# Patient Record
Sex: Male | Born: 1985 | Race: Black or African American | Hispanic: No | Marital: Married | Smoking: Never smoker
Health system: Southern US, Community
[De-identification: ages and names within clinical notes are randomized; demographics above are authoritative.]

## PROBLEM LIST (undated history)

## (undated) DIAGNOSIS — E7800 Pure hypercholesterolemia, unspecified: Secondary | ICD-10-CM

## (undated) DIAGNOSIS — E119 Type 2 diabetes mellitus without complications: Secondary | ICD-10-CM

## (undated) DIAGNOSIS — I2510 Atherosclerotic heart disease of native coronary artery without angina pectoris: Secondary | ICD-10-CM

## (undated) DIAGNOSIS — I10 Essential (primary) hypertension: Secondary | ICD-10-CM

---
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
## 2010-06-19 ENCOUNTER — Inpatient Hospital Stay (HOSPITAL_COMMUNITY)
Admission: EM | Admit: 2010-06-19 | Discharge: 2010-06-21 | Payer: Self-pay | Source: Home / Self Care | Attending: Internal Medicine | Admitting: Internal Medicine

## 2010-06-20 ENCOUNTER — Encounter (INDEPENDENT_AMBULATORY_CARE_PROVIDER_SITE_OTHER): Payer: Self-pay | Admitting: Internal Medicine

## 2010-06-29 LAB — HEPATIC FUNCTION PANEL
ALT: 28 U/L (ref 0–53)
AST: 36 U/L (ref 0–37)
Albumin: 3.9 g/dL (ref 3.5–5.2)
Alkaline Phosphatase: 77 U/L (ref 39–117)
Bilirubin, Direct: 0.3 mg/dL (ref 0.0–0.3)
Indirect Bilirubin: 0.8 mg/dL (ref 0.3–0.9)
Total Bilirubin: 1.1 mg/dL (ref 0.3–1.2)
Total Protein: 6.7 g/dL (ref 6.0–8.3)

## 2010-06-29 LAB — POCT I-STAT, CHEM 8
BUN: 22 mg/dL (ref 6–23)
Calcium, Ion: 1.22 mmol/L (ref 1.12–1.32)
Chloride: 109 mEq/L (ref 96–112)
Creatinine, Ser: 0.8 mg/dL (ref 0.4–1.5)
Glucose, Bld: 119 mg/dL — ABNORMAL HIGH (ref 70–99)
HCT: 41 % (ref 39.0–52.0)
Hemoglobin: 13.9 g/dL (ref 13.0–17.0)
Potassium: 3.9 mEq/L (ref 3.5–5.1)
Sodium: 141 mEq/L (ref 135–145)
TCO2: 29 mmol/L (ref 0–100)

## 2010-06-29 LAB — CBC
HCT: 39 % (ref 39.0–52.0)
HCT: 41.2 % (ref 39.0–52.0)
Hemoglobin: 13.3 g/dL (ref 13.0–17.0)
Hemoglobin: 13.6 g/dL (ref 13.0–17.0)
MCH: 27.8 pg (ref 26.0–34.0)
MCH: 28.7 pg (ref 26.0–34.0)
MCHC: 33 g/dL (ref 30.0–36.0)
MCHC: 34.1 g/dL (ref 30.0–36.0)
MCV: 84.2 fL (ref 78.0–100.0)
MCV: 84.3 fL (ref 78.0–100.0)
Platelets: 177 10*3/uL (ref 150–400)
Platelets: 194 10*3/uL (ref 150–400)
RBC: 4.63 MIL/uL (ref 4.22–5.81)
RBC: 4.89 MIL/uL (ref 4.22–5.81)
RDW: 13 % (ref 11.5–15.5)
RDW: 13 % (ref 11.5–15.5)
WBC: 7.8 10*3/uL (ref 4.0–10.5)
WBC: 8.2 10*3/uL (ref 4.0–10.5)

## 2010-06-29 LAB — COMPREHENSIVE METABOLIC PANEL
ALT: 25 U/L (ref 0–53)
ALT: 24 U/L (ref 0–53)
AST: 24 U/L (ref 0–37)
AST: 22 U/L (ref 0–37)
Albumin: 3.9 g/dL (ref 3.5–5.2)
Albumin: 3.3 g/dL — ABNORMAL LOW (ref 3.5–5.2)
Alkaline Phosphatase: 66 U/L (ref 39–117)
Alkaline Phosphatase: 66 U/L (ref 39–117)
BUN: 20 mg/dL (ref 6–23)
BUN: 15 mg/dL (ref 6–23)
CO2: 25 mEq/L (ref 19–32)
CO2: 26 mEq/L (ref 19–32)
Calcium: 9.1 mg/dL (ref 8.4–10.5)
Calcium: 9.3 mg/dL (ref 8.4–10.5)
Chloride: 104 mEq/L (ref 96–112)
Chloride: 106 mEq/L (ref 96–112)
Creatinine, Ser: 0.8 mg/dL (ref 0.4–1.5)
Creatinine, Ser: 0.8 mg/dL (ref 0.4–1.5)
GFR calc Af Amer: >60 mL/min (ref >60)
GFR calc Af Amer: >60 mL/min (ref >60)
GFR calc non Af Amer: >60 mL/min (ref >60)
GFR calc non Af Amer: >60 mL/min (ref >60)
Glucose, Bld: 103 mg/dL — ABNORMAL HIGH (ref 70–99)
Glucose, Bld: 140 mg/dL — ABNORMAL HIGH (ref 70–99)
Potassium: 4.1 mEq/L (ref 3.5–5.1)
Potassium: 4 mEq/L (ref 3.5–5.1)
Sodium: 138 mEq/L (ref 135–145)
Sodium: 140 mEq/L (ref 135–145)
Total Bilirubin: 0.5 mg/dL (ref 0.3–1.2)
Total Bilirubin: 1.1 mg/dL (ref 0.3–1.2)
Total Protein: 6.9 g/dL (ref 6.0–8.3)
Total Protein: 6.2 g/dL (ref 6.0–8.3)

## 2010-06-29 LAB — DIFFERENTIAL
Basophils Absolute: 0 10*3/uL (ref 0.0–0.1)
Basophils Relative: 1 % (ref 0–1)
Eosinophils Absolute: 0.1 10*3/uL (ref 0.0–0.7)
Eosinophils Relative: 1 % (ref 0–5)
Lymphocytes Relative: 38 % (ref 12–46)
Lymphs Abs: 3.1 10*3/uL (ref 0.7–4.0)
Monocytes Absolute: 0.7 10*3/uL (ref 0.1–1.0)
Monocytes Relative: 9 % (ref 3–12)
Neutro Abs: 4.2 10*3/uL (ref 1.7–7.7)
Neutrophils Relative %: 52 % (ref 43–77)

## 2010-06-29 LAB — LIPID PANEL
Cholesterol: 213 mg/dL — ABNORMAL HIGH (ref 0–200)
HDL: 30 mg/dL — ABNORMAL LOW (ref >39)
LDL Cholesterol: UNDETERMINED mg/dL (ref 0–99)
Total CHOL/HDL Ratio: 7.1 RATIO
Triglycerides: 535 mg/dL — ABNORMAL HIGH (ref <150)
VLDL: UNDETERMINED mg/dL (ref 0–40)

## 2010-06-29 LAB — POCT CARDIAC MARKERS
CKMB, poc: 3.6 ng/mL (ref 1.0–8.0)
Myoglobin, poc: 38.1 ng/mL (ref 12–200)
Troponin i, poc: <0.05 ng/mL (ref 0.00–0.09)

## 2010-06-29 LAB — CK TOTAL AND CKMB (NOT AT ARMC)
CK, MB: 5.5 ng/mL — ABNORMAL HIGH (ref 0.3–4.0)
CK, MB: 4.5 ng/mL — ABNORMAL HIGH (ref 0.3–4.0)
CK, MB: 4 ng/mL (ref 0.3–4.0)
Relative Index: 3.2 — ABNORMAL HIGH (ref 0.0–2.5)
Relative Index: 3.3 — ABNORMAL HIGH (ref 0.0–2.5)
Relative Index: 3.3 — ABNORMAL HIGH (ref 0.0–2.5)
Total CK: 173 U/L (ref 7–232)
Total CK: 138 U/L (ref 7–232)
Total CK: 120 U/L (ref 7–232)

## 2010-06-29 LAB — GLUCOSE, CAPILLARY
Glucose-Capillary: 123 mg/dL — ABNORMAL HIGH (ref 70–99)
Glucose-Capillary: 138 mg/dL — ABNORMAL HIGH (ref 70–99)
Glucose-Capillary: 151 mg/dL — ABNORMAL HIGH (ref 70–99)
Glucose-Capillary: 163 mg/dL — ABNORMAL HIGH (ref 70–99)
Glucose-Capillary: 199 mg/dL — ABNORMAL HIGH (ref 70–99)
Glucose-Capillary: 135 mg/dL — ABNORMAL HIGH (ref 70–99)

## 2010-06-29 LAB — TROPONIN I
Troponin I: 0.02 ng/mL (ref 0.00–0.06)
Troponin I: 0.02 ng/mL (ref 0.00–0.06)
Troponin I: 0.03 ng/mL (ref 0.00–0.06)

## 2010-06-29 LAB — RAPID URINE DRUG SCREEN, HOSP PERFORMED
Amphetamines: NOT DETECTED
Barbiturates: NOT DETECTED
Benzodiazepines: NOT DETECTED
Cocaine: NOT DETECTED
Opiates: NOT DETECTED
Tetrahydrocannabinol: NOT DETECTED

## 2010-06-29 LAB — ETHANOL: Alcohol, Ethyl (B): <5 mg/dL (ref 0–10)

## 2010-06-29 LAB — PROTIME-INR
INR: 0.99 (ref 0.00–1.49)
Prothrombin Time: 13.3 seconds (ref 11.6–15.2)

## 2010-06-29 LAB — D-DIMER, QUANTITATIVE: D-Dimer, Quant: <0.22 ug/mL-FEU (ref 0.00–0.48)

## 2010-06-29 LAB — APTT: aPTT: 28 seconds (ref 24–37)

## 2010-07-03 NOTE — Discharge Summary (Signed)
  [name], KLINGERMAN NO.:  [id number]  MEDICAL RECORD NO.:  [id number]          PATIENT TYPE:  INP  LOCATION:  2011                         FACILITY:  [facility]  PHYSICIAN:  Sarajane Jews, MD     DATE OF BIRTH:  12/13/1985  DATE OF ADMISSION:  06/19/2010 DATE OF DISCHARGE:  06/21/2010                              DISCHARGE SUMMARY   PRIMARY CARE PHYSICIAN:  Unassigned  REASON FOR ADMISSION:  Chest pain.  DISCHARGE DIAGNOSES: 1. Chest pain, myocardial infarction was ruled out. 2. Obesity. 3. Hypercholesterolemia. 4. Hypertriglyceridemia. 5. Diabetes type 2.  DISCHARGE MEDICATION: 1. Aspirin 81 mg daily. 2. TriCor 145 mg daily. 3. Simvastatin 20 daily. 4. Metformin 500 p.o. b.i.d. 5. Glipizide 10 mg daily.  RADIOLOGY:  Chest x-ray, no active lung disease.  Two-D echo shows left ventricular cavity size was normal.  There was mild concentric hypertrophy.  Systolic function was normal, 50-65%.  Aortic valve is a normal valve.  Aorta was normal.  CONSULTS:  Cardiology consult was done with Dr. Jana Half. DeGent.  HOSPITAL COURSE:  This is a 25 year old male with history of diabetes type 2 who came with left-sided chest pain that started around 7-7:30 on the day of admission, lasted for 4 hours.  The patient was given nitro. EKG was normal sinus rhythm.  Cardiac enzymes x3 were negative.  Drug screen was negative.  The patient does have a family history that his father had coronary artery bypass at 67.  The patient was admitted to rule out.  He was seen by Cardiology.  He refused to stay one more day for inpatient stress test.  The patient was discharged home with outpatient stress test.  PERTINENT LABORATORY DATA:  His sodium was 140, potassium was 4, chloride 106, bicarb 76, and glucose 140.  Cholesterol is 213, triglycerides 535.  HDL was 30.  Urine screen was negative.  Alcohol was less than 5.  DIET:  Two-gram sodium.  DISPOSITION:   Home.  INSTRUCTIONS:  Followup stress test as an outpatient.          ______________________________ Sarajane Jews, MD     SA/MEDQ  D:  06/21/2010  T:  06/22/2010  Job:  [id number]  Electronically Signed by Sarajane Jews MD on 07/03/2010 11:49:44 AM

## 2010-07-10 NOTE — H&P (Signed)
[name], SHILL NO.:  [id number]  MEDICAL RECORD NO.:  [id number]          PATIENT TYPE:  INP  LOCATION:  [place]                         FACILITY:  [facility]  PHYSICIAN:  Arlyss Repress, MD        DATE OF BIRTH:  1986/02/11  DATE OF ADMISSION:  06/19/2010 DATE OF DISCHARGE:                             HISTORY & PHYSICAL   CHIEF COMPLAINT:  Chest pain.  HISTORY OF PRESENT ILLNESS:  A 25 year old male with a history of diabetes type 1, apparently complains of chest pain that started about 7 p.m.  He described the pain as tightness with radiation to the left arm associated with palpitations and shortness of breath.  The patient denies any fever, chills, cough, nausea, vomiting, heartburn, diarrhea. The patient has no prior history of chest pain.  Pain self went away after several hours.  We were asked by the ED to evaluate the patient for his chest pain.  In light of the fact that he does have a family history of heart disease in his father who had bypass surgery at age 35, and was a nonsmoker.  The patient, however, is not certain that he is willing to stay.  EKG showed normal sinus rhythm at 98, normal axis, poor R-wave progression, nonspecific T-wave changes.  There was no prior EKG for comparison.  Admission orders returned in; however, I am not sure if the patient is going to stay as stated above.  PAST MEDICAL HISTORY:  Type 1 diabetes.  PAST SURGICAL HISTORY:  None.  SOCIAL HISTORY:  The patient is married and has children.  He does not smoke or drink.  He does not do drugs.  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  FAMILY HISTORY:  Mother is alive at age 27 and healthy.  Father is alive at age 67 and has a history of bypass surgery at age 47 and is a nonsmoker.  ALLERGIES:  No known drug allergies.  MEDICATIONS:  Glipizide and metformin.  REVIEW OF SYSTEMS:  Negative for all 10-organ systems except for pertinent positives as stated  above.  PHYSICAL EXAMINATION:  VITAL SIGNS:  Temperature 98.1, pulse 101, blood pressure is 140/92, pulse ox is 100% on room air. HEENT:  Anicteric, EOMI, no nystagmus, pupils 1.5 mm, symmetric, direct consensual, near reflex intact.  Mucous membranes moist. NECK:  No JVD.  No bruit. HEART:  Regular rate and rhythm.  S1-S2.  No murmurs, gallops or rubs. LUNGS:  Clear to auscultation bilaterally. ABDOMEN:  Soft, nontender, nondistended.  Positive bowel sounds. EXTREMITIES:  No cyanosis, clubbing or edema. SKIN:  No rashes. LYMPH NODES:  No adenopathy. NEUROLOGIC:  Nonfocal, cranial nerves II through XII intact.  Reflexes 2+, symmetric, diffuse with downgoing toes bilaterally, motor strength 5/5 in all four extremities, pinprick intact.  LABORATORY DATA:  BUN 22, creatinine 0.8.  WBC 8.2, hemoglobin 13.6, platelet count 194.  Troponin-I less than 0.05.  Urine drug screen negative.  Alcohol level less than 5.  AST 36, ALT 28.  INR 0.99.  PTT 28.  Chest x-ray negative for any acute process.  ASSESSMENT AND PLAN: 1. Chest pain:  We will check a D-dimer since he was initially     somewhat tachycardic and if positive, then obtain a CTA of chest.     Otherwise, we will cycle cardiac markers q.6 h. x3 sets and     consider stress testing in a.m. either as an inpatient or as an     outpatient. 2. Type 1 diabetes:  Check fingerstick blood sugars a.c. and at     bedtime, cover with NovoLog sensitive sliding scale. 3. Deep venous thrombosis prophylaxis.  Lovenox.     Arlyss Repress, MD     JYK/MEDQ  D:  06/20/2010  T:  06/20/2010  Job:  [id number]  Electronically Signed by Jani Gravel MD on 07/10/2010 11:53:36 PM

## 2010-08-13 NOTE — Consult Note (Signed)
[name], [name] NO.:  [id number]  MEDICAL RECORD NO.:  [id number]          PATIENT TYPE:  INP  LOCATION:  2011                         FACILITY:  [facility]  PHYSICIAN:  Ernestine Mcmurray, MD,FACC DATE OF BIRTH:  Nov 14, 1985  DATE OF CONSULTATION:  06/20/2010 DATE OF DISCHARGE:                                CONSULTATION   REASON FOR CONSULTATION:  Evaluation of chest pain in a 25 year old male with diabetes mellitus type 2.  HISTORY OF PRESENT ILLNESS:  The patient is a 25 year old male with history of diabetes type 2 on glipizide and metformin.  The patient has a strong family history of coronary artery disease.  The patient yesterday reported an episode while driving of severe substernal chest pain.  He states the pain started around 7 or 7:30 and lasted for 4 hours until he came to the emergency room.  He was not given nitroglycerin in the emergency room, although his electrocardiogram was within normal limits. He also stated that the pain also somewhat radiating into the left arm. Again, the pain resolved spontaneously after 4 hours and there were no associated EKG changes per report of the H and P on this patient, the EKG is not available in the chart.  The patient states that several days ago he played basketball with his friends and had no exertional chest pain.  Today, he has been essentially pain free, although there have been a couple of episodes where he reports a 1/10 chest pressure, although not on exertion.  He has been able to walk in the halls without any difficulties.  The patient's cardiac enzymes have been within normal limits, although the third set is still pending.  The relative index was slightly elevated.  Troponins were negative x2 at 0.02.  The patient's drug screen was also negative.  The patient as outlined above does have a strong family history of coronary artery disease, his father had bypass at age 14.  PAST MEDICAL HISTORY:   Type 2 diabetes mellitus.  PAST SURGICAL HISTORY:  None.  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  Does not smoke or drink.  He does not use any drugs.  He is studying free biology.  FAMILY HISTORY:  Mother is alive at age 70 and healthy.  Father is alive at age 88 and has a history of bypass surgery at age 100, and is a nonsmoker.  ALLERGIES:  No known drug allergies.  MEDICATIONS:  Glipizide and metformin.  REVIEW OF SYSTEMS:  Pertinent positives as outlined above.  The remainder of the 18-point review of systems was otherwise negative.  PHYSICAL EXAMINATION:  VITAL SIGNS:  Blood pressure is 133/80 with a heart rate of 82 beats per minute, temperature is 97.5, saturation 98% on room air. GENERAL:  Well-nourished African American male in no distress. HEENT:  PERRLA.  EOMI.  Oropharynx is clear. NECK:  Supple.  Normal carotid upstroke bilaterally.  No carotid bruits or thyromegaly.  No nodular thyroid. LUNGS:  Clear breath sounds bilaterally. HEART:  Regular rate and rhythm, normal S1 and S2.  No  murmur, rubs, or gallops. ABDOMEN:  Soft, nontender.  No rebound or guarding.  Good bowel sounds. EXTREMITIES:  No cyanosis, clubbing, or edema. NEURO:  The patient is alert and oriented.  Grossly nonfocal. SKIN:  Warm and dry. VASCULAR:  2+ peripheral pulses.  Femoral pulses, dorsalis pedis, and posterior tibial pulses. LYMPHATICS:  Within normal limits. PSYCHIATRIC:  Normal affect.  LABORATORY WORK:  White count 7.8, hemoglobin 13.3, hematocrit 39, platelet count is 177.  Coagulation panel is within normal limits.  Sodium is 138, potassium is 4.1, chloride is 104, CO2 is 25, glucose is 103, BUN is 20, creatinine is 0.8.  Liver function tests are within normal limits.  Troponins as outlined above.  12-lead echocardiogram not in chart, but confirming no acute ischemic changes.  PROBLEMS: 1. Substernal chest pain with typical and atypical features. 2.  Multiple cardiac risk factors including,     a.     Strong family history of coronary artery disease.     b.     Type 2 diabetes mellitus.     c.     Negative cardiac enzyme markers and negative EKG.     d.     Unknown lipid status.  PLAN: 1. The patient's chest pain is somewhat concerning because there are     some typical features including the heavy substernal chest pain,     although it lasted for 4 hours and there were no troponin     elevation.  There was some radiation to the left arm.  The     patient's pretest probability is increased, but he is still a     relatively young at age 35. 71. At this point, I do think that the patient likelihood of     significant coronary artery disease is intermediate.  He would     benefit from stress testing and I have ordered a stress Myoview     study for tomorrow.  Apparently the Hospitalist Service has already     ordered an echocardiogram and we will review this later. 3. The patient needs aggressive risk factor modification and if that     his lipid status determined and could benefit     from statin known drug therapy pending the results. 4. The patient is not willing to stay, but I told him that it would be     important that we get the stress test done as an inpatient rather     than as an outpatient.     Ernestine Mcmurray, MD,FACC     GED/MEDQ  D:  06/20/2010  T:  06/21/2010  Job:  [id number]  cc:   Arlyss Repress, MD  Electronically Signed by Terald Sleeper MDFACC on 08/13/2010 10:25:45 AM

## 2010-10-30 NOTE — Discharge Summary (Signed)
[place]. [facility]  Patient:    [name], [name]                         MRN: [id number] Adm. Date:  [id number] Disc. Date: [id number] Attending:  Zigmund Gottron DictatorBenjamine Mola CC:         Dr. at [facility] along with H. pylori test fax # [phone]  Earnstine Regal, M.D.   Discharge Summary  DATE OF BIRTH:  February 07, 1961  PRIMARY DIAGNOSIS:  Chronic pancreatitis.  SECONDARY DIAGNOSES: 1. Pancreatic pseudocyst. 2. Hematemesis. 3. Tobacco abuse. 4. History of peptic ulcer disease. 5. Dehydration. 6. Malnutrition.  HISTORY OF PRESENT ILLNESS:  This 25 year old African-American male with a history of chronic pancreatitis and pancreatic pseudocyst presents complaining of severe abdominal pain on the evening of admission.  The patient was using Percocet 2.5/325 dose at home, and taking two pills every six to eight hours which was not relieving the pain.  The patient states that he has appointment with Dr. Harlow Asa and is to be considering surgery to be scheduled at Dr. Tera Helper recommendation dependent on a CT scan which was scheduled for March 22, 2000.  HOSPITAL COURSE:  The patient was admitted with presumptive acute pancreatitis, although amylase and lipase were in the normal range.  The patient had pain control with morphine PCA to which he responded well.  The patient did have some itching with the morphine, however, Benadryl relieved this itching.  Also on admission the patient was found to be dehydrated.  He was given IV fluids to improve this status, and started a clear liquid diet on day 3 of hospitalization, and progressed to taking p.o. well on day of discharge.  The patient had also presented with a history of peptic ulcer disease, and complained of hematemesis x 1 prior to his presentation to the ER.  The patient was treated with Phenergan and kept n.p.o., and had no further vomiting or hematemesis during hospitalization.   The patient was also given Protonix at 40 mg IV b.i.d.  The patient was found to have chronic malnutrition with a prealbumin of 10.4, albumin of 2.9.  Ensure supplements were suggested and given b.i.d. while he was in the hospital.  CONSULTATIONS:  General surgery, one of the partners of Dr. Harlow Asa consulted during this hospitalization as Dr. Harlow Asa (his primary surgeon) was on vacation.  He repeated the CT scan to evaluate pancreatic pseudocyst.  SIGNIFICANT LABORATORY DATA:  Decreased albumin and prealbumin as mentioned above.  PROCEDURES:  CT scan with a pancreatic pseudocyst size 8 x 13.7 cm, which has reportedly increased in size compared to the previous CT scan in the middle of August.  CONDITION ON DISCHARGE:  The patient was discharged home in a stable condition.  DISCHARGE INSTRUCTIONS: 1. The patient is instructed to follow up with primary doctor, Dr. ______ at    [facility] on Thursday, March 17, 2000, at 2 p.m. 2. Follow up with Dr. Harlow Asa on March 22, 2000, as scheduled.  DISCHARGE MEDICATIONS: 1. Percocet 7.5/500 one or two pills q.6-8h. p.r.n. pain. 2. Prilosec as used at home 20 mg p.o. q.d. 3. Phenergan 12.5 mg p.o. q.4-6h. p.r.n. nausea.  DIET:  Instructions to supplement with Ensure t.i.d.  LABORATORIES PENDING AT THIS TIME:  Serum H. pylori screen. DD:  03/16/00 TD:  03/17/00 Job: [id number]

## 2010-10-30 NOTE — Discharge Summary (Signed)
[place]. [facility]  Patient:    [name], [name]                         MRN: [id number] Adm. Date:  [id number] Disc. Date: [id number] Attending:  Zigmund Gottron Dictator:   Oleh Genin, D.O.                           Discharge Summary  CHIEF COMPLAINT:  Sent for high blood sugars.  REFERRING PHYSICIAN:  Unassigned.  The patient came from [facility].  HISTORY OF PRESENT ILLNESS:  The patient is a 25 year old black male sent at the request of Dr. Rosana Hoes of [facility] after discovering that the patients blood sugars were extremely high.  The patient had presented to [facility] on April 11, 2000, with a chief complaint of polydipsia, polyuria, and fatigue for two to three weeks.  The patient had previously been health without any medical problems.  The patient denies any weight loss or weight gain recently.  The patient denies night sweats and fevers.  Immunizations are up to date.  PAST MEDICAL HISTORY:  None.  PAST SURGICAL HISTORY:  None.  ALLERGIES:  No known drug allergies.  MEDICATIONS:  None.  ______  No over-the-counter medications.  FAMILY HISTORY:  Paternal grandfather died at 60 with diabetes.  Paternal grandmother with depression.  Maternal grandfather with high cholesterol. Maternal grandmother with no problems.  SOCIAL HISTORY:  Eleventh grade.  Lives with father and uncle.  Taking AP courses.  B average.  No smoking.  No drinking.  No recent unprotected sex.  REVIEW OF SYSTEMS:  Positive fevers.  Unsure about chills.  No night sweats. Positive headaches, bilateral frontal every day.  The patient also has visual problems x 6 months.  Problems at drivers license exam.  Patient with current cold symptoms.  The patient also has complaint of build up in the back of throat and a burning sensation in the chest.  No shortness of breath.  No nausea or vomiting.  Positive diarrhea yesterday on  two occasions.  No joint pain.  Positive generalized weakness.  PHYSICAL EXAMINATION:  Temperature 99.6 degrees, heart rate 101, blood pressure 152/88, respirations 24, oxygen saturation 99%.  GENERAL APPEARANCE:  A well-nourished, well-developed, 25 year old, obese, black male, alert and oriented, and in no acute distress.  HEENT:  Normocephalic and atraumatic.  PERRLA.  EOMI.  The funduscopic exam revealed no palpable edema.  Tympanic membranes gray and shiny bilaterally. Erythematous mucosa and greenish discharge.  OP benign.  NECK:  No lymphadenopathy.  No thyromegaly.  Hyperpigmented patch at the base of the neck spanning the posterior aspect.  Velvety in appearance.  CARDIOVASCULAR:  Regular rate and rhythm.  A 1/6 systolic ejection murmur.  No rubs or gallops.  RESPIRATIONS:  Clear to auscultation bilaterally.  No egophony.  ABDOMEN:  Soft, nontender, and nondistended.  Normoactive bowel sounds.  No hepatosplenomegaly.  EXTREMITIES:  No clubbing, cyanosis, or edema.  NEUROLOGIC:  Cranial nerves II-XII grossly intact.  No sensory or motor deficit noted.  Motor 5/5.  DTRs 2/2 and symmetric.  GENITOURINARY:  Tanners stage IV-V, otherwise normal uncircumcised male.  RECTAL:  Not performed.  LABORATORIES:  Hemoglobin A1C 11.6, done at [facility]. Sodium 134, potassium 3.7, chloride 99. CO2 22, BUN 14, creatinine 1.0, glucose 404.  WBC 12.6, hemoglobin 13.9, hematocrit 39.1, platelets 303, MCV 81, MCHC  35.5, RDW 12.2.  UA:  The SG is greater 1.040, glucose greater than 1000, ketones greater than 80, leukocyte esterase negative.  Total protein 7.7, albumin 4.5, AST 63, ALT 104.  ABGs 7.391/38.145/23.0, which was probably a venous stick.  Serum osmolarity 306.  HOSPITAL COURSE: #1 - HYPERGLYCEMIA:  The patient is likely with diabetes type 2 MODY given nonketotic hyperosmolar presentation.  The patient was admitted to initially bring down glucose levels  attempted with hydration alone.  It was felt that the patient was very likely a candidate for control with oral antiglycemics. The patient was begun on sliding scale insulin.  A great deal of diabetic education was given.  It was felt that the ideal oral agent for this obese patient would be Glucophage.  The patient was begun on Glucophage and tolerated the above well.  Blood sugars continued to run in the 200s-270s.  On hospital day #3, the patient was strongly felt to be type 2 diabetes of early onset.  He tolerated two days of Glucophage in house.  Taught how to self-administer insulin.  In discussion with the M.D.s, it was felt that the patient was a good candidate for Lantus.  He will be sent home with Lantus Insulin 30 units q.h.s. and Glucophage 500 mg twice a day.  To help with this medicine regimen is weight loss and a diabetic diet to be able to eventually be off of the insulin and be on a higher dose of Glucophage, eventually being able to taper down on the Glucophage as the patient loses weight and becomes less insulin resistant.  #2 - DEHYDRATION:  The patient resolved well with IV fluids.  #3 - OBESITY:  The patient was strongly advised on how and the reasons why to lose weight.  This will be closely followed up in the outpatient setting.  DISCHARGE MEDICATIONS:  Home medications include Lantus Insulin 30 units q.h.s. and Glucophage 500 mg b.i.d.  ACTIVITY:  The patient is encouraged to exercise.  DIET:  Diabetic diet.  WOUND CARE:  The patient is instructed to keep clean, dry, loose gauze on the leg sore and watch for redness and swelling.  FOLLOW-UP:  The patient is to follow up with Oleh Genin, D.O., at the family practice center.  Given the telephone number of the office and appointment with the diabetes management center on March 23, 2000, at 2 p.m. He is to bring his blood sugar log. DD:  05/20/00 TD:  05/20/00 Job: [id number]

## 2014-08-10 ENCOUNTER — Emergency Department (HOSPITAL_COMMUNITY)
Admission: EM | Admit: 2014-08-10 | Discharge: 2014-08-10 | Disposition: A | Discharge status: Home / Self Care | Payer: 59 | Attending: Emergency Medicine | Admitting: Emergency Medicine

## 2014-08-10 ENCOUNTER — Encounter (HOSPITAL_COMMUNITY): Payer: Self-pay | Admitting: Emergency Medicine

## 2014-08-10 ENCOUNTER — Emergency Department (HOSPITAL_COMMUNITY): Payer: 59

## 2014-08-10 DIAGNOSIS — R42 Dizziness and giddiness: Secondary | ICD-10-CM | POA: Diagnosis not present

## 2014-08-10 DIAGNOSIS — E669 Obesity, unspecified: Secondary | ICD-10-CM | POA: Insufficient documentation

## 2014-08-10 DIAGNOSIS — R55 Syncope and collapse: Secondary | ICD-10-CM | POA: Insufficient documentation

## 2014-08-10 DIAGNOSIS — R61 Generalized hyperhidrosis: Secondary | ICD-10-CM | POA: Diagnosis not present

## 2014-08-10 DIAGNOSIS — E119 Type 2 diabetes mellitus without complications: Secondary | ICD-10-CM | POA: Diagnosis not present

## 2014-08-10 DIAGNOSIS — R197 Diarrhea, unspecified: Secondary | ICD-10-CM | POA: Diagnosis not present

## 2014-08-10 DIAGNOSIS — R079 Chest pain, unspecified: Secondary | ICD-10-CM | POA: Diagnosis not present

## 2014-08-10 DIAGNOSIS — M79602 Pain in left arm: Secondary | ICD-10-CM | POA: Diagnosis not present

## 2014-08-10 HISTORY — DX: Type 2 diabetes mellitus without complications: E11.9

## 2014-08-10 LAB — CBC WITH DIFFERENTIAL/PLATELET
Basophils Absolute: 0 10*3/uL (ref 0.0–0.1)
Basophils Relative: 0 % (ref 0–1)
Eosinophils Absolute: 0.1 10*3/uL (ref 0.0–0.7)
Eosinophils Relative: 2 % (ref 0–5)
HCT: 38 % — ABNORMAL LOW (ref 39.0–52.0)
Hemoglobin: 12.6 g/dL — ABNORMAL LOW (ref 13.0–17.0)
Lymphocytes Relative: 40 % (ref 12–46)
Lymphs Abs: 3.1 10*3/uL (ref 0.7–4.0)
MCH: 27.9 pg (ref 26.0–34.0)
MCHC: 33.2 g/dL (ref 30.0–36.0)
MCV: 84.3 fL (ref 78.0–100.0)
Monocytes Absolute: 0.6 10*3/uL (ref 0.1–1.0)
Monocytes Relative: 8 % (ref 3–12)
Neutro Abs: 3.8 10*3/uL (ref 1.7–7.7)
Neutrophils Relative %: 50 % (ref 43–77)
Platelets: 225 10*3/uL (ref 150–400)
RBC: 4.51 MIL/uL (ref 4.22–5.81)
RDW: 12.8 % (ref 11.5–15.5)
WBC: 7.7 10*3/uL (ref 4.0–10.5)

## 2014-08-10 LAB — D-DIMER, QUANTITATIVE: D-Dimer, Quant: 0.31 ug/mL-FEU (ref 0.00–0.48)

## 2014-08-10 LAB — BASIC METABOLIC PANEL
Anion gap: 8 (ref 5–15)
BUN: 22 mg/dL (ref 6–23)
CO2: 27 mmol/L (ref 19–32)
Calcium: 9.2 mg/dL (ref 8.4–10.5)
Chloride: 105 mmol/L (ref 96–112)
Creatinine, Ser: 1.14 mg/dL (ref 0.50–1.35)
GFR calc Af Amer: >90 mL/min (ref >90)
GFR calc non Af Amer: 84 mL/min — ABNORMAL LOW (ref >90)
Glucose, Bld: 122 mg/dL — ABNORMAL HIGH (ref 70–99)
Potassium: 3.6 mmol/L (ref 3.5–5.1)
Sodium: 140 mmol/L (ref 135–145)

## 2014-08-10 LAB — I-STAT TROPONIN, ED
Troponin i, poc: 0 ng/mL (ref 0.00–0.08)
Troponin i, poc: 0 ng/mL (ref 0.00–0.08)

## 2014-08-10 LAB — CBG MONITORING, ED: Glucose-Capillary: 118 mg/dL — ABNORMAL HIGH (ref 70–99)

## 2014-08-10 LAB — BRAIN NATRIURETIC PEPTIDE: B Natriuretic Peptide: 18.2 pg/mL (ref 0.0–100.0)

## 2014-08-10 MED ORDER — SODIUM CHLORIDE 0.9 % IV BOLUS (SEPSIS)
1000.0000 mL | Freq: Once | INTRAVENOUS | Status: AC
  Administered 2014-08-10: 1000 mL via INTRAVENOUS

## 2014-08-10 MED ORDER — KETOROLAC TROMETHAMINE 30 MG/ML IJ SOLN
30.0000 mg | Freq: Once | INTRAMUSCULAR | Status: AC
  Administered 2014-08-10: 30 mg via INTRAVENOUS
  Filled 2014-08-10: qty 1

## 2014-08-10 MED ORDER — ACETAMINOPHEN 500 MG PO TABS
1000.0000 mg | ORAL_TABLET | Freq: Once | ORAL | Status: AC
  Administered 2014-08-10: 1000 mg via ORAL
  Filled 2014-08-10: qty 2

## 2014-08-10 NOTE — ED Notes (Signed)
Pt. Stated, I stared having chest pain, dizziness and sweating for about a week.  Pt sweating at triage, and feels nauseated.

## 2014-08-10 NOTE — ED Provider Notes (Signed)
CSN: [id number]     Arrival date & time 08/10/14  1242 History   First MD Initiated Contact with Patient 08/10/14 1529     Chief Complaint  Patient presents with  . Chest Pain  . Dizziness  . Excessive Sweating     (Consider location/radiation/quality/duration/timing/severity/associated sxs/prior Treatment) HPI  29 year old male presents with chest pain and left arm pain for the past 1 week. He states his been constant, seemingly away when he goes to sleep but is in its he is awake the pain is back. He has a hard time describing what the pain feels like but states that is worse is been a 5/10. It is in his left chest. It does not radiate. Occasionally he feels arm pain from his elbow down. No numbness. Patient's pain does not worsen with inspiration, exertion, or twisting. No change in pain with position. Occasional leg pain but no leg swelling or asymmetric leg pain. Today he was at a birthday party and he felt acutely lightheaded when he was trying to walk outside and has felt like he's going to pass out since. CP did not increase since then. Has had diarrhea 4 times for past 24 hours. No vomiting. No abdominal pain.  Past Medical History  Diagnosis Date  . Diabetes mellitus without complication    No past surgical history on file. No family history on file. History  Substance Use Topics  . Smoking status: Never Smoker   . Smokeless tobacco: Not on file  . Alcohol Use: No    Review of Systems  Constitutional: Positive for diaphoresis. Negative for fever.  Respiratory: Negative for cough and shortness of breath.   Cardiovascular: Positive for chest pain. Negative for leg swelling.  Gastrointestinal: Positive for diarrhea. Negative for vomiting and abdominal pain.  Neurological: Positive for light-headedness. Negative for syncope.  All other systems reviewed and are negative.     Allergies  Review of patient's allergies indicates not on file.  Home Medications   Prior to  Admission medications   Not on File   BP 83/45 mmHg  Pulse 88  Temp(Src) 98.1 F (36.7 C) (Oral)  Resp 14  SpO2 100% Physical Exam  Constitutional: He is oriented to person, place, and time. He appears well-developed and well-nourished.  obese  HENT:  Head: Normocephalic and atraumatic.  Right Ear: External ear normal.  Left Ear: External ear normal.  Nose: Nose normal.  Eyes: Right eye exhibits no discharge. Left eye exhibits no discharge.  Neck: Neck supple.  Cardiovascular: Normal rate, regular rhythm, normal heart sounds and intact distal pulses.   Pulses:      Radial pulses are 2+ on the right side, and 2+ on the left side.  Pulmonary/Chest: Effort normal and breath sounds normal. He exhibits no tenderness.  Abdominal: Soft. He exhibits no distension. There is no tenderness.  Musculoskeletal: He exhibits no edema.  Neurological: He is alert and oriented to person, place, and time.  Skin: Skin is warm and dry.  Nursing note and vitals reviewed.   ED Course  Procedures (including critical care time) Labs Review Labs Reviewed  BASIC METABOLIC PANEL - Abnormal; Notable for the following:    Glucose, Bld 122 (*)    GFR calc non Af Amer 84 (*)    All other components within normal limits  CBC WITH DIFFERENTIAL/PLATELET - Abnormal; Notable for the following:    Hemoglobin 12.6 (*)    HCT 38.0 (*)    All other components within normal limits  CBG MONITORING, ED - Abnormal; Notable for the following:    Glucose-Capillary 118 (*)    All other components within normal limits  D-DIMER, QUANTITATIVE  BRAIN NATRIURETIC PEPTIDE  CBG MONITORING, ED  I-STAT TROPOININ, ED  Randolm Idol, ED    Imaging Review Dg Chest 2 View  08/10/2014   CLINICAL DATA:  Left chest pain, dizziness  EXAM: CHEST  2 VIEW  COMPARISON:  06/20/2010  FINDINGS: Lungs are clear.  No pleural effusion or pneumothorax.  The heart is normal in size.  Mild degenerative changes of the visualized  thoracolumbar spine.  IMPRESSION: Normal chest radiographs.   Electronically Signed   By: Julian Hy M.D.   On: 08/10/2014 16:29     EKG Interpretation   Date/Time:  Saturday August 10 2014 12:46:15 EST Ventricular Rate:  97 PR Interval:  146 QRS Duration: 96 QT Interval:  364 QTC Calculation: 462 R Axis:   64 Text Interpretation:  Normal sinus rhythm Nonspecific ST abnormality  Abnormal ECG No significant change since last tracing Confirmed by  Mercer Peifer  MD, Aaron Boeh ([id number]) on 08/10/2014 3:30:26 PM      MDM   Final diagnoses:  Chest pain, unspecified chest pain type  Near syncope    Unclear why patient had hypotension on arrival, but this was corrected with fluids and his dizziness resolved. There is no clear etiology for CP either, but with 2 negative troponins and an unchanged EKG since 2012, I doubt ACS. He is low risk for PE and has negative ddimer, and thus I feel PE is ruled out. No severe CP to suggest dissection. Diarrhea could have contributed to dehydration which could cause his low BP. Given that he feels better and has PCP f/u I feel he is stable for discharge. Encouraged increased PO intake and follow up with PCP as well as discussing strict return precautions.    Ephraim Hamburger, MD 08/10/14 [phone]

## 2014-08-10 NOTE — Discharge Instructions (Signed)
Chest Pain (Nonspecific) °It is often hard to give a specific diagnosis for the cause of chest pain. There is always a chance that your pain could be related to something serious, such as a heart attack or a blood clot in the lungs. You need to follow up with your health care provider for further evaluation. °CAUSES  °· Heartburn. °· Pneumonia or bronchitis. °· Anxiety or stress. °· Inflammation around your heart (pericarditis) or lung (pleuritis or pleurisy). °· A blood clot in the lung. °· A collapsed lung (pneumothorax). It can develop suddenly on its own (spontaneous pneumothorax) or from trauma to the chest. °· Shingles infection (herpes zoster virus). °The chest wall is composed of bones, muscles, and cartilage. Any of these can be the source of the pain. °· The bones can be bruised by injury. °· The muscles or cartilage can be strained by coughing or overwork. °· The cartilage can be affected by inflammation and become sore (costochondritis). °DIAGNOSIS  °Lab tests or other studies may be needed to find the cause of your pain. Your health care provider may have you take a test called an ambulatory electrocardiogram (ECG). An ECG records your heartbeat patterns over a 24-hour period. You may also have other tests, such as: °· Transthoracic echocardiogram (TTE). During echocardiography, sound waves are used to evaluate how blood flows through your heart. °· Transesophageal echocardiogram (TEE). °· Cardiac monitoring. This allows your health care provider to monitor your heart rate and rhythm in real time. °· Holter monitor. This is a portable device that records your heartbeat and can help diagnose heart arrhythmias. It allows your health care provider to track your heart activity for several days, if needed. °· Stress tests by exercise or by giving medicine that makes the heart beat faster. °TREATMENT  °· Treatment depends on what may be causing your chest pain. Treatment may include: °· Acid blockers for  heartburn. °· Anti-inflammatory medicine. °· Pain medicine for inflammatory conditions. °· Antibiotics if an infection is present. °· You may be advised to change lifestyle habits. This includes stopping smoking and avoiding alcohol, caffeine, and chocolate. °· You may be advised to keep your head raised (elevated) when sleeping. This reduces the chance of acid going backward from your stomach into your esophagus. °Most of the time, nonspecific chest pain will improve within 2-3 days with rest and mild pain medicine.  °HOME CARE INSTRUCTIONS  °· If antibiotics were prescribed, take them as directed. Finish them even if you start to feel better. °· For the next few days, avoid physical activities that bring on chest pain. Continue physical activities as directed. °· Do not use any tobacco products, including cigarettes, chewing tobacco, or electronic cigarettes. °· Avoid drinking alcohol. °· Only take medicine as directed by your health care provider. °· Follow your health care provider's suggestions for further testing if your chest pain does not go away. °· Keep any follow-up appointments you made. If you do not go to an appointment, you could develop lasting (chronic) problems with pain. If there is any problem keeping an appointment, call to reschedule. °SEEK MEDICAL CARE IF:  °· Your chest pain does not go away, even after treatment. °· You have a rash with blisters on your chest. °· You have a fever. °SEEK IMMEDIATE MEDICAL CARE IF:  °· You have increased chest pain or pain that spreads to your arm, neck, jaw, back, or abdomen. °· You have shortness of breath. °· You have an increasing cough, or you cough   up blood.  You have severe back or abdominal pain.  You feel nauseous or vomit.  You have severe weakness.  You faint.  You have chills. This is an emergency. Do not wait to see if the pain will go away. Get medical help at once. Call your local emergency services (911 in [place] Do not drive  yourself to the hospital. MAKE SURE YOU:   Understand these instructions.  Will watch your condition.  Will get help right away if you are not doing well or get worse. Document Released: 03/10/2005 Document Revised: 06/05/2013 Document Reviewed: 01/04/2008 [facility] Patient Information 2015 [place], [place]. This information is not intended to replace advice given to you by your health care provider. Make sure you discuss any questions you have with your health care provider.  Dizziness Dizziness is a common problem. It is a feeling of unsteadiness or light-headedness. You may feel like you are about to faint. Dizziness can lead to injury if you stumble or fall. A person of any age group can suffer from dizziness, but dizziness is more common in older adults. CAUSES  Dizziness can be caused by many different things, including:  Middle ear problems.  Standing for too long.  Infections.  An allergic reaction.  Aging.  An emotional response to something, such as the sight of blood.  Side effects of medicines.  Tiredness.  Problems with circulation or blood pressure.  Excessive use of alcohol or medicines, or illegal drug use.  Breathing too fast (hyperventilation).  An irregular heart rhythm (arrhythmia).  A low red blood cell count (anemia).  Pregnancy.  Vomiting, diarrhea, fever, or other illnesses that cause body fluid loss (dehydration).  Diseases or conditions such as Parkinson's disease, high blood pressure (hypertension), diabetes, and thyroid problems.  Exposure to extreme heat. DIAGNOSIS  Your health care provider will ask about your symptoms, perform a physical exam, and perform an electrocardiogram (ECG) to record the electrical activity of your heart. Your health care provider may also perform other heart or blood tests to determine the cause of your dizziness. These may include:  Transthoracic echocardiogram (TTE). During echocardiography, sound waves are  used to evaluate how blood flows through your heart.  Transesophageal echocardiogram (TEE).  Cardiac monitoring. This allows your health care provider to monitor your heart rate and rhythm in real time.  Holter monitor. This is a portable device that records your heartbeat and can help diagnose heart arrhythmias. It allows your health care provider to track your heart activity for several days if needed.  Stress tests by exercise or by giving medicine that makes the heart beat faster. TREATMENT  Treatment of dizziness depends on the cause of your symptoms and can vary greatly. HOME CARE INSTRUCTIONS   Drink enough fluids to keep your urine clear or pale yellow. This is especially important in very hot weather. In older adults, it is also important in cold weather.  Take your medicine exactly as directed if your dizziness is caused by medicines. When taking blood pressure medicines, it is especially important to get up slowly.  Rise slowly from chairs and steady yourself until you feel okay.  In the morning, first sit up on the side of the bed. When you feel okay, stand slowly while holding onto something until you know your balance is fine.  Move your legs often if you need to stand in one place for a long time. Tighten and relax your muscles in your legs while standing.  Have someone stay with  you for 1-2 days if dizziness continues to be a problem. Do this until you feel you are well enough to stay alone. Have the person call your health care provider if he or she notices changes in you that are concerning.  Do not drive or use heavy machinery if you feel dizzy.  Do not drink alcohol. SEEK IMMEDIATE MEDICAL CARE IF:   Your dizziness or light-headedness gets worse.  You feel nauseous or vomit.  You have problems talking, walking, or using your arms, hands, or legs.  You feel weak.  You are not thinking clearly or you have trouble forming sentences. It may take a friend or  family member to notice this.  You have chest pain, abdominal pain, shortness of breath, or sweating.  Your vision changes.  You notice any bleeding.  You have side effects from medicine that seems to be getting worse rather than better. MAKE SURE YOU:   Understand these instructions.  Will watch your condition.  Will get help right away if you are not doing well or get worse. Document Released: 11/24/2000 Document Revised: 06/05/2013 Document Reviewed: 12/18/2010 [facility] Patient Information 2015 [place], [place]. This information is not intended to replace advice given to you by your health care provider. Make sure you discuss any questions you have with your health care provider.    Dehydration, Adult Dehydration is when you lose more fluids from the body than you take in. Vital organs like the kidneys, brain, and heart cannot function without a proper amount of fluids and salt. Any loss of fluids from the body can cause dehydration.  CAUSES   Vomiting.  Diarrhea.  Excessive sweating.  Excessive urine output.  Fever. SYMPTOMS  Mild dehydration  Thirst.  Dry lips.  Slightly dry mouth. Moderate dehydration  Very dry mouth.  Sunken eyes.  Skin does not bounce back quickly when lightly pinched and released.  Dark urine and decreased urine production.  Decreased tear production.  Headache. Severe dehydration  Very dry mouth.  Extreme thirst.  Rapid, weak pulse (more than 100 beats per minute at rest).  Cold hands and feet.  Not able to sweat in spite of heat and temperature.  Rapid breathing.  Blue lips.  Confusion and lethargy.  Difficulty being awakened.  Minimal urine production.  No tears. DIAGNOSIS  Your caregiver will diagnose dehydration based on your symptoms and your exam. Blood and urine tests will help confirm the diagnosis. The diagnostic evaluation should also identify the cause of dehydration. TREATMENT  Treatment of mild or  moderate dehydration can often be done at home by increasing the amount of fluids that you drink. It is best to drink small amounts of fluid more often. Drinking too much at one time can make vomiting worse. Refer to the home care instructions below. Severe dehydration needs to be treated at the hospital where you will probably be given intravenous (IV) fluids that contain water and electrolytes. HOME CARE INSTRUCTIONS   Ask your caregiver about specific rehydration instructions.  Drink enough fluids to keep your urine clear or pale yellow.  Drink small amounts frequently if you have nausea and vomiting.  Eat as you normally do.  Avoid:  Foods or drinks high in sugar.  Carbonated drinks.  Juice.  Extremely hot or cold fluids.  Drinks with caffeine.  Fatty, greasy foods.  Alcohol.  Tobacco.  Overeating.  Gelatin desserts.  Wash your hands well to avoid spreading bacteria and viruses.  Only take over-the-counter or prescription medicines  for pain, discomfort, or fever as directed by your caregiver.  Ask your caregiver if you should continue all prescribed and over-the-counter medicines.  Keep all follow-up appointments with your caregiver. SEEK MEDICAL CARE IF:  You have abdominal pain and it increases or stays in one area (localizes).  You have a rash, stiff neck, or severe headache.  You are irritable, sleepy, or difficult to awaken.  You are weak, dizzy, or extremely thirsty. SEEK IMMEDIATE MEDICAL CARE IF:   You are unable to keep fluids down or you get worse despite treatment.  You have frequent episodes of vomiting or diarrhea.  You have blood or green matter (bile) in your vomit.  You have blood in your stool or your stool looks black and tarry.  You have not urinated in 6 to 8 hours, or you have only urinated a small amount of very dark urine.  You have a fever.  You faint. MAKE SURE YOU:   Understand these instructions.  Will watch your  condition.  Will get help right away if you are not doing well or get worse. Document Released: 05/31/2005 Document Revised: 08/23/2011 Document Reviewed: 01/18/2011 [facility] Patient Information 2015 [place], [place]. This information is not intended to replace advice given to you by your health care provider. Make sure you discuss any questions you have with your health care provider.

## 2014-08-15 ENCOUNTER — Ambulatory Visit (INDEPENDENT_AMBULATORY_CARE_PROVIDER_SITE_OTHER): Payer: 59 | Admitting: Endocrinology

## 2014-08-15 ENCOUNTER — Encounter: Payer: Self-pay | Admitting: Endocrinology

## 2014-08-15 VITALS — BP 134/94 | HR 91 | Temp 97.5°F | Ht 75.5 in | Wt 297.0 lb

## 2014-08-15 DIAGNOSIS — E1142 Type 2 diabetes mellitus with diabetic polyneuropathy: Secondary | ICD-10-CM

## 2014-08-15 DIAGNOSIS — E119 Type 2 diabetes mellitus without complications: Secondary | ICD-10-CM | POA: Insufficient documentation

## 2014-08-15 NOTE — Patient Instructions (Addendum)
good diet and exercise habits significanly improve the control of your diabetes.  please let me know if you wish to be referred to a dietician.  high blood sugar is very risky to your health.  you should see an eye doctor and dentist every year.  It is very important to get all recommended vaccinations.  controlling your blood pressure and cholesterol drastically reduces the damage diabetes does to your body.  Those who smoke should quit.  please discuss these with your doctor.  check your blood sugar twice a day.  vary the time of day when you check, between before the 3 meals, and at bedtime.  also check if you have symptoms of your blood sugar being too high or too low.  please keep a record of the readings and bring it to your next appointment here.  You can write it on any piece of paper.  please call us sooner if your blood sugar goes below 70, or if you have a lot of readings over 200.   Please see a weight loss surgery specialist.  you will receive a phone call, about a day and time for an appointment. Please increase the insulin to 50 units each morning. we will need to take this complex situation in stages Please come back for a follow-up appointment in 2 weeks.

## 2014-08-15 NOTE — Progress Notes (Signed)
Subjective:    Patient ID: [name], male    DOB: [birth year], 29 y.o.   MRN: [id number]  HPI pt states DM was dx'ed in 2003; he has mild neuropathy of the lower extremities; he is unaware of any associated chronic complications; he has been on insulin since soon after dx; pt says his diet and exercise are poor; he has never been a smoker; he has never had pancreatitis, severe hypoglycemia or DKA.  Pt says over the past 2 weeks, med compliance with insulin is much better.  He says cbg's vary from 200-300.   Past Medical History  Diagnosis Date  . Diabetes mellitus without complication     No past surgical history on file.  History   Social History  . Marital Status: Single    Spouse Name: N/A  . Number of Children: N/A  . Years of Education: N/A   Occupational History  . Not on file.   Social History Main Topics  . Smoking status: Never Smoker   . Smokeless tobacco: Not on file  . Alcohol Use: No  . Drug Use: No  . Sexual Activity: Not on file   Other Topics Concern  . Not on file   Social History Narrative    No current outpatient prescriptions on file prior to visit.   No current facility-administered medications on file prior to visit.    No Known Allergies  Family History  Problem Relation Age of Onset  . Diabetes Father     BP 134/94 mmHg  Pulse 91  Temp(Src) 97.5 F (36.4 C) (Oral)  Ht 6' 3.5" (1.918 m)  Wt 297 lb (134.718 kg)  BMI 36.62 kg/m2  SpO2 97%   Review of Systems denies weight loss, blurry vision, headache, sob, n/v, urinary frequency, muscle cramps, excessive diaphoresis, depression, cold intolerance, rhinorrhea, and easy bruising.  He recently had neg eval for chest pain.      Objective:   Physical Exam VS: see vs page GEN: no distress HEAD: head: no deformity eyes: no periorbital swelling, no proptosis external nose and ears are normal mouth: no lesion seen NECK: supple, thyroid is not enlarged CHEST WALL: no  deformity LUNGS: clear to auscultation BREASTS:  No gynecomastia CV: reg rate and rhythm, no murmur ABD: abdomen is soft, nontender.  no hepatosplenomegaly.  not distended.  no hernia MUSCULOSKELETAL: muscle bulk and strength are grossly normal.  no obvious joint swelling.  gait is normal and steady EXTEMITIES: no deformity.  no ulcer on the feet.  feet are of normal color and temp.  1+ bilat leg edema.  There is bilateral onychomycosis of the toenails PULSES: dorsalis pedis intact bilat.  no carotid bruit NEURO:  cn 2-12 grossly intact.   readily moves all 4's.  sensation is intact to touch on the feet, but decreased from normal.  SKIN:  Normal texture and temperature.  No rash or suspicious lesion is visible.   NODES:  None palpable at the neck PSYCH: alert, well-oriented.  Does not appear anxious nor depressed.   outside test results are reviewed: A1c=12.5    Assessment & Plan:  DM: severe exacerbation. Obesity, new. Noncompliance with insulin: much better now.     Patient is advised the following: Patient Instructions  good diet and exercise habits significanly improve the control of your diabetes.  please let me know if you wish to be referred to a dietician.  high blood sugar is very risky to your health.  you should see  an eye doctor and dentist every year.  It is very important to get all recommended vaccinations.  controlling your blood pressure and cholesterol drastically reduces the damage diabetes does to your body.  Those who smoke should quit.  please discuss these with your doctor.  check your blood sugar twice a day.  vary the time of day when you check, between before the 3 meals, and at bedtime.  also check if you have symptoms of your blood sugar being too high or too low.  please keep a record of the readings and bring it to your next appointment here.  You can write it on any piece of paper.  please call us sooner if your blood sugar goes below 70, or if you have a lot  of readings over 200.   Please see a weight loss surgery specialist.  you will receive a phone call, about a day and time for an appointment. Please increase the insulin to 50 units each morning. we will need to take this complex situation in stages Please come back for a follow-up appointment in 2 weeks.

## 2014-08-30 ENCOUNTER — Ambulatory Visit (INDEPENDENT_AMBULATORY_CARE_PROVIDER_SITE_OTHER): Payer: 59 | Admitting: Endocrinology

## 2014-08-30 ENCOUNTER — Encounter: Payer: Self-pay | Admitting: Endocrinology

## 2014-08-30 VITALS — BP 120/84 | HR 82 | Temp 98.6°F | Wt 302.2 lb

## 2014-08-30 DIAGNOSIS — E785 Hyperlipidemia, unspecified: Secondary | ICD-10-CM

## 2014-08-30 DIAGNOSIS — I10 Essential (primary) hypertension: Secondary | ICD-10-CM

## 2014-08-30 DIAGNOSIS — E1142 Type 2 diabetes mellitus with diabetic polyneuropathy: Secondary | ICD-10-CM

## 2014-08-30 NOTE — Progress Notes (Signed)
Pre visit review using our clinic review tool, if applicable. No additional management support is needed unless otherwise documented below in the visit note. 

## 2014-08-30 NOTE — Progress Notes (Signed)
   Subjective:    Patient ID: [name], male    DOB: Jan 17, 1986, 29 y.o.   MRN: [id number]  HPI  Pt returns for f/u of diabetes mellitus: DM type: Insulin-requiring type 2 Dx'ed: 2122 Complications: polyneuropathy Therapy: insulin since soon after dx DKA: never Severe hypoglycemia: never Pancreatitis: never Interval history: no cbg record, but states cbg's vary from 70-150.  It is in general higher as the day goes on.  pt states he feels well in general. Past Medical History  Diagnosis Date  . Diabetes mellitus without complication     No past surgical history on file.  History   Social History  . Marital Status: Single    Spouse Name: N/A  . Number of Children: N/A  . Years of Education: N/A   Occupational History  . Not on file.   Social History Main Topics  . Smoking status: Never Smoker   . Smokeless tobacco: Not on file  . Alcohol Use: No  . Drug Use: No  . Sexual Activity: Not on file   Other Topics Concern  . Not on file   Social History Narrative    Current Outpatient Prescriptions on File Prior to Visit  Medication Sig Dispense Refill  . BD PEN NEEDLE NANO U/F 32G X 4 MM MISC   3  . Blood Glucose Monitoring Suppl (ACCU-CHEK AVIVA PLUS) W/DEVICE KIT   12  . LANTUS SOLOSTAR 100 UNIT/ML Solostar Pen Inject 45 Units into the skin every morning.     Marland Kitchen lisinopril (PRINIVIL,ZESTRIL) 10 MG tablet      No current facility-administered medications on file prior to visit.    No Known Allergies  Family History  Problem Relation Age of Onset  . Diabetes Father     BP 120/84 mmHg  Pulse 82  Temp(Src) 98.6 F (37 C) (Oral)  Wt 302 lb 3.2 oz (137.077 kg)  SpO2 98%    Review of Systems He denies hypoglycemia    Objective:   Physical Exam VITAL SIGNS:  See vs page GENERAL: no distress SKIN:  Insulin injection sites at the anterior abdomen are normal       Assessment & Plan:  DM: The pattern of his cbg's indicates he needs a faster-acting  insulin.  i advised multiple daily injections, but he declines.  Patient is advised the following: Patient Instructions  check your blood sugar twice a day.  vary the time of day when you check, between before the 3 meals, and at bedtime.  also check if you have symptoms of your blood sugar being too high or too low.  please keep a record of the readings and bring it to your next appointment here.  You can write it on any piece of paper.  please call us sooner if your blood sugar goes below 70, or if you have a lot of readings over 200.  Please decrease the lantus to 45 units each morning. Please come back for a follow-up appointment in 2 months.

## 2014-08-30 NOTE — Patient Instructions (Addendum)
check your blood sugar twice a day.  vary the time of day when you check, between before the 3 meals, and at bedtime.  also check if you have symptoms of your blood sugar being too high or too low.  please keep a record of the readings and bring it to your next appointment here.  You can write it on any piece of paper.  please call us sooner if your blood sugar goes below 70, or if you have a lot of readings over 200.  Please decrease the lantus to 45 units each morning. Please come back for a follow-up appointment in 2 months.

## 2014-08-31 DIAGNOSIS — I10 Essential (primary) hypertension: Secondary | ICD-10-CM | POA: Insufficient documentation

## 2014-08-31 DIAGNOSIS — E785 Hyperlipidemia, unspecified: Secondary | ICD-10-CM | POA: Insufficient documentation

## 2014-10-02 ENCOUNTER — Encounter: Payer: Self-pay | Admitting: Endocrinology

## 2014-10-30 ENCOUNTER — Ambulatory Visit: Payer: 59 | Admitting: Endocrinology

## 2014-10-31 ENCOUNTER — Other Ambulatory Visit: Payer: Self-pay | Admitting: General Surgery

## 2014-12-18 ENCOUNTER — Ambulatory Visit: Payer: 59 | Admitting: Endocrinology

## 2015-01-14 DIAGNOSIS — E785 Hyperlipidemia, unspecified: Secondary | ICD-10-CM | POA: Insufficient documentation

## 2015-01-14 DIAGNOSIS — Z794 Long term (current) use of insulin: Secondary | ICD-10-CM

## 2015-01-14 DIAGNOSIS — E119 Type 2 diabetes mellitus without complications: Secondary | ICD-10-CM | POA: Insufficient documentation

## 2015-01-14 DIAGNOSIS — E669 Obesity, unspecified: Secondary | ICD-10-CM | POA: Insufficient documentation

## 2016-10-16 ENCOUNTER — Emergency Department (HOSPITAL_COMMUNITY)
Admission: EM | Admit: 2016-10-16 | Discharge: 2016-10-16 | Disposition: A | Discharge status: Home / Self Care | Payer: Self-pay | Attending: Emergency Medicine | Admitting: Emergency Medicine

## 2016-10-16 ENCOUNTER — Encounter (HOSPITAL_COMMUNITY): Payer: Self-pay

## 2016-10-16 ENCOUNTER — Emergency Department (HOSPITAL_COMMUNITY): Payer: Self-pay

## 2016-10-16 DIAGNOSIS — R079 Chest pain, unspecified: Secondary | ICD-10-CM

## 2016-10-16 DIAGNOSIS — E109 Type 1 diabetes mellitus without complications: Secondary | ICD-10-CM | POA: Insufficient documentation

## 2016-10-16 DIAGNOSIS — I10 Essential (primary) hypertension: Secondary | ICD-10-CM | POA: Insufficient documentation

## 2016-10-16 DIAGNOSIS — R0789 Other chest pain: Secondary | ICD-10-CM | POA: Insufficient documentation

## 2016-10-16 DIAGNOSIS — Z7982 Long term (current) use of aspirin: Secondary | ICD-10-CM | POA: Insufficient documentation

## 2016-10-16 HISTORY — DX: Pure hypercholesterolemia, unspecified: E78.00

## 2016-10-16 HISTORY — DX: Essential (primary) hypertension: I10

## 2016-10-16 LAB — BASIC METABOLIC PANEL
Anion gap: 11 (ref 5–15)
BUN: 23 mg/dL — ABNORMAL HIGH (ref 6–20)
CO2: 28 mmol/L (ref 22–32)
Calcium: 9.7 mg/dL (ref 8.9–10.3)
Chloride: 99 mmol/L — ABNORMAL LOW (ref 101–111)
Creatinine, Ser: 1.51 mg/dL — ABNORMAL HIGH (ref 0.61–1.24)
GFR calc Af Amer: >60 mL/min (ref >60)
GFR calc non Af Amer: 59 mL/min — ABNORMAL LOW (ref >60)
Glucose, Bld: 117 mg/dL — ABNORMAL HIGH (ref 65–99)
Potassium: 3.9 mmol/L (ref 3.5–5.1)
Sodium: 138 mmol/L (ref 135–145)

## 2016-10-16 LAB — I-STAT TROPONIN, ED: Troponin i, poc: 0 ng/mL (ref 0.00–0.08)

## 2016-10-16 LAB — CBC
HCT: 42.3 % (ref 39.0–52.0)
Hemoglobin: 14.5 g/dL (ref 13.0–17.0)
MCH: 28.4 pg (ref 26.0–34.0)
MCHC: 34.3 g/dL (ref 30.0–36.0)
MCV: 82.8 fL (ref 78.0–100.0)
Platelets: 237 10*3/uL (ref 150–400)
RBC: 5.11 MIL/uL (ref 4.22–5.81)
RDW: 12.4 % (ref 11.5–15.5)
WBC: 9.2 10*3/uL (ref 4.0–10.5)

## 2016-10-16 MED ORDER — AMLODIPINE BESYLATE 5 MG PO TABS: 5.0000 mg | ORAL_TABLET | Freq: Every day | ORAL | 2 refills | Status: DC

## 2016-10-16 NOTE — Discharge Instructions (Signed)
There were no problems found with your heart or lungs today.  For the painful discomfort, use Tylenol 3 or 4 times a day.  The cramping in your hands, is nonspecific.  It is possibly related to some mild dehydration.  Your kidney function is slightly abnormal with a creatinine of 1.5.  This could be related to a number of things including your blood pressure medicine, lisinopril, your diabetes, or your high blood pressure, itself. We are going to start you on a new blood pressure medicine called Norvasc.  Be careful with your diabetic diet.  Stay on low carbs as much as possible.  Try to drink an extra liter of water each day, and work on some gentle exercise.  Contact your insurance provider, to help you find a primary care doctor to see for ongoing care and management.

## 2016-10-16 NOTE — ED Notes (Signed)
Patient verbalized understanding of discharge instructions and denies any further needs or questions at this time. VS stable. Patient ambulatory with steady gait.  

## 2016-10-16 NOTE — ED Triage Notes (Signed)
Pt presents with 2 day h/o mid-sternal chest pain that is constant and does not radiate.  Pt reports numbness to L arm, reports shortness of breath with exertion.  Pt reports nausea after eating.

## 2016-10-16 NOTE — ED Provider Notes (Signed)
[facility] DEPT Provider Note   CSN: [id number] Arrival date & time: 10/16/16  1356     History   Chief Complaint Chief Complaint  Patient presents with  . Chest Pain    HPI [name] is a 31 y.o. male.  He presents for evaluation of chest pain described as a tightness, 2-3/10, radiating to left arm, constant for 3 days.  No known trauma.  He also has a occasional cramping sensation and feels like his fingers locked up in both hands, for several days.  He has been off his blood pressure medicine lisinopril for 2 weeks because he ran out.  His primary care doctor retired.  He denies nausea, vomiting, focal weakness or paresthesia.  He admits to being noncompliant with his low carbohydrate diet.  Is also occasionally noncompliant of his insulin treatment.  Family history positive for cardiac disease in his father, in his 73s.  There are no other no modifying factors.  HPI  Past Medical History:  Diagnosis Date  . Diabetes mellitus without complication ([place])   . Hypercholesteremia   . Hypertension     Patient Active Problem List   Diagnosis Date Noted  . Dyslipidemia 08/31/2014  . HTN (hypertension) 08/31/2014  . Diabetes ([place]) 08/15/2014    History reviewed. No pertinent surgical history.     Home Medications    Prior to Admission medications   Medication Sig Start Date End Date Taking? Authorizing Provider  aspirin 325 MG tablet Take 650 mg by mouth daily.   Yes [provider]  atorvastatin (LIPITOR) 10 MG tablet Take 10 mg by mouth daily at 6 PM.  08/22/14  Yes [provider]  LANTUS SOLOSTAR 100 UNIT/ML Solostar Pen Inject 45 Units into the skin every morning.  08/09/14  Yes [provider]  lisinopril (PRINIVIL,ZESTRIL) 10 MG tablet Take 10 mg by mouth daily.  08/13/14   [provider]    Family History Family History  Problem Relation Age of Onset  . Diabetes Father     Social History Social History  Substance Use  Topics  . Smoking status: Never Smoker  . Smokeless tobacco: Never Used  . Alcohol use No     Allergies   Patient has no known allergies.   Review of Systems Review of Systems  All other systems reviewed and are negative.    Physical Exam Updated Vital Signs BP 113/71 (BP Location: Left Arm)   Pulse 85   Temp 99 F (37.2 C) (Oral)   Resp 18   Ht 6\' 4"  (1.93 m)   Wt 290 lb (131.5 kg)   SpO2 99%   BMI 35.30 kg/m   Physical Exam  Constitutional: He is oriented to person, place, and time. He appears well-developed. No distress.  Overweight  HENT:  Head: Normocephalic and atraumatic.  Right Ear: External ear normal.  Left Ear: External ear normal.  Eyes: Conjunctivae and EOM are normal. Pupils are equal, round, and reactive to light.  Neck: Normal range of motion and phonation normal. Neck supple.  Cardiovascular: Normal rate, regular rhythm and normal heart sounds.   Pulmonary/Chest: Effort normal and breath sounds normal. He exhibits no tenderness and no bony tenderness.  Abdominal: Soft. There is no tenderness.  Musculoskeletal: Normal range of motion. He exhibits no edema.  Normal range of motion arms legs wrists fingers and hands bilaterally.  Neurological: He is alert and oriented to person, place, and time. No cranial nerve deficit or sensory deficit. He exhibits  normal muscle tone. Coordination normal.  No dysarthria, aphasia, or ataxia.  Skin: Skin is warm, dry and intact.  Psychiatric: He has a normal mood and affect. His behavior is normal. Judgment and thought content normal.  Nursing note and vitals reviewed.    ED Treatments / Results  Labs (all labs ordered are listed, but only abnormal results are displayed) Labs Reviewed  BASIC METABOLIC PANEL - Abnormal; Notable for the following:       Result Value   Chloride 99 (*)    Glucose, Bld 117 (*)    BUN 23 (*)    Creatinine, Ser 1.51 (*)    GFR calc non Af Amer 59 (*)    All other components  within normal limits  CBC  I-STAT TROPOININ, ED    EKG  EKG Interpretation  Date/Time:  Saturday Oct 16 2016 14:01:50 EDT Ventricular Rate:  88 PR Interval:  148 QRS Duration: 88 QT Interval:  366 QTC Calculation: 442 R Axis:   90 Text Interpretation:  Normal sinus rhythm Rightward axis Borderline ECG since last tracing no significant change Confirmed by Daleen Bo [phone]) on 10/16/2016 3:40:45 PM       Radiology Dg Chest 2 View  Result Date: 10/16/2016 CLINICAL DATA:  Midsternal chest pain.  Shortness of breath. EXAM: CHEST  2 VIEW COMPARISON:  August 10, 2014 FINDINGS: The heart size and mediastinal contours are within normal limits. Both lungs are clear. The visualized skeletal structures are unremarkable. IMPRESSION: No active cardiopulmonary disease. Electronically Signed   By: Dorise Bullion III M.D   On: 10/16/2016 14:59    Procedures Procedures (including critical care time)  Medications Ordered in ED Medications - No data to display   Initial Impression / Assessment and Plan / ED Course  I have reviewed the triage vital signs and the nursing notes.  Pertinent labs & imaging results that were available during my care of the patient were reviewed by me and considered in my medical decision making (see chart for details).     Medications - No data to display  Patient Vitals for the past 24 hrs:  BP Temp Temp src Pulse Resp SpO2 Height Weight  10/16/16 1403 - - - - - - 6\' 4"  (1.93 m) 290 lb (131.5 kg)  10/16/16 1359 113/71 99 F (37.2 C) Oral 85 18 99 % - -    4:17 PM Reevaluation with update and discussion. After initial assessment and treatment, an updated evaluation reveals no change in status, findings discussed with the patient and all questions were answered. Arturo Sofranko L    Final Clinical Impressions(s) / ED Diagnoses   Final diagnoses:  Nonspecific chest pain  Type 1 diabetes mellitus without complication (HCC)   Nonspecific constant chest  pain, with negative cardiac evaluation.  This appears to be noncardiac chest discomfort.  Incidental elevated creatinine, on blood testing.  Patient was taking lisinopril but has run out.  I suspect the creatinine elevation is multifactorial, but that he should stop taking the lisinopril.  Patient also is somewhat noncompliant with his medications and diet.  Doubt hypertensive urgency, ACS, metabolic instability or impending vascular collapse.  Nursing Notes Reviewed/ Care Coordinated Applicable Imaging Reviewed Interpretation of Laboratory Data incorporated into ED treatment  The patient appears reasonably screened and/or stabilized for discharge and I doubt any other medical condition or other [facility] requiring further screening, evaluation, or treatment in the ED at this time prior to discharge.  Plan: Home Medications- stop Lisinopril, continue remainder;  Home Treatments-increase oral fluids; return here if the recommended treatment, does not improve the symptoms; Recommended follow up-follow-up with new PCP, as soon as possible.    New Prescriptions New Prescriptions   No medications on file     Daleen Bo, MD 10/16/16 1622

## 2017-08-13 ENCOUNTER — Encounter (HOSPITAL_COMMUNITY): Payer: Self-pay | Admitting: *Deleted

## 2017-08-13 ENCOUNTER — Emergency Department (HOSPITAL_COMMUNITY): Payer: Self-pay

## 2017-08-13 ENCOUNTER — Other Ambulatory Visit: Payer: Self-pay

## 2017-08-13 ENCOUNTER — Emergency Department (HOSPITAL_COMMUNITY)
Admission: EM | Admit: 2017-08-13 | Discharge: 2017-08-13 | Disposition: A | Discharge status: Home / Self Care | Payer: Self-pay | Attending: Emergency Medicine | Admitting: Emergency Medicine

## 2017-08-13 DIAGNOSIS — E119 Type 2 diabetes mellitus without complications: Secondary | ICD-10-CM | POA: Insufficient documentation

## 2017-08-13 DIAGNOSIS — Z79899 Other long term (current) drug therapy: Secondary | ICD-10-CM | POA: Insufficient documentation

## 2017-08-13 DIAGNOSIS — Z7982 Long term (current) use of aspirin: Secondary | ICD-10-CM | POA: Insufficient documentation

## 2017-08-13 DIAGNOSIS — Z794 Long term (current) use of insulin: Secondary | ICD-10-CM | POA: Insufficient documentation

## 2017-08-13 DIAGNOSIS — R0789 Other chest pain: Secondary | ICD-10-CM | POA: Insufficient documentation

## 2017-08-13 DIAGNOSIS — I10 Essential (primary) hypertension: Secondary | ICD-10-CM | POA: Insufficient documentation

## 2017-08-13 LAB — BASIC METABOLIC PANEL
Anion gap: 12 (ref 5–15)
BUN: 33 mg/dL — ABNORMAL HIGH (ref 6–20)
CO2: 28 mmol/L (ref 22–32)
Calcium: 9.8 mg/dL (ref 8.9–10.3)
Chloride: 98 mmol/L — ABNORMAL LOW (ref 101–111)
Creatinine, Ser: 1.35 mg/dL — ABNORMAL HIGH (ref 0.61–1.24)
GFR calc Af Amer: >60 mL/min (ref >60)
GFR calc non Af Amer: >60 mL/min (ref >60)
Glucose, Bld: 134 mg/dL — ABNORMAL HIGH (ref 65–99)
Potassium: 3.7 mmol/L (ref 3.5–5.1)
Sodium: 138 mmol/L (ref 135–145)

## 2017-08-13 LAB — CBC
HCT: 40.9 % (ref 39.0–52.0)
Hemoglobin: 14 g/dL (ref 13.0–17.0)
MCH: 28.5 pg (ref 26.0–34.0)
MCHC: 34.2 g/dL (ref 30.0–36.0)
MCV: 83.3 fL (ref 78.0–100.0)
Platelets: 209 10*3/uL (ref 150–400)
RBC: 4.91 MIL/uL (ref 4.22–5.81)
RDW: 12.9 % (ref 11.5–15.5)
WBC: 9.3 10*3/uL (ref 4.0–10.5)

## 2017-08-13 LAB — I-STAT TROPONIN, ED
Troponin i, poc: 0 ng/mL (ref 0.00–0.08)
Troponin i, poc: 0 ng/mL (ref 0.00–0.08)

## 2017-08-13 LAB — CBG MONITORING, ED
Glucose-Capillary: 124 mg/dL — ABNORMAL HIGH (ref 65–99)
Glucose-Capillary: 89 mg/dL (ref 65–99)

## 2017-08-13 MED ORDER — ATORVASTATIN CALCIUM 10 MG PO TABS: 10.0000 mg | ORAL_TABLET | Freq: Every day | ORAL | 0 refills | Status: DC

## 2017-08-13 MED ORDER — AMLODIPINE BESYLATE 5 MG PO TABS: 5.0000 mg | ORAL_TABLET | Freq: Every day | ORAL | 0 refills | Status: DC

## 2017-08-13 MED ORDER — ASPIRIN 81 MG PO CHEW
324.0000 mg | CHEWABLE_TABLET | Freq: Once | ORAL | Status: AC
  Administered 2017-08-13: 324 mg via ORAL
  Filled 2017-08-13: qty 4

## 2017-08-13 MED ORDER — ONDANSETRON HCL 4 MG PO TABS
4.0000 mg | ORAL_TABLET | Freq: Once | ORAL | Status: AC
  Administered 2017-08-13: 4 mg via ORAL
  Filled 2017-08-13: qty 1

## 2017-08-13 NOTE — Discharge Instructions (Signed)
You were given a prescription for for amlodipine which you should take for your blood pressure daily.  You were also given a option for atorvastatin which you should take for your high cholesterol.  You should drink extra fluids. I put in a consult for social work, and they should reach out to you within the next few days to help evaluate a fever your medications.  I also gave information to follow-up with the [facility], you should call the office and make an appointment so that he can establish care with them.  You should follow-up in the emergency department if your symptoms do not resolve, or you have any new or worsening symptoms.

## 2017-08-13 NOTE — ED Notes (Signed)
Pt reports not taking insulin and BP meds & cholesterol meds for a year d/t no insurance

## 2017-08-13 NOTE — ED Triage Notes (Signed)
Pt c/o L CP that radiates to L arm onset x 2-3 days, pt c/o Nausea, pt denies v/d, pt family hx of MI in his 47s, pt A&O x4

## 2017-08-13 NOTE — ED Provider Notes (Signed)
[facility] EMERGENCY DEPARTMENT Provider Note   CSN: [id number] Arrival date & time: 08/13/17  1516     History   Chief Complaint Chief Complaint  Patient presents with  . Chest Pain    HPI [name] is a 32 y.o. male.  HPI   Pt is a  32 y/o male who presents to the ED c/o left sided chest pain that began intermittently a few days ago, but has now become constant. Pain is "more aggravating and nagging than anything". Cannot characterize pain, states that "I just notice it and its there". Initially episodes lasted minutes, and  Now pain is constant.  Rates it 5/10.  sxs were not provoked with exertion. States pain radiates to his left shoulder, but does not radiate into his jaw or into his back.  Declines any pain medication at this time. has Not tried taking anything prior to arrival.    Denies associated SOB, diaphoresis. Does report nausea today but no vomiting.  No fevers, wheezing, cough, congestion, rhinorrhea, abdominal pain, diarrhea, urinary symptoms.  No lightheadedness, or dizziness.  States he uses tobacco but denies any other drug use.  States he has not taken his medication for high blood pressure, high cholesterol, or diabetes for 1 year because he has not been able to afford it.  Reports family history of MI, father had MI in his 33s.  Denies leg pain/swelling, hemoptysis, recent surgery/trauma, recent long travel, hormone use, personal hx of cancer, or hx of DVT/PE.   Past Medical History:  Diagnosis Date  . Diabetes mellitus without complication ([place])   . Hypercholesteremia   . Hypertension     Patient Active Problem List   Diagnosis Date Noted  . Dyslipidemia 08/31/2014  . HTN (hypertension) 08/31/2014  . Diabetes ([place]) 08/15/2014    History reviewed. No pertinent surgical history.     Home Medications    Prior to Admission medications   Medication Sig Start Date End Date Taking? Authorizing Provider  amLODipine (NORVASC) 5 MG  tablet Take 1 tablet (5 mg total) by mouth daily. 08/13/17   Deejay Koppelman S, PA-C  aspirin 325 MG tablet Take 650 mg by mouth daily.    [provider]  atorvastatin (LIPITOR) 10 MG tablet Take 1 tablet (10 mg total) by mouth daily. 08/13/17   Shiloh Swopes S, PA-C  LANTUS SOLOSTAR 100 UNIT/ML Solostar Pen Inject 45 Units into the skin every morning.  08/09/14   [provider]    Family History Family History  Problem Relation Age of Onset  . Diabetes Father     Social History Social History   Tobacco Use  . Smoking status: Never Smoker  . Smokeless tobacco: Never Used  Substance Use Topics  . Alcohol use: No  . Drug use: No     Allergies   Patient has no known allergies.   Review of Systems Review of Systems  Constitutional: Negative for chills, diaphoresis and fever.  HENT: Negative for ear pain and sore throat.   Eyes: Negative for pain and visual disturbance.  Respiratory: Negative for cough and shortness of breath.   Cardiovascular: Positive for chest pain. Negative for palpitations and leg swelling.  Gastrointestinal: Positive for nausea. Negative for abdominal pain, constipation, diarrhea and vomiting.  Genitourinary: Negative for decreased urine volume, dysuria and hematuria.  Musculoskeletal: Negative for back pain.  Skin: Negative for color change and rash.  Neurological: Negative for dizziness, weakness, light-headedness and headaches.  All other systems  reviewed and are negative.    Physical Exam Updated Vital Signs BP (!) 132/92   Pulse 81   Temp 98.8 F (37.1 C) (Oral)   Resp 14   Ht 6\' 4"  (1.93 m)   Wt 131.5 kg (290 lb)   SpO2 97%   BMI 35.30 kg/m   Physical Exam  Constitutional: He appears well-developed and well-nourished.  Patient is in no acute distress and is nontoxic appearing.  He is texting on his phone when I enter the room and throughout the entire exam.  HENT:  Head: Normocephalic and atraumatic.  Eyes:  Conjunctivae and EOM are normal.  Neck: Normal range of motion. Neck supple.  Cardiovascular: Normal rate, regular rhythm and intact distal pulses.  No murmur heard. Pulmonary/Chest: Effort normal and breath sounds normal. No respiratory distress. He has no decreased breath sounds. He has no wheezes. He has no rhonchi. He has no rales.  Abdominal: Soft. Bowel sounds are normal. He exhibits no distension and no mass. There is no tenderness. There is no guarding.  Musculoskeletal: He exhibits no edema.       Right lower leg: Normal. He exhibits no tenderness and no edema.       Left lower leg: Normal. He exhibits no tenderness and no edema.  Neurological: He is alert.  Skin: Skin is warm and dry.  Psychiatric: He has a normal mood and affect.  Nursing note and vitals reviewed.    ED Treatments / Results  Labs (all labs ordered are listed, but only abnormal results are displayed) Labs Reviewed  BASIC METABOLIC PANEL - Abnormal; Notable for the following components:      Result Value   Chloride 98 (*)    Glucose, Bld 134 (*)    BUN 33 (*)    Creatinine, Ser 1.35 (*)    All other components within normal limits  CBG MONITORING, ED - Abnormal; Notable for the following components:   Glucose-Capillary 124 (*)    All other components within normal limits  CBC  I-STAT TROPONIN, ED  I-STAT TROPONIN, ED  CBG MONITORING, ED    EKG  EKG Interpretation  Date/Time:  Saturday August 13 2017 15:22:10 EST Ventricular Rate:  88 PR Interval:  158 QRS Duration: 88 QT Interval:  360 QTC Calculation: 435 R Axis:   83 Text Interpretation:  Normal sinus rhythm Normal ECG No significant change since last tracing Confirmed by Wandra Arthurs [phone]) on 08/13/2017 4:55:21 PM       Radiology Dg Chest 2 View  Result Date: 08/13/2017 CLINICAL DATA:  Left side chest pain, left upper arm pain and left hand tingling x3 days. Pt states pain has been constant since last night, but off and on over the past  3 days. Pt states he is supposed to be taking HTN meds but lost his health insurance and has not been able to take it for over a year.Hx of HTN, DM. Nonsmoker. EXAM: CHEST  2 VIEW COMPARISON:  10/16/2016 FINDINGS: The heart size and mediastinal contours are within normal limits. Both lungs are clear. No pleural effusion or pneumothorax. The visualized skeletal structures are unremarkable. IMPRESSION: Normal chest radiographs. Electronically Signed   By: Lajean Manes M.D.   On: 08/13/2017 16:06    Procedures Procedures (including critical care time)  Medications Ordered in ED Medications  ondansetron (ZOFRAN) tablet 4 mg (4 mg Oral Given 08/13/17 1855)  aspirin chewable tablet 324 mg (324 mg Oral Given 08/13/17 1855)  Initial Impression / Assessment and Plan / ED Course  I have reviewed the triage vital signs and the nursing notes.  Pertinent labs & imaging results that were available during my care of the patient were reviewed by me and considered in my medical decision making (see chart for details).       Patient had echo 06/20/2010 that was normal with the exception of mild LVH.  She states he has had a stress test in the past was negative.  He is not sure what year he had this done.  Final Clinical Impressions(s) / ED Diagnoses   Final diagnoses:  Hypertension, unspecified type  Atypical chest pain   Patient is to be discharged with recommendation to follow up with PCP in regards to today's hospital visit. Chest pain is not likely of cardiac or pulmonary etiology d/t presentation, PERC negative, VSS, no tracheal deviation, no JVD or new murmur, RRR, breath sounds equal bilaterally, EKG without acute abnormalities, negative delta troponin, and negative CXR. Heart score 2, low risk. Labs reassuring with mildly elevated bun and Cr consistent with baseline. Advised to increase fluid intake. Pt has been advised to return to the ED if CP becomes exertional, associated with diaphoresis or  nausea, radiates to left jaw/arm, worsens or becomes concerning in any way. Heart score 2. Consulted case management to help assist pt with med refills in future, and will have them contact pt in the next several days. Refilled HTN and HLD medication today. Did not refill insulin Rx as pt is unaware of dosing and his glucose has been normal in the ED.  Info given for f/u with primary care. Pt appears reliable for follow up and is agreeable to discharge.   Case has been discussed with Dr. Darl Householder who agrees with the above plan to discharge.   ED Discharge Orders        Ordered    amLODipine (NORVASC) 5 MG tablet  Daily     08/13/17 1947    atorvastatin (LIPITOR) 10 MG tablet  Daily     08/13/17 1947       Rodney Booze, PA-C 08/14/17 0025    Drenda Freeze, MD 08/14/17 1600

## 2017-08-13 NOTE — ED Notes (Signed)
Pt discharged from ED; instructions provided and scripts given; Pt encouraged to return to ED if symptoms worsen and to f/u with PCP; Pt verbalized understanding of all instructions 

## 2017-08-14 ENCOUNTER — Other Ambulatory Visit: Payer: Self-pay

## 2017-08-25 ENCOUNTER — Emergency Department (HOSPITAL_COMMUNITY): Payer: Self-pay

## 2017-08-25 ENCOUNTER — Encounter (HOSPITAL_COMMUNITY): Payer: Self-pay

## 2017-08-25 ENCOUNTER — Other Ambulatory Visit: Payer: Self-pay

## 2017-08-25 ENCOUNTER — Emergency Department (HOSPITAL_COMMUNITY)
Admission: EM | Admit: 2017-08-25 | Discharge: 2017-08-25 | Disposition: A | Discharge status: Home / Self Care | Payer: Self-pay | Attending: Emergency Medicine | Admitting: Emergency Medicine

## 2017-08-25 DIAGNOSIS — R0789 Other chest pain: Secondary | ICD-10-CM | POA: Insufficient documentation

## 2017-08-25 DIAGNOSIS — I10 Essential (primary) hypertension: Secondary | ICD-10-CM | POA: Insufficient documentation

## 2017-08-25 DIAGNOSIS — Z7982 Long term (current) use of aspirin: Secondary | ICD-10-CM | POA: Insufficient documentation

## 2017-08-25 DIAGNOSIS — E119 Type 2 diabetes mellitus without complications: Secondary | ICD-10-CM | POA: Insufficient documentation

## 2017-08-25 DIAGNOSIS — Z79899 Other long term (current) drug therapy: Secondary | ICD-10-CM | POA: Insufficient documentation

## 2017-08-25 LAB — CBC
HCT: 39.5 % (ref 39.0–52.0)
Hemoglobin: 13.4 g/dL (ref 13.0–17.0)
MCH: 28.2 pg (ref 26.0–34.0)
MCHC: 33.9 g/dL (ref 30.0–36.0)
MCV: 83.2 fL (ref 78.0–100.0)
Platelets: 212 10*3/uL (ref 150–400)
RBC: 4.75 MIL/uL (ref 4.22–5.81)
RDW: 12.7 % (ref 11.5–15.5)
WBC: 6.6 10*3/uL (ref 4.0–10.5)

## 2017-08-25 LAB — I-STAT TROPONIN, ED
Troponin i, poc: 0 ng/mL (ref 0.00–0.08)
Troponin i, poc: 0 ng/mL (ref 0.00–0.08)

## 2017-08-25 LAB — BASIC METABOLIC PANEL
Anion gap: 10 (ref 5–15)
BUN: 15 mg/dL (ref 6–20)
CO2: 25 mmol/L (ref 22–32)
Calcium: 9.3 mg/dL (ref 8.9–10.3)
Chloride: 103 mmol/L (ref 101–111)
Creatinine, Ser: 1.1 mg/dL (ref 0.61–1.24)
GFR calc Af Amer: >60 mL/min (ref >60)
GFR calc non Af Amer: >60 mL/min (ref >60)
Glucose, Bld: 350 mg/dL — ABNORMAL HIGH (ref 65–99)
Potassium: 4.1 mmol/L (ref 3.5–5.1)
Sodium: 138 mmol/L (ref 135–145)

## 2017-08-25 MED ORDER — OMEPRAZOLE 20 MG PO CPDR: 20.0000 mg | DELAYED_RELEASE_CAPSULE | Freq: Every day | ORAL | 0 refills | Status: DC

## 2017-08-25 MED ORDER — ASPIRIN 81 MG PO TABS: 81.0000 mg | ORAL_TABLET | Freq: Every day | ORAL | 0 refills | Status: DC

## 2017-08-25 NOTE — ED Provider Notes (Signed)
[facility] EMERGENCY DEPARTMENT Provider Note   CSN: [id number] Arrival date & time: 08/25/17  1304     History   Chief Complaint Chief Complaint  Patient presents with  . Chest Pain    HPI [name] is a 32 y.o. male.  The history is provided by the patient.  Chest Pain   This is a recurrent problem. The current episode started more than 1 week ago. The problem occurs daily. The problem has been gradually worsening. Associated with: nothing. Pain location: L chest. The patient is experiencing no pain (as high as 7/10). The quality of the pain is described as brief (tight). The pain radiates to the left neck and left arm. Exacerbated by: nothing. Associated symptoms include cough (after flu) and shortness of breath. Pertinent negatives include no abdominal pain, no back pain, no dizziness, no fever, no headaches, no nausea, no palpitations and no vomiting.    Past Medical History:  Diagnosis Date  . Diabetes mellitus without complication ([place])   . Hypercholesteremia   . Hypertension     Patient Active Problem List   Diagnosis Date Noted  . Dyslipidemia 08/31/2014  . HTN (hypertension) 08/31/2014  . Diabetes ([place]) 08/15/2014    History reviewed. No pertinent surgical history.     Home Medications    Prior to Admission medications   Medication Sig Start Date End Date Taking? Authorizing Provider  amLODipine (NORVASC) 5 MG tablet Take 1 tablet (5 mg total) by mouth daily. 08/13/17   Couture, Cortni S, PA-C  aspirin 81 MG tablet Take 1 tablet (81 mg total) by mouth daily. 08/25/17   Tobie Poet, DO  atorvastatin (LIPITOR) 10 MG tablet Take 1 tablet (10 mg total) by mouth daily. 08/13/17   Couture, Cortni S, PA-C  LANTUS SOLOSTAR 100 UNIT/ML Solostar Pen Inject 45 Units into the skin every morning.  08/09/14   [provider]  omeprazole (PRILOSEC) 20 MG capsule Take 1 capsule (20 mg total) by mouth daily. 08/25/17   Tobie Poet, DO    Family  History Family History  Problem Relation Age of Onset  . Diabetes Father     Social History Social History   Tobacco Use  . Smoking status: Never Smoker  . Smokeless tobacco: Never Used  Substance Use Topics  . Alcohol use: No  . Drug use: No     Allergies   Patient has no known allergies.   Review of Systems Review of Systems  Constitutional: Negative for chills and fever.  HENT: Negative for ear pain and sore throat.   Eyes: Negative for pain and visual disturbance.  Respiratory: Positive for cough (after flu) and shortness of breath.   Cardiovascular: Positive for chest pain. Negative for palpitations.  Gastrointestinal: Negative for abdominal pain, nausea and vomiting.  Genitourinary: Negative for dysuria and hematuria.  Musculoskeletal: Negative for back pain and neck pain.  Skin: Negative for color change and rash.  Neurological: Negative for dizziness, syncope, light-headedness and headaches.  All other systems reviewed and are negative.    Physical Exam Updated Vital Signs BP (!) 140/93   Pulse 76   Temp 98.8 F (37.1 C) (Oral)   Resp 18   SpO2 100%   Physical Exam  Constitutional: He is oriented to person, place, and time. He appears well-developed and well-nourished.  HENT:  Head: Normocephalic and atraumatic.  Eyes: Conjunctivae are normal.  Neck: Neck supple.  Cardiovascular: Normal rate and regular rhythm.  No murmur heard.  No systolic murmur is present.  No diastolic murmur is present. Pulmonary/Chest: Effort normal and breath sounds normal. No respiratory distress. He has no decreased breath sounds. He has no wheezes. He has no rales. He exhibits no tenderness.  Abdominal: Soft. There is no tenderness.  Musculoskeletal: He exhibits no edema.       Right lower leg: Normal. He exhibits no edema.       Left lower leg: Normal. He exhibits no edema.  Neurological: He is alert and oriented to person, place, and time.  Skin: Skin is warm and  dry.  Psychiatric: He has a normal mood and affect.  Nursing note and vitals reviewed.    ED Treatments / Results  Labs (all labs ordered are listed, but only abnormal results are displayed) Labs Reviewed  BASIC METABOLIC PANEL - Abnormal; Notable for the following components:      Result Value   Glucose, Bld 350 (*)    All other components within normal limits  CBC  I-STAT TROPONIN, ED  I-STAT TROPONIN, ED    EKG  EKG Interpretation  Date/Time:  Thursday August 25 2017 13:07:34 EDT Ventricular Rate:  89 PR Interval:  162 QRS Duration: 102 QT Interval:  372 QTC Calculation: 452 R Axis:   114 Text Interpretation:  Normal sinus rhythm Right axis deviation Incomplete right bundle branch block Abnormal ECG No significant change since last tracing Confirmed by Orlie Dakin [phone]) on 08/25/2017 8:06:42 PM       Radiology Dg Chest 2 View  Result Date: 08/25/2017 CLINICAL DATA:  Chest pain for 3 weeks. Worsening over the past few days. EXAM: CHEST - 2 VIEW COMPARISON:  08/13/2017. FINDINGS: The heart size and mediastinal contours are within normal limits. Both lungs are clear. The visualized skeletal structures are unremarkable. IMPRESSION: No active cardiopulmonary disease.  No change from priors. Electronically Signed   By: Staci Righter M.D.   On: 08/25/2017 13:57    Procedures Procedures (including critical care time)  Medications Ordered in ED Medications - No data to display   Initial Impression / Assessment and Plan / ED Course  I have reviewed the triage vital signs and the nursing notes.  Pertinent labs & imaging results that were available during my care of the patient were reviewed by me and considered in my medical decision making (see chart for details).     Patient is a 32 year old male presenting with chest pain.  The chest pain is atypical in nature.  He does not have any exertional chest pain.  He does state sometimes it radiates to his left arm  however.  He was evaluated for this recently and had a negative cardiac workup.  Patient does have multiple risk factors, however is a heart score of 3.  Patient evaluated per heart pathway.  His EKG is nondiagnostic and has incomplete right bundle branch block with no ischemia.  Grossly unchanged from prior.  He has a negative delta troponin and is safe for discharge home.  Patient encouraged to follow-up with PCP closely and may need outpatient nonemergent stress test.  Patient given strict return precautions.  Doubt PE, patient is PERC negative.  Chest x-ray negative.  Doubt pneumothorax, pneumonia, aortic dissection.  Patient currently taking full dose aspirin and he has symptoms of GERD.  Patient will be started on a PPI.  Patient also instructed to take a baby aspirin daily.  Patient counseled on risk factor modification including diet and exercise and keeping a tight control over  his blood pressure and diabetes.  Patient understands and agrees to plan.  Patient discharged in good condition.  Final Clinical Impressions(s) / ED Diagnoses   Final diagnoses:  Atypical chest pain    ED Discharge Orders        Ordered    aspirin 81 MG tablet  Daily     08/25/17 2107    omeprazole (PRILOSEC) 20 MG capsule  Daily     08/25/17 2107       Tobie Poet, DO 08/26/17 4469    Orlie Dakin, MD 08/26/17 [phone]

## 2017-08-25 NOTE — ED Notes (Signed)
PT states understanding of care given, follow up care, and medication prescribed. PT ambulated from ED to car with a steady gait. 

## 2017-08-25 NOTE — ED Provider Notes (Addendum)
Patient placed in Quick Look pathway, seen and evaluated   Chief Complaint: chest pain  HPI:   Pt complains of chest pain  ROS: no fever, no chills, positive short of breath, no abdominal pain  Physical Exam:   Gen: No distress  Neuro: Awake and Alert  Skin: Warm    Focused Exam: resp normal Heart rate normal    Initiation of care has begun. The patient has been counseled on the process, plan, and necessity for staying for the completion/evaluation, and the remainder of the medical screening examination   Sidney Ace 08/25/17 1324    [address] PA-C 08/25/17 Lee, MD 09/01/17 [phone]

## 2017-08-25 NOTE — ED Provider Notes (Signed)
Complains of left anterior chest pain for the past 2 weeks.  Pain is nonexertional nothing makes symptoms better or worse last anywhere from 10 minutes to 2 hours at a time.  On exam he is alert no distress lungs clear to auscultation heart regular rate and rhythm no murmurs rubs abdomen obese, nontender all 4 extremities without redness or tenderness neurovascular intact   Orlie Dakin, MD 08/25/17 2048

## 2017-08-25 NOTE — ED Notes (Signed)
Delay in lab draw,  edp at bedside. 

## 2017-08-25 NOTE — ED Triage Notes (Signed)
Pt presents for evaluation of ongoing chest pain x 3 weeks. Reports worsening over past few days with radiation to L arm. Pt reports ongoing nausea.

## 2017-09-11 ENCOUNTER — Ambulatory Visit (HOSPITAL_COMMUNITY)
Admission: EM | Admit: 2017-09-11 | Discharge: 2017-09-11 | Disposition: A | Discharge status: Home / Self Care | Payer: Self-pay | Attending: Physician Assistant | Admitting: Physician Assistant

## 2017-09-11 ENCOUNTER — Encounter (HOSPITAL_COMMUNITY): Payer: Self-pay | Admitting: Family Medicine

## 2017-09-11 DIAGNOSIS — L0291 Cutaneous abscess, unspecified: Secondary | ICD-10-CM

## 2017-09-11 MED ORDER — NAPROXEN 500 MG PO TABS: 500.0000 mg | ORAL_TABLET | Freq: Two times a day (BID) | ORAL | 0 refills | Status: AC

## 2017-09-11 MED ORDER — DOXYCYCLINE HYCLATE 100 MG PO CAPS: 100.0000 mg | ORAL_CAPSULE | Freq: Two times a day (BID) | ORAL | 0 refills | Status: AC

## 2017-09-11 NOTE — ED Provider Notes (Signed)
09/11/2017 6:46 PM   DOB: January 03, 1986 / MRN: [id number]  SUBJECTIVE:  [name] is a 32 y.o. male presenting for upper back pain that started 4 days ago and is worsening.  Patient tells me "there is a huge bump back there and you cannot miss it."  Complains of tenderness around the area.  He has been taking Tylenol with good relief of pain and fever.  He has No Known Allergies.   He  has a past medical history of Diabetes mellitus without complication ([place]), Hypercholesteremia, and Hypertension.    He  reports that he has never smoked. He has never used smokeless tobacco. He reports that he does not drink alcohol or use drugs. He  has no sexual activity history on file. The patient  has no past surgical history on file.  His family history includes Diabetes in his father.  Review of Systems  Constitutional: Positive for fever and malaise/fatigue.  Respiratory: Negative for shortness of breath.   Skin: Positive for rash. Negative for itching.  Neurological: Negative for dizziness.    OBJECTIVE:  BP (!) 143/91   Pulse (!) 108   Temp 98.7 F (37.1 C)   Resp 18   SpO2 100%   Physical Exam  Constitutional: He appears well-developed. He is active and cooperative.  Non-toxic appearance.  Cardiovascular: Normal rate.  Pulmonary/Chest: Effort normal. No stridor. No tachypnea. No respiratory distress. He has no wheezes. He has no rales.  Neurological: He is alert.  Skin: Skin is warm and dry. He is not diaphoretic. No pallor.     Vitals reviewed.   No results found for this or any previous visit (from the past 72 hour(s)).  No results found.  ASSESSMENT AND PLAN:  No orders of the defined types were placed in this encounter.    Abscess: Nonfluctuant at this time.  I am starting him on doxycycline and prescribing a prescription strength NSAID.  Advised to come back in about 3 days for recheck of the abscess will likely be ready for drainage at that time.  Advised if symptoms  are worsening despite taking medication to present to the emergency department.      The patient is advised to call or return to clinic if he does not see an improvement in symptoms, or to seek the care of the closest emergency department if he worsens with the above plan.   Philis Fendt, MHS, PA-C 09/11/2017 6:46 PM   Tereasa Coop, PA-C 09/11/17 1847

## 2017-09-11 NOTE — Discharge Instructions (Signed)
Please apply warm compress to affected area often.  Take the doxycycline and please do not miss doses.  Have given you a prescription strength NSAID that should be helpful for pain and fever.  I like to see you back here in about 3 days as the abscess will likely be ready for drainage at that time.

## 2017-09-11 NOTE — ED Triage Notes (Signed)
Pt here for abscess to back x a few days with fever and nausea. Denies drainage.

## 2017-09-13 ENCOUNTER — Encounter (HOSPITAL_COMMUNITY): Payer: Self-pay | Admitting: Emergency Medicine

## 2017-09-13 ENCOUNTER — Emergency Department (HOSPITAL_COMMUNITY): Payer: Self-pay

## 2017-09-13 DIAGNOSIS — R509 Fever, unspecified: Secondary | ICD-10-CM | POA: Insufficient documentation

## 2017-09-13 DIAGNOSIS — R42 Dizziness and giddiness: Secondary | ICD-10-CM | POA: Insufficient documentation

## 2017-09-13 DIAGNOSIS — R079 Chest pain, unspecified: Secondary | ICD-10-CM | POA: Insufficient documentation

## 2017-09-13 DIAGNOSIS — Z5321 Procedure and treatment not carried out due to patient leaving prior to being seen by health care provider: Secondary | ICD-10-CM | POA: Insufficient documentation

## 2017-09-13 DIAGNOSIS — M79602 Pain in left arm: Secondary | ICD-10-CM | POA: Insufficient documentation

## 2017-09-13 LAB — BASIC METABOLIC PANEL
Anion gap: 14 (ref 5–15)
BUN: 25 mg/dL — ABNORMAL HIGH (ref 6–20)
CO2: 25 mmol/L (ref 22–32)
Calcium: 9.3 mg/dL (ref 8.9–10.3)
Chloride: 103 mmol/L (ref 101–111)
Creatinine, Ser: 1.17 mg/dL (ref 0.61–1.24)
GFR calc Af Amer: >60 mL/min (ref >60)
GFR calc non Af Amer: >60 mL/min (ref >60)
Glucose, Bld: 80 mg/dL (ref 65–99)
Potassium: 3.2 mmol/L — ABNORMAL LOW (ref 3.5–5.1)
Sodium: 142 mmol/L (ref 135–145)

## 2017-09-13 LAB — CBC
HCT: 36.5 % — ABNORMAL LOW (ref 39.0–52.0)
Hemoglobin: 12.3 g/dL — ABNORMAL LOW (ref 13.0–17.0)
MCH: 27.9 pg (ref 26.0–34.0)
MCHC: 33.7 g/dL (ref 30.0–36.0)
MCV: 82.8 fL (ref 78.0–100.0)
Platelets: 253 10*3/uL (ref 150–400)
RBC: 4.41 MIL/uL (ref 4.22–5.81)
RDW: 12.6 % (ref 11.5–15.5)
WBC: 13.9 10*3/uL — ABNORMAL HIGH (ref 4.0–10.5)

## 2017-09-13 LAB — I-STAT TROPONIN, ED: Troponin i, poc: 0 ng/mL (ref 0.00–0.08)

## 2017-09-13 NOTE — ED Triage Notes (Signed)
Pt co left side chest pain that is a lot of pressure and different then chest pains he had couple weeks ago when he was seen at [facility] reprots that was seen at [facility] recently for abscess on back and on antibiotics. Patient reports still has a lot of pain from abscess and unsure if pain is radiating from back to chest. No drainage from abscess.

## 2017-09-14 ENCOUNTER — Emergency Department (HOSPITAL_COMMUNITY)
Admission: EM | Admit: 2017-09-14 | Discharge: 2017-09-14 | Disposition: A | Discharge status: Home / Self Care | Payer: Self-pay | Attending: Emergency Medicine | Admitting: Emergency Medicine

## 2017-09-14 NOTE — ED Notes (Signed)
Pt. Called to room no anwer x 2.

## 2017-09-14 NOTE — ED Notes (Signed)
09/14/2017,  Attempted follow-up call, no answer.

## 2017-11-02 DIAGNOSIS — Z794 Long term (current) use of insulin: Secondary | ICD-10-CM | POA: Diagnosis not present

## 2017-11-02 DIAGNOSIS — R42 Dizziness and giddiness: Secondary | ICD-10-CM | POA: Diagnosis not present

## 2017-11-02 DIAGNOSIS — I10 Essential (primary) hypertension: Secondary | ICD-10-CM | POA: Diagnosis not present

## 2017-11-02 DIAGNOSIS — E782 Mixed hyperlipidemia: Secondary | ICD-10-CM | POA: Diagnosis not present

## 2017-11-02 DIAGNOSIS — E118 Type 2 diabetes mellitus with unspecified complications: Secondary | ICD-10-CM | POA: Diagnosis not present

## 2018-01-07 DIAGNOSIS — L0390 Cellulitis, unspecified: Secondary | ICD-10-CM | POA: Diagnosis not present

## 2018-01-09 ENCOUNTER — Encounter: Payer: Self-pay | Admitting: Podiatry

## 2018-01-09 ENCOUNTER — Ambulatory Visit (INDEPENDENT_AMBULATORY_CARE_PROVIDER_SITE_OTHER): Payer: BLUE CROSS/BLUE SHIELD

## 2018-01-09 ENCOUNTER — Ambulatory Visit: Payer: BLUE CROSS/BLUE SHIELD | Admitting: Podiatry

## 2018-01-09 VITALS — Temp 99.4°F

## 2018-01-09 DIAGNOSIS — M869 Osteomyelitis, unspecified: Secondary | ICD-10-CM

## 2018-01-09 DIAGNOSIS — I739 Peripheral vascular disease, unspecified: Secondary | ICD-10-CM

## 2018-01-09 DIAGNOSIS — L97529 Non-pressure chronic ulcer of other part of left foot with unspecified severity: Secondary | ICD-10-CM

## 2018-01-09 DIAGNOSIS — R609 Edema, unspecified: Secondary | ICD-10-CM

## 2018-01-09 DIAGNOSIS — R0989 Other specified symptoms and signs involving the circulatory and respiratory systems: Secondary | ICD-10-CM

## 2018-01-09 MED ORDER — CIPROFLOXACIN HCL 500 MG PO TABS: 500.0000 mg | ORAL_TABLET | Freq: Two times a day (BID) | ORAL | 0 refills | Status: DC

## 2018-01-09 MED ORDER — CLINDAMYCIN HCL 300 MG PO CAPS: 300.0000 mg | ORAL_CAPSULE | Freq: Four times a day (QID) | ORAL | 0 refills | Status: DC

## 2018-01-10 ENCOUNTER — Inpatient Hospital Stay (HOSPITAL_COMMUNITY): Admission: RE | Admit: 2018-01-10 | Payer: Self-pay | Source: Ambulatory Visit

## 2018-01-10 LAB — COMPLETE METABOLIC PANEL WITH GFR
AG Ratio: 1.3 (calc) (ref 1.0–2.5)
ALT: 13 U/L (ref 9–46)
AST: 13 U/L (ref 10–40)
Albumin: 3.9 g/dL (ref 3.6–5.1)
Alkaline phosphatase (APISO): 93 U/L (ref 40–115)
BUN/Creatinine Ratio: 26 (calc) — ABNORMAL HIGH (ref 6–22)
BUN: 32 mg/dL — ABNORMAL HIGH (ref 7–25)
CO2: 27 mmol/L (ref 20–32)
Calcium: 9.4 mg/dL (ref 8.6–10.3)
Chloride: 104 mmol/L (ref 98–110)
Creat: 1.24 mg/dL (ref 0.60–1.35)
GFR, Est African American: 87 mL/min/{1.73_m2} (ref >=60)
GFR, Est Non African American: 75 mL/min/{1.73_m2} (ref >=60)
Globulin: 3.1 g/dL (calc) (ref 1.9–3.7)
Glucose, Bld: 103 mg/dL (ref 65–139)
Potassium: 4.2 mmol/L (ref 3.5–5.3)
Sodium: 142 mmol/L (ref 135–146)
Total Bilirubin: 0.7 mg/dL (ref 0.2–1.2)
Total Protein: 7 g/dL (ref 6.1–8.1)

## 2018-01-10 LAB — CBC WITH DIFFERENTIAL/PLATELET
Basophils Absolute: 27 cells/uL (ref 0–200)
Basophils Relative: 0.3 %
Eosinophils Absolute: 209 cells/uL (ref 15–500)
Eosinophils Relative: 2.3 %
HCT: 33.9 % — ABNORMAL LOW (ref 38.5–50.0)
Hemoglobin: 11.2 g/dL — ABNORMAL LOW (ref 13.2–17.1)
Lymphs Abs: 1957 cells/uL (ref 850–3900)
MCH: 27.9 pg (ref 27.0–33.0)
MCHC: 33 g/dL (ref 32.0–36.0)
MCV: 84.3 fL (ref 80.0–100.0)
MPV: 10 fL (ref 7.5–12.5)
Monocytes Relative: 7.4 %
Neutro Abs: 6234 cells/uL (ref 1500–7800)
Neutrophils Relative %: 68.5 %
Platelets: 263 10*3/uL (ref 140–400)
RBC: 4.02 10*6/uL — ABNORMAL LOW (ref 4.20–5.80)
RDW: 13.6 % (ref 11.0–15.0)
Total Lymphocyte: 21.5 %
WBC mixed population: 673 cells/uL (ref 200–950)
WBC: 9.1 10*3/uL (ref 3.8–10.8)

## 2018-01-10 LAB — HEMOGLOBIN A1C
Hgb A1c MFr Bld: 5.8 % of total Hgb — ABNORMAL HIGH (ref <5.7)
Mean Plasma Glucose: 120 (calc)
eAG (mmol/L): 6.6 (calc)

## 2018-01-10 LAB — C-REACTIVE PROTEIN: CRP: 94.8 mg/L — ABNORMAL HIGH (ref <8.0)

## 2018-01-10 LAB — SEDIMENTATION RATE: Sed Rate: 68 mm/h — ABNORMAL HIGH (ref 0–15)

## 2018-01-11 ENCOUNTER — Other Ambulatory Visit: Payer: Self-pay | Admitting: Podiatry

## 2018-01-11 ENCOUNTER — Ambulatory Visit (HOSPITAL_COMMUNITY)
Admission: RE | Admit: 2018-01-11 | Discharge: 2018-01-11 | Disposition: A | Discharge status: Home / Self Care | Payer: BLUE CROSS/BLUE SHIELD | Source: Ambulatory Visit | Attending: Cardiovascular Disease | Admitting: Cardiovascular Disease

## 2018-01-11 DIAGNOSIS — L97521 Non-pressure chronic ulcer of other part of left foot limited to breakdown of skin: Secondary | ICD-10-CM

## 2018-01-11 DIAGNOSIS — R0989 Other specified symptoms and signs involving the circulatory and respiratory systems: Secondary | ICD-10-CM

## 2018-01-11 DIAGNOSIS — R609 Edema, unspecified: Secondary | ICD-10-CM | POA: Diagnosis not present

## 2018-01-11 NOTE — Progress Notes (Signed)
Subjective:   Patient ID: [name], male   DOB: 32 y.o.   MRN: [id number]   HPI 32 year old male presents the office with concerns of a wound to the left second toe which is been ongoing for about a week.  He states that he did go to urgent care and was given cephalexin to take 4 times a day which is only taking the last 2 days.  He has noticed increasing swelling and pain to his leg.  He states that a piece of nail that he did cut off himself which led to the wound.  Overall he feels well he denies any systemic complaints including fevers, chills, nausea, vomiting.  Denies any chest pain, shortness of breath.   Review of Systems  All other systems reviewed and are negative.  Past Medical History:  Diagnosis Date  . Diabetes mellitus without complication ([place])   . Hypercholesteremia   . Hypertension     History reviewed. No pertinent surgical history.   Current Outpatient Medications:  .  amLODipine (NORVASC) 5 MG tablet, Take 1 tablet (5 mg total) by mouth daily., Disp: 30 tablet, Rfl: 0 .  aspirin 81 MG tablet, Take 1 tablet (81 mg total) by mouth daily., Disp: 30 tablet, Rfl: 0 .  atorvastatin (LIPITOR) 10 MG tablet, Take 1 tablet (10 mg total) by mouth daily., Disp: 30 tablet, Rfl: 0 .  ciprofloxacin (CIPRO) 500 MG tablet, Take 1 tablet (500 mg total) by mouth 2 (two) times daily., Disp: 20 tablet, Rfl: 0 .  clindamycin (CLEOCIN) 300 MG capsule, Take 1 capsule (300 mg total) by mouth 4 (four) times daily., Disp: 40 capsule, Rfl: 0 .  LANTUS SOLOSTAR 100 UNIT/ML Solostar Pen, Inject 45 Units into the skin every morning. , Disp: , Rfl:  .  omeprazole (PRILOSEC) 20 MG capsule, Take 1 capsule (20 mg total) by mouth daily., Disp: 30 capsule, Rfl: 0  No Known Allergies        Objective:  Physical Exam  General: AAO x3, NAD  Dermatological: Ulceration present to the distal aspect left second toe which is progressing down to the bone.  There is no frank purulence coming from  the area and only some serosanguineous drainage.  There is edema to the left leg and foot and going to the toe.  The increase in warmth of the foot.  There is no area of fluctuation or crepitation or any malodor.  No other open lesions identified.  Vascular: Dorsalis Pedis artery and Posterior Tibial artery pedal pulses are decreased.there is swelling to the leg and there is mild pain with calf compression.   Neruologic: Sensation decreased.  Musculoskeletal: No significant deformities present.  Gait: Unassisted, Nonantalgic.             Assessment:   Left second toe osteomyelitis, cellulitis     Plan:  -Treatment options discussed including all alternatives, risks, and complications -Etiology of symptoms were discussed -X-rays were obtained and reviewed with the patient.  Lysis is present in the distal joints of the left second toe and proximally into the middle phalanx.  This is consistent with osteomyelitis.  No soft tissue emphysema is present. -An ABI was done in the office which is normal however want to get an ABI with a TBI to ensure adequate circulation. -Discussed with him likely amputation of the toe.  Also other options we discussed long-term antibiotics but think the best to do amputation make sure there is adequate circulation.  We will change  him from Keflex and prescribed clindamycin and ciprofloxacin for him today as well.  Wound culture was also obtained.  Surgical shoe was dispensed.  Discussed with him there is any signs or symptoms of worsening infection so medially to the emergency room he will need IV antibiotics.  Trula Slade DPM

## 2018-01-12 ENCOUNTER — Ambulatory Visit (HOSPITAL_COMMUNITY)
Admission: RE | Admit: 2018-01-12 | Payer: BLUE CROSS/BLUE SHIELD | Source: Ambulatory Visit | Attending: Podiatry | Admitting: Podiatry

## 2018-01-12 LAB — WOUND CULTURE
MICRO NUMBER:: 90893399
SPECIMEN QUALITY:: ADEQUATE

## 2018-01-16 ENCOUNTER — Encounter: Payer: Self-pay | Admitting: Podiatry

## 2018-01-16 ENCOUNTER — Ambulatory Visit (INDEPENDENT_AMBULATORY_CARE_PROVIDER_SITE_OTHER): Payer: BLUE CROSS/BLUE SHIELD | Admitting: Podiatry

## 2018-01-16 DIAGNOSIS — L97529 Non-pressure chronic ulcer of other part of left foot with unspecified severity: Secondary | ICD-10-CM

## 2018-01-16 DIAGNOSIS — R42 Dizziness and giddiness: Secondary | ICD-10-CM | POA: Diagnosis not present

## 2018-01-16 DIAGNOSIS — M869 Osteomyelitis, unspecified: Secondary | ICD-10-CM | POA: Diagnosis not present

## 2018-01-16 NOTE — Patient Instructions (Signed)

## 2018-01-17 ENCOUNTER — Encounter: Payer: Self-pay | Admitting: Podiatry

## 2018-01-17 MED ORDER — AMOXICILLIN-POT CLAVULANATE 875-125 MG PO TABS: 1.0000 | ORAL_TABLET | Freq: Two times a day (BID) | ORAL | 0 refills | Status: DC

## 2018-01-18 NOTE — Progress Notes (Signed)
Subjective: 32 year old male presents the office today for a wound to the left second toe.  Since the last time he still the clindamycin and ciprofloxacin.  He has noticed that the swelling to his leg and foot has improved however she still has swelling to the toe itself.  He denies any pus.  He has no pain to the toe.  He is remained in the surgical shoe.  Overall he feels well he has no systemic complaints including fevers, chills, nausea, vomiting.  He denies any calf pain, chest pain, shortness of breath.  He has no other concerns today. Denies any systemic complaints such as fevers, chills, nausea, vomiting. No acute changes since last appointment, and no other complaints at this time.   Objective: AAO x3, NAD DP/PT pulses palpable bilaterally, CRT less than 3 seconds Ulceration continues to the distal aspect the left second toe which probes gently down the bone and there is edema and erythema to the toe.  Small amount of purulence was expressed in the distal portion of the toe.  There is still swelling to the left foot and leg however this is improved compared to what it was last appointment.  There is no areas of fluctuation, crepitation there is no malodor. No open lesions or pre-ulcerative lesions.  No pain with calf compression, swelling, warmth, erythema  Assessment: Decreased swelling with osteomyelitis of the left second toe  Plan: -All treatment options discussed with the patient including all alternatives, risks, complications.  -I reviewed the cultures as well as blood work with him.  This point I do recommended amputation of the toe.  He he did not get the arterial studies however given the infection I do think the toe needs to be amputated.  Encouraged him to reschedule the arterial studies.  I would plan on doing this this week however he did not want to do this week.  Discussed next week when he is going on vacation he wants to go on the vacation.  I will plan on doing this in 2  weeks however he needs to watch this closely if there is any signs or symptoms of worsening infection he has to go to the emergency room.  I prefer not to wait to do the surgery for fear of spreading the infection but he wants to hold off.  Discussed that infection can spread quickly. -Discussed left second toe amputation possible second metatarsal head resection.  Discussed with surgery as well as postoperative course. -The incision placement as well as the postoperative course was discussed with the patient. I discussed risks of the surgery which include, but not limited to, infection, bleeding, pain, swelling, need for further surgery, delayed or nonhealing, painful or ugly scar, numbness or sensation changes, over/under correction, recurrence, transfer lesions, further deformity, hardware failure, DVT/PE, loss of toe/foot. Patient understands these risks and wishes to proceed with surgery. The surgical consent was reviewed with the patient all 3 pages were signed. No promises or guarantees were given to the outcome of the procedure. All questions were answered to the best of my ability. Before the surgery the patient was encouraged to call the office if there is any further questions. The surgery will be performed at [facility] an outpatient basis. -For now continue with antibiotics, surgical shoe and elevation. -Patient encouraged to call the office with any questions, concerns, change in symptoms.   Trula Slade DPM

## 2018-01-23 DIAGNOSIS — R42 Dizziness and giddiness: Secondary | ICD-10-CM | POA: Diagnosis not present

## 2018-01-23 DIAGNOSIS — E118 Type 2 diabetes mellitus with unspecified complications: Secondary | ICD-10-CM | POA: Diagnosis not present

## 2018-02-01 ENCOUNTER — Other Ambulatory Visit: Payer: Self-pay | Admitting: Podiatry

## 2018-02-01 ENCOUNTER — Encounter: Payer: Self-pay | Admitting: Podiatry

## 2018-02-01 DIAGNOSIS — M869 Osteomyelitis, unspecified: Secondary | ICD-10-CM | POA: Diagnosis not present

## 2018-02-01 DIAGNOSIS — E7800 Pure hypercholesterolemia, unspecified: Secondary | ICD-10-CM | POA: Diagnosis not present

## 2018-02-01 DIAGNOSIS — M86172 Other acute osteomyelitis, left ankle and foot: Secondary | ICD-10-CM | POA: Diagnosis not present

## 2018-02-01 MED ORDER — CLINDAMYCIN HCL 300 MG PO CAPS: 300.0000 mg | ORAL_CAPSULE | Freq: Four times a day (QID) | ORAL | 0 refills | Status: DC

## 2018-02-01 MED ORDER — PROMETHAZINE HCL 25 MG PO TABS: 25.0000 mg | ORAL_TABLET | Freq: Three times a day (TID) | ORAL | 0 refills | Status: DC | PRN

## 2018-02-01 MED ORDER — OXYCODONE-ACETAMINOPHEN 5-325 MG PO TABS: 1.0000 | ORAL_TABLET | Freq: Four times a day (QID) | ORAL | 0 refills | Status: DC | PRN

## 2018-02-01 NOTE — Progress Notes (Signed)
Postop medications sent to the pharmacy ([facility] on [address] at their request)

## 2018-02-02 ENCOUNTER — Telehealth: Payer: Self-pay | Admitting: *Deleted

## 2018-02-02 NOTE — Telephone Encounter (Signed)
Called and spoke with the patient and the patient is doing good and is elevating and there is not a lot of pain and there has been no fever, chills, nausea and I stated if there was any concerns or questions to call the office and I also stated that we would see patient tomorrow morning at 10:30 am. [name]

## 2018-02-03 ENCOUNTER — Ambulatory Visit (INDEPENDENT_AMBULATORY_CARE_PROVIDER_SITE_OTHER): Payer: BLUE CROSS/BLUE SHIELD | Admitting: Podiatry

## 2018-02-03 ENCOUNTER — Ambulatory Visit (INDEPENDENT_AMBULATORY_CARE_PROVIDER_SITE_OTHER): Payer: BLUE CROSS/BLUE SHIELD

## 2018-02-03 VITALS — BP 158/98 | HR 84 | Temp 98.2°F

## 2018-02-03 DIAGNOSIS — L97529 Non-pressure chronic ulcer of other part of left foot with unspecified severity: Secondary | ICD-10-CM

## 2018-02-03 DIAGNOSIS — M869 Osteomyelitis, unspecified: Secondary | ICD-10-CM

## 2018-02-06 ENCOUNTER — Encounter: Payer: BLUE CROSS/BLUE SHIELD | Admitting: Podiatry

## 2018-02-06 NOTE — Progress Notes (Signed)
Subjective: [name] is a 32 y.o. is seen today in office s/p left second toe amputation preformed on 02/01/2018. They state their pain is controlled and he has been taking pain medication. He has been wearing his cam boot entrance office but is much as possible and elevating.  Has been continue with antibiotics.  Denies any systemic complaints such as fevers, chills, nausea, vomiting. No calf pain, chest pain, shortness of breath.   Objective: General: No acute distress, AAOx3  DP/PT pulses palpable 2/4, CRT < 3 sec to all digits.  Protective sensation intact. Motor function intact.  Left foot: Incision is well coapted without any evidence of dehiscence. There is no surrounding erythema, ascending cellulitis, fluctuance, crepitus, malodor, drainage/purulence. There is minimal edema around the surgical site. There is minimal pain along the surgical site.  No other areas of tenderness to bilateral lower extremities.  No other open lesions or pre-ulcerative lesions.  No pain with calf compression, swelling, warmth, erythema.   Assessment and Plan:  Status post left second toe amputation, doing well with no complications   -Treatment options discussed including all alternatives, risks, and complications -X-rays were obtained reviewed.  Status post amputation second toe. Metatarsal head appears to be viable without any evidence of osteomyelitis. -Small amount of Betadine was applied to the incision followed by dry sterile dressing. -Ice/elevation -Pain medication as needed. -Monitor for any clinical signs or symptoms of infection and DVT/PE and directed to call the office immediately should any occur or go to the ER. -Follow-up in 1 week or sooner if any problems arise. In the meantime, encouraged to call the office with any questions, concerns, change in symptoms.   Celesta Gentile, DPM

## 2018-02-10 ENCOUNTER — Ambulatory Visit (INDEPENDENT_AMBULATORY_CARE_PROVIDER_SITE_OTHER): Payer: Self-pay | Admitting: Podiatry

## 2018-02-10 ENCOUNTER — Encounter: Payer: Self-pay | Admitting: Podiatry

## 2018-02-10 VITALS — BP 133/88 | Temp 98.3°F

## 2018-02-10 DIAGNOSIS — L97529 Non-pressure chronic ulcer of other part of left foot with unspecified severity: Secondary | ICD-10-CM

## 2018-02-10 DIAGNOSIS — M869 Osteomyelitis, unspecified: Secondary | ICD-10-CM

## 2018-02-15 NOTE — Progress Notes (Signed)
Subjective: [name] is a 32 y.o. is seen today in office s/p left second toe amputation preformed on 02/01/2018.  He states he is doing well his pain is controlled.  He presents today for dressing change.  He has been walking the cam boot.  And try to elevate as much as possible.  Still on antibiotics.  Denies any pain.  He has no other concerns today. Denies any systemic complaints such as fevers, chills, nausea, vomiting. No calf pain, chest pain, shortness of breath.   Objective: General: No acute distress, AAOx3  DP/PT pulses palpable 2/4, CRT < 3 sec to all digits.  Protective sensation intact. Motor function intact.  Left foot: Incision is well coapted without any evidence of dehiscence.  Sutures are intact.  There is some old dried blood present but there is no fresh blood.  There is no purulence.  No surrounding erythema, ascending cellulitis.  No fluctuation or crepitation or any signs of infection identified today. No other open lesions or pre-ulcerative lesions.  No pain with calf compression, swelling, warmth, erythema.   Assessment and Plan:  Status post left second toe amputation, doing well with no complications   -Treatment options discussed including all alternatives, risks, and complications -Incision appears to be healing well.  All the sutures intact.  The area was cleaned.  Betadine was painted over the incision followed by dry sterile dressing.  He can change the bandage every couple days as needed with a similar dressing. -Continue cam boot at all times. -Elevation. -Finish the course of antibiotics.  Wound culture was negative so we will hold any further antibiotics for monitoring signs or symptoms of infection. -Pain medication as needed. -Follow-up in 1 week for possible suture removal or sooner if any issues are to arise.  Trula Slade DPM

## 2018-02-16 ENCOUNTER — Encounter: Payer: BLUE CROSS/BLUE SHIELD | Admitting: Podiatry

## 2018-02-17 ENCOUNTER — Ambulatory Visit (INDEPENDENT_AMBULATORY_CARE_PROVIDER_SITE_OTHER): Payer: BLUE CROSS/BLUE SHIELD | Admitting: Podiatry

## 2018-02-17 ENCOUNTER — Encounter: Payer: Self-pay | Admitting: Podiatry

## 2018-02-17 DIAGNOSIS — M869 Osteomyelitis, unspecified: Secondary | ICD-10-CM

## 2018-02-17 DIAGNOSIS — L97529 Non-pressure chronic ulcer of other part of left foot with unspecified severity: Secondary | ICD-10-CM

## 2018-02-18 NOTE — Progress Notes (Signed)
Subjective: [name] is a 32 y.o. is seen today in office s/p left second toe amputation preformed on 02/01/2018.  He states he is doing well.  Continue with surgical shoe.  He is finished his course of antibiotics last week and is not on antibiotics currently.  Overall he feels well he denies any systemic complaints including fevers, chills, nausea, vomiting. No calf pain, chest pain, shortness of breath.   Objective: General: No acute distress, AAOx3  DP/PT pulses palpable 2/4, CRT < 3 sec to all digits.  Protective sensation intact. Motor function intact.  Left foot: Incision is well coapted without any evidence of dehiscence.  Spermatic relation tissue present within the central aspect.  The sutures to try to get buried within the wound.  Minimal swelling to the area but overall the swelling is much improved compared to what it was prior to surgery to his foot and leg.  There is no surrounding erythema, ascending cellulitis.  No fluctuation or crepitation or any malodor.  No other open lesions or pre-ulcerative lesions.  No pain with calf compression, swelling, warmth, erythema.   Assessment and Plan:  Status post left second toe amputation, doing well with no complications   -Treatment options discussed including all alternatives, risks, and complications -Today I did remove the sutures without any complications.,  Granulation tissue present within the central aspect there is no probing there is no drainage or pus or any signs of infection.  Continue with Betadine dressing changes daily.  Continue with surgical shoe and encouraged elevation.  Pain medication as needed but he has not been needing this. Monitor for any clinical signs or symptoms of infection and directed to call the office immediately should any occur or go to the ER.  Trula Slade DPM

## 2018-02-24 ENCOUNTER — Encounter: Payer: Self-pay | Admitting: Podiatry

## 2018-02-24 ENCOUNTER — Ambulatory Visit (INDEPENDENT_AMBULATORY_CARE_PROVIDER_SITE_OTHER): Payer: BLUE CROSS/BLUE SHIELD

## 2018-02-24 ENCOUNTER — Ambulatory Visit (INDEPENDENT_AMBULATORY_CARE_PROVIDER_SITE_OTHER): Payer: BLUE CROSS/BLUE SHIELD | Admitting: Podiatry

## 2018-02-24 DIAGNOSIS — L97529 Non-pressure chronic ulcer of other part of left foot with unspecified severity: Secondary | ICD-10-CM

## 2018-02-28 NOTE — Progress Notes (Signed)
Subjective: [name] is a 32 y.o. is seen today in office s/p left second toe amputation preformed on 02/01/2018.  Overall he states he is doing well is having no pain.  Is been putting Betadine on the wound daily.  Denies any drainage or pus or any increase in redness or swelling to his feet.  He has no other concerns today.  He denies any fevers, chills, nausea, vomiting. No calf pain, chest pain, shortness of breath.   Objective: General: No acute distress, AAOx3  DP/PT pulses palpable 2/4, CRT < 3 sec to all digits.  Protective sensation intact. Motor function intact.  Left foot: Superficial granular wound appearing status post left second toe rotation.  The central aspect does probe approximately 1 cm but there is no probing to bone.  There is no drainage or pus.  There is no surrounding erythema, ascending cellulitis.  Small amount of fibrotic tissue present within the wound.  There is no fluctuation or crepitation or any malodor. No other open lesions or pre-ulcerative lesions.  No pain with calf compression, swelling, warmth, erythema.   Assessment and Plan:  Status post left second toe amputation, doing well with no complications   -Treatment options discussed including all alternatives, risks, and complications -Today I debrided the wound utilizing tissue nipper and 312 blade scalpel without any complications to healthy, bleeding, granular tissue.  On the pack the wound open with a corner of a 4 x 4 moistened dressing which I showed him not to do today.  Hopefully this will allow the facilitate healing from the inside out.  Continue with surgical shoe and elevation.  Continue compression.  I will hold any further oral antibiotics as there is no clinical signs of infection noted today to monitor closely.  Return in about 1 week (around 03/03/2018).  Trula Slade DPM

## 2018-03-03 ENCOUNTER — Encounter: Payer: BLUE CROSS/BLUE SHIELD | Admitting: Podiatry

## 2018-03-06 ENCOUNTER — Ambulatory Visit (INDEPENDENT_AMBULATORY_CARE_PROVIDER_SITE_OTHER): Payer: BLUE CROSS/BLUE SHIELD | Admitting: Podiatry

## 2018-03-06 ENCOUNTER — Encounter: Payer: Self-pay | Admitting: Podiatry

## 2018-03-06 ENCOUNTER — Ambulatory Visit (INDEPENDENT_AMBULATORY_CARE_PROVIDER_SITE_OTHER): Payer: BLUE CROSS/BLUE SHIELD

## 2018-03-06 VITALS — Temp 98.1°F

## 2018-03-06 DIAGNOSIS — L97529 Non-pressure chronic ulcer of other part of left foot with unspecified severity: Secondary | ICD-10-CM

## 2018-03-06 DIAGNOSIS — M869 Osteomyelitis, unspecified: Secondary | ICD-10-CM

## 2018-03-07 MED ORDER — AMOXICILLIN-POT CLAVULANATE 875-125 MG PO TABS: 1.0000 | ORAL_TABLET | Freq: Two times a day (BID) | ORAL | 0 refills | Status: DC

## 2018-03-07 NOTE — Progress Notes (Signed)
Subjective: [name] is a 32 y.o. is seen today in office s/p left second toe amputation preformed on 02/01/2018.  He states he is doing well he has not had any drainage or pus coming from the area but he states his wife did notice a small amount of odor coming from the wound.  He has been doing daily dressing changes been wearing the surgical shoe.  Denies any increase in swelling to his feet he has no other concerns today.  He is currently not taking any antibiotics.  He denies any fevers, chills, nausea, vomiting. No calf pain, chest pain, shortness of breath.   Objective: General: No acute distress, AAOx3  DP/PT pulses palpable 2/4, CRT < 3 sec to all digits.  Protective sensation intact. Motor function intact.  Left foot: Superficial granular wound appearing status post left second toe amputation.  The central aspect does probe approximately 1 cm but there is no probing to bone.  Appears to be probing about the same.  There is some fibroglandular tissue at the periphery of the wound prior to debridement.  There is no surrounding erythema there is no ascending cellulitis.  There is no fluctuation or crepitation.  I am unable to appreciate any malodor from the wound today.  No other open lesions or pre-ulcerative lesions.  No pain with calf compression, swelling, warmth, erythema.   Assessment and Plan:  Status post left second toe amputation, doing well with no complications   -Treatment options discussed including all alternatives, risks, and complications -Today I debrided the wound utilizing tissue nipper and 312 blade scalpel without any complications to healthy, bleeding, granular tissue.  There does not appear to be any signs of an abscess deep.  I irrigated the wound and packed the area followed by Betadine and dry dressing.  Continue with daily dressing changes at home.  Remain in surgical shoe.  We are going to start Augmentin in case of infection although does not clinically appear to  be infected.  Return in about 1 week (around 03/13/2018).  Trula Slade DPM

## 2018-03-13 ENCOUNTER — Ambulatory Visit (INDEPENDENT_AMBULATORY_CARE_PROVIDER_SITE_OTHER): Payer: BLUE CROSS/BLUE SHIELD | Admitting: Podiatry

## 2018-03-13 DIAGNOSIS — L97522 Non-pressure chronic ulcer of other part of left foot with fat layer exposed: Secondary | ICD-10-CM

## 2018-03-13 NOTE — Progress Notes (Signed)
Subjective: [name] is a 32 y.o. is seen today in office s/p left second toe amputation preformed on 02/01/2018.  He states his wife has been continue to pack per the saline 4 x 4 gauze and pain in the Betadine over the area.  Denies any drainage or pus coming to the area that he can recall.  Denies any swelling or redness or any red streaks.  He has no pain.  He is wearing the surgical shoe.  He has no other concerns today. He denies any fevers, chills, nausea, vomiting. No calf pain, chest pain, shortness of breath.   Objective: General: No acute distress, AAOx3  DP/PT pulses palpable 2/4, CRT < 3 sec to all digits.  Protective sensation intact. Motor function intact.  Left foot: Superficial granular wound appearing status post left second toe amputation.  Overall the wound appears to be healing much better.  There is some granulation tissue present within the wound.  There is no drainage or pus there is no malodor.  There is no surrounding erythema, ascending cellulitis.  There is no fluctuation or crepitation or any malodor.  The wound still probes approximately half a centimeter.  All the time of the wound is smaller. No other open lesions or pre-ulcerative lesions.  No pain with calf compression, swelling, warmth, erythema.   Assessment and Plan:  Status post left second toe amputation, doing well with no complications   -Treatment options discussed including all alternatives, risks, and complications -Today I debrided the wound utilizing tissue nipper and 312 blade scalpel without any complications to healthy, bleeding, granular tissue.  There does not appear to be any signs of an abscess deep.  I irrigated the wound today.  We will switch to meta honey dressing changes daily which was applied today and also dispensed some for him.  We discussed the course of antibiotics.  Return in about 1 week (around 03/20/2018).  Trula Slade DPM

## 2018-03-21 ENCOUNTER — Other Ambulatory Visit: Payer: Self-pay

## 2018-03-21 ENCOUNTER — Other Ambulatory Visit: Payer: BLUE CROSS/BLUE SHIELD

## 2018-03-23 ENCOUNTER — Ambulatory Visit: Payer: BLUE CROSS/BLUE SHIELD | Admitting: Neurology

## 2018-03-31 ENCOUNTER — Ambulatory Visit (INDEPENDENT_AMBULATORY_CARE_PROVIDER_SITE_OTHER): Payer: BLUE CROSS/BLUE SHIELD | Admitting: Podiatry

## 2018-03-31 DIAGNOSIS — L97521 Non-pressure chronic ulcer of other part of left foot limited to breakdown of skin: Secondary | ICD-10-CM

## 2018-04-01 NOTE — Progress Notes (Signed)
Subjective: [name] is a 32 y.o. is seen today in office s/p left second toe amputation preformed on 02/01/2018.  He presents today for follow-up evaluation of a wound.  He states he is doing well and is having no pain today.  He denies any drainage or pus.  He has been keeping a bandage on it daily, Medihoney. Denies any swelling or redness or any red streaks.  He has no pain.  He is wearing the surgical shoe.  He has no other concerns today. He denies any fevers, chills, nausea, vomiting. No calf pain, chest pain, shortness of breath.   Objective: General: No acute distress, AAOx3  DP/PT pulses palpable 2/4, CRT < 3 sec to all digits.  Left foot: Status post left second toe amputation.  Overall incision appears to be almost completely healed.  On the dorsal aspect there is a 1 small pinpoint area that still remains open approximately 0.2 cm.  Overall is much improved however.  There is no drainage or pus.  There is no fluctuation or crepitation or any malodor.  There is no surrounding edema, erythema, increase in warmth. No other open lesions or pre-ulcerative lesions.  No pain with calf compression, swelling, warmth, erythema.   Assessment and Plan:  Status post left second toe amputation, doing well with no complications   -Treatment options discussed including all alternatives, risks, and complications -Today I debrided the wound utilizing tissue nipper and 312 blade scalpel without any complications to healthy, bleeding, granular tissue.  Overall incision appears to be almost healed I debrided the more dorsal area pinpoint opening.  There is no signs of an abscess infection.  Continue daily dressing changes. -He completed a course of antibiotics but any issues.  Trula Slade DPM

## 2018-04-07 ENCOUNTER — Other Ambulatory Visit: Payer: BLUE CROSS/BLUE SHIELD

## 2018-04-18 ENCOUNTER — Ambulatory Visit: Payer: BLUE CROSS/BLUE SHIELD | Admitting: Podiatry

## 2018-05-15 ENCOUNTER — Ambulatory Visit: Payer: BLUE CROSS/BLUE SHIELD | Admitting: Neurology

## 2018-05-15 ENCOUNTER — Telehealth: Payer: Self-pay

## 2018-05-15 NOTE — Telephone Encounter (Signed)
Pt did not show for their appt with Dr. Athar today.  

## 2018-05-16 ENCOUNTER — Encounter: Payer: Self-pay | Admitting: Neurology

## 2018-06-25 ENCOUNTER — Emergency Department (HOSPITAL_COMMUNITY)
Admission: EM | Admit: 2018-06-25 | Discharge: 2018-06-25 | Disposition: A | Discharge status: Home / Self Care | Payer: BLUE CROSS/BLUE SHIELD | Attending: Emergency Medicine | Admitting: Emergency Medicine

## 2018-06-25 ENCOUNTER — Encounter (HOSPITAL_COMMUNITY): Payer: Self-pay

## 2018-06-25 ENCOUNTER — Emergency Department (HOSPITAL_COMMUNITY): Payer: BLUE CROSS/BLUE SHIELD

## 2018-06-25 DIAGNOSIS — E119 Type 2 diabetes mellitus without complications: Secondary | ICD-10-CM | POA: Diagnosis not present

## 2018-06-25 DIAGNOSIS — R079 Chest pain, unspecified: Secondary | ICD-10-CM

## 2018-06-25 DIAGNOSIS — I10 Essential (primary) hypertension: Secondary | ICD-10-CM | POA: Insufficient documentation

## 2018-06-25 DIAGNOSIS — Z79899 Other long term (current) drug therapy: Secondary | ICD-10-CM | POA: Diagnosis not present

## 2018-06-25 DIAGNOSIS — Z794 Long term (current) use of insulin: Secondary | ICD-10-CM | POA: Insufficient documentation

## 2018-06-25 DIAGNOSIS — I160 Hypertensive urgency: Secondary | ICD-10-CM

## 2018-06-25 LAB — BASIC METABOLIC PANEL
Anion gap: 9 (ref 5–15)
BUN: 35 mg/dL — ABNORMAL HIGH (ref 6–20)
CO2: 23 mmol/L (ref 22–32)
Calcium: 9.1 mg/dL (ref 8.9–10.3)
Chloride: 107 mmol/L (ref 98–111)
Creatinine, Ser: 1.24 mg/dL (ref 0.61–1.24)
GFR calc Af Amer: >60 mL/min (ref >60)
GFR calc non Af Amer: >60 mL/min (ref >60)
Glucose, Bld: 169 mg/dL — ABNORMAL HIGH (ref 70–99)
Potassium: 3.5 mmol/L (ref 3.5–5.1)
Sodium: 139 mmol/L (ref 135–145)

## 2018-06-25 LAB — I-STAT TROPONIN, ED
Troponin i, poc: 0 ng/mL (ref 0.00–0.08)
Troponin i, poc: 0 ng/mL (ref 0.00–0.08)

## 2018-06-25 LAB — CBC
HCT: 37 % — ABNORMAL LOW (ref 39.0–52.0)
Hemoglobin: 12.2 g/dL — ABNORMAL LOW (ref 13.0–17.0)
MCH: 27.7 pg (ref 26.0–34.0)
MCHC: 33 g/dL (ref 30.0–36.0)
MCV: 84.1 fL (ref 80.0–100.0)
Platelets: 188 10*3/uL (ref 150–400)
RBC: 4.4 MIL/uL (ref 4.22–5.81)
RDW: 13 % (ref 11.5–15.5)
WBC: 6.3 10*3/uL (ref 4.0–10.5)
nRBC: 0 % (ref 0.0–0.2)

## 2018-06-25 LAB — D-DIMER, QUANTITATIVE: D-Dimer, Quant: <0.27 ug/mL-FEU (ref 0.00–0.50)

## 2018-06-25 MED ORDER — LABETALOL HCL 5 MG/ML IV SOLN
10.0000 mg | Freq: Once | INTRAVENOUS | Status: AC
  Administered 2018-06-25: 10 mg via INTRAVENOUS
  Filled 2018-06-25: qty 4

## 2018-06-25 MED ORDER — LISINOPRIL 10 MG PO TABS: 10.0000 mg | ORAL_TABLET | Freq: Every day | ORAL | 0 refills | Status: DC

## 2018-06-25 MED ORDER — ASPIRIN 325 MG PO TABS
325.0000 mg | ORAL_TABLET | ORAL | Status: AC
  Administered 2018-06-25: 325 mg via ORAL
  Filled 2018-06-25: qty 1

## 2018-06-25 MED ORDER — NITROGLYCERIN 0.4 MG SL SUBL
0.4000 mg | SUBLINGUAL_TABLET | SUBLINGUAL | Status: DC | PRN
  Filled 2018-06-25: qty 1

## 2018-06-25 MED ORDER — LISINOPRIL 10 MG PO TABS
10.0000 mg | ORAL_TABLET | Freq: Once | ORAL | Status: AC
  Administered 2018-06-25: 10 mg via ORAL
  Filled 2018-06-25: qty 1

## 2018-06-25 NOTE — ED Provider Notes (Signed)
[facility] Provider Note   CSN: [id number] Arrival date & time: 06/25/18  1853   History   Chief Complaint Chief Complaint  Patient presents with  . Chest Pain    HPI [name] is a 33 y.o. male.  The history is provided by the patient and medical records. No language interpreter was used.  Chest Pain  Pain location:  L chest and substernal area Pain radiates to:  L arm Pain severity:  Mild Onset quality:  Gradual Duration:  2 days Timing:  Constant Progression:  Worsening Chronicity:  New Context: not breathing, not drug use, not eating, not intercourse, not lifting, not movement, not raising an arm, not at rest, not stress and not trauma   Relieved by:  Nothing Worsened by:  Nothing Ineffective treatments:  None tried Associated symptoms: shortness of breath   Associated symptoms: no abdominal pain, no altered mental status, no anorexia, no anxiety, no back pain, no claudication, no cough, no diaphoresis, no dizziness, no dysphagia, no fatigue, no fever, no headache, no heartburn, no lower extremity edema, no nausea, no near-syncope, no numbness, no orthopnea, no palpitations, no syncope, no vomiting and no weakness   Risk factors: diabetes mellitus, high cholesterol, hypertension and male sex   Risk factors: no immobilization, no prior DVT/PE and no surgery     Past Medical History:  Diagnosis Date  . Diabetes mellitus without complication ([place])   . Hypercholesteremia   . Hypertension     Patient Active Problem List   Diagnosis Date Noted  . Hyperlipidemia 01/14/2015  . Obesity (BMI 30-39.9) 01/14/2015  . Insulin-requiring or dependent type II diabetes mellitus ([place]) 01/14/2015  . Dyslipidemia 08/31/2014  . HTN (hypertension) 08/31/2014  . Diabetes ([place]) 08/15/2014    History reviewed. No pertinent surgical history.    Home Medications    Prior to Admission medications   Medication Sig Start Date End Date  Taking? Authorizing Provider  ACCU-CHEK GUIDE test strip FOR USE WHEN CHECKING BLOOD SUGARS TID 01/03/18   [provider]  amLODipine (NORVASC) 5 MG tablet Take 1 tablet (5 mg total) by mouth daily. 08/13/17   Couture, Cortni S, PA-C  amoxicillin-clavulanate (AUGMENTIN) 875-125 MG tablet Take 1 tablet by mouth 2 (two) times daily. 03/07/18   Trula Slade, DPM  aspirin 81 MG tablet Take 1 tablet (81 mg total) by mouth daily. 08/25/17   Tobie Poet, DO  atorvastatin (LIPITOR) 10 MG tablet Take 1 tablet (10 mg total) by mouth daily. 08/13/17   Couture, Cortni S, PA-C  ciprofloxacin (CIPRO) 500 MG tablet Take 1 tablet (500 mg total) by mouth 2 (two) times daily. 01/09/18   Trula Slade, DPM  clindamycin (CLEOCIN) 300 MG capsule Take 1 capsule (300 mg total) by mouth 4 (four) times daily. 02/01/18   Trula Slade, DPM  LANTUS SOLOSTAR 100 UNIT/ML Solostar Pen Inject 45 Units into the skin every morning.  08/09/14   [provider]  lisinopril (PRINIVIL,ZESTRIL) 10 MG tablet TAKE 1 TABLET BY MOUTH ONCE DAILY. 11/08/14   [provider]  lisinopril (PRINIVIL,ZESTRIL) 10 MG tablet TK 1 T PO QD 02/03/18   [provider]  lisinopril (PRINIVIL,ZESTRIL) 10 MG tablet Take 1 tablet (10 mg total) by mouth daily. 06/25/18   Hulan Saas, MD  metFORMIN (GLUCOPHAGE-XR) 500 MG 24 hr tablet Take 500 mg by mouth 2 (two) times daily.    [provider]  NOVOLOG FLEXPEN 100 UNIT/ML FlexPen INJ 10  UNI [place] UP TO TID OR B MEALS 02/24/18   [provider]  omeprazole (PRILOSEC) 20 MG capsule Take 1 capsule (20 mg total) by mouth daily. 08/25/17   Tobie Poet, DO  oxyCODONE-acetaminophen (PERCOCET/ROXICET) 5-325 MG tablet Take 1-2 tablets by mouth every 6 (six) hours as needed for severe pain. 02/01/18   Trula Slade, DPM  PREVIDENT 5000 PLUS 1.1 % CREA dental cream U UTD BID 03/08/18   [provider]  promethazine (PHENERGAN) 25 MG tablet Take 1 tablet  (25 mg total) by mouth every 8 (eight) hours as needed for nausea or vomiting. 02/01/18   Trula Slade, DPM    Family History Family History  Problem Relation Age of Onset  . Diabetes Father     Social History Social History   Tobacco Use  . Smoking status: Never Smoker  . Smokeless tobacco: Never Used  Substance Use Topics  . Alcohol use: No  . Drug use: No     Allergies   Patient has no known allergies.   Review of Systems Review of Systems  Constitutional: Negative for chills, diaphoresis, fatigue and fever.  HENT: Negative for ear pain, sore throat and trouble swallowing.   Eyes: Negative for pain and visual disturbance.  Respiratory: Positive for shortness of breath. Negative for cough.   Cardiovascular: Positive for chest pain. Negative for palpitations, orthopnea, claudication, syncope and near-syncope.  Gastrointestinal: Negative for abdominal pain, anorexia, heartburn, nausea and vomiting.  Genitourinary: Negative for dysuria and hematuria.  Musculoskeletal: Negative for arthralgias and back pain.  Skin: Negative for color change and rash.  Neurological: Negative for dizziness, seizures, syncope, weakness, numbness and headaches.  All other systems reviewed and are negative.    Physical Exam Updated Vital Signs BP 126/75   Pulse 88   Temp 98.3 F (36.8 C) (Oral)   Resp 14   SpO2 99%   Physical Exam Vitals signs and nursing note reviewed.  Constitutional:      Appearance: He is well-developed.  HENT:     Head: Normocephalic and atraumatic.  Eyes:     Conjunctiva/sclera: Conjunctivae normal.  Neck:     Musculoskeletal: Neck supple.  Cardiovascular:     Rate and Rhythm: Regular rhythm. Tachycardia present.     Heart sounds: No murmur.  Pulmonary:     Effort: Pulmonary effort is normal. No respiratory distress.     Breath sounds: Normal breath sounds.  Abdominal:     Palpations: Abdomen is soft.     Tenderness: There is no abdominal  tenderness.  Skin:    General: Skin is warm and dry.     Capillary Refill: Capillary refill takes less than 2 seconds.  Neurological:     General: No focal deficit present.     Mental Status: He is alert.      ED Treatments / Results  Labs (all labs ordered are listed, but only abnormal results are displayed) Labs Reviewed  BASIC METABOLIC PANEL - Abnormal; Notable for the following components:      Result Value   Glucose, Bld 169 (*)    BUN 35 (*)    All other components within normal limits  CBC - Abnormal; Notable for the following components:   Hemoglobin 12.2 (*)    HCT 37.0 (*)    All other components within normal limits  D-DIMER, QUANTITATIVE (NOT AT [facility])  I-STAT TROPONIN, ED  I-STAT TROPONIN, ED    EKG EKG Interpretation  Date/Time:  Sunday June 25 2018 19:03:25 EST Ventricular Rate:  97 PR Interval:    QRS Duration: 105 QT Interval:  353 QTC Calculation: 449 R Axis:   72 Text Interpretation:  Sinus rhythm Low voltage, precordial leads No significant change since last tracing Confirmed by Isla Pence [phone]) on 06/25/2018 7:16:28 PM   Radiology Dg Chest 2 View  Result Date: 06/25/2018 CLINICAL DATA:  Chest pain EXAM: CHEST - 2 VIEW COMPARISON:  09/13/2017 FINDINGS: The lungs are clear without focal pneumonia, edema, pneumothorax or pleural effusion. The cardiopericardial silhouette is within normal limits for size. The visualized bony structures of the thorax are intact. Telemetry leads overlie the chest. IMPRESSION: No active cardiopulmonary disease. Electronically Signed   By: Misty Stanley M.D.   On: 06/25/2018 20:14    Procedures Procedures (including critical care time)  Medications Ordered in ED Medications  nitroGLYCERIN (NITROSTAT) SL tablet 0.4 mg (has no administration in time range)  aspirin tablet 325 mg (325 mg Oral Given 06/25/18 1953)  lisinopril (PRINIVIL,ZESTRIL) tablet 10 mg (10 mg Oral Given 06/25/18 1953)  labetalol  (NORMODYNE,TRANDATE) injection 10 mg (10 mg Intravenous Given 06/25/18 1954)     Initial Impression / Assessment and Plan / ED Course  I have reviewed the triage vital signs and the nursing notes.  Pertinent labs & imaging results that were available during my care of the patient were reviewed by me and considered in my medical decision making (see chart for details).     Patient is a 33 year old male who presents with above-stated history exam for evaluation of chest pain.  On presentation patient's blood pressure was noted to be 189/123 with a heart rate of 105 and otherwise stable vital signs and room air.  Exam as above.  ECG shows sinus rhythm with a rate of 97, normal axis, normal intervals, no significant changes compared to prior, no signs of acute ischemic change.  Initial troponin is undetectable.  Given age and risk factors as well as here score of 3 will obtain 3-hour delta troponin.  BMP shows no significant electrolyte or metabolic abnormalities.  CBC shows a WBC count of 6.3, hemoglobin 12.2, platelets 188.  I will suspicion for PE given d-dimer is undetectable.  3-hour delta troponin is undetectable.  Impression is hypertensive urgency.  Patient was given IV and p.o. antihypertensive medication with a decrease in his blood pressure noted to the 126/75.  Patient states he also had complete resolution of his symptoms with this.  In addition his heart rate was noted to decrease to 88.  History exam is not consistent with aortic dissection, ACS, PE, CVA, sepsis, meningitis, or other acute life-threatening etiology.  Patient discharged in stable condition.  Strict return precautions advised and discussed.  Instructed to follow-up with PCP in 3 to 5 days.  Final Clinical Impressions(s) / ED Diagnoses   Final diagnoses:  Chest pain, unspecified type  Hypertensive urgency    ED Discharge Orders         Ordered    lisinopril (PRINIVIL,ZESTRIL) 10 MG tablet  Daily     06/25/18 2225             Hulan Saas, MD 06/25/18 2231    Isla Pence, MD 06/25/18 2238

## 2018-06-25 NOTE — ED Notes (Signed)
Patient transported to X-ray 

## 2018-06-25 NOTE — ED Triage Notes (Signed)
Onset 2 days sudden constant left chest and left arm pain, shortness of breath, and nausea.  Left arm tingling.  Laying down makes better.   Pt took ASA 81 mg

## 2018-07-19 ENCOUNTER — Emergency Department (HOSPITAL_COMMUNITY): Payer: BLUE CROSS/BLUE SHIELD

## 2018-07-19 ENCOUNTER — Inpatient Hospital Stay (HOSPITAL_COMMUNITY)
Admission: EM | Admit: 2018-07-19 | Discharge: 2018-07-20 | DRG: 684 | Disposition: A | Discharge status: Home / Self Care | Payer: BLUE CROSS/BLUE SHIELD | Attending: Internal Medicine | Admitting: Internal Medicine

## 2018-07-19 DIAGNOSIS — Z794 Long term (current) use of insulin: Secondary | ICD-10-CM

## 2018-07-19 DIAGNOSIS — E876 Hypokalemia: Secondary | ICD-10-CM | POA: Diagnosis present

## 2018-07-19 DIAGNOSIS — N179 Acute kidney failure, unspecified: Principal | ICD-10-CM | POA: Diagnosis present

## 2018-07-19 DIAGNOSIS — I10 Essential (primary) hypertension: Secondary | ICD-10-CM | POA: Diagnosis present

## 2018-07-19 DIAGNOSIS — K529 Noninfective gastroenteritis and colitis, unspecified: Secondary | ICD-10-CM

## 2018-07-19 DIAGNOSIS — Z7984 Long term (current) use of oral hypoglycemic drugs: Secondary | ICD-10-CM

## 2018-07-19 DIAGNOSIS — Z6836 Body mass index (BMI) 36.0-36.9, adult: Secondary | ICD-10-CM | POA: Diagnosis not present

## 2018-07-19 DIAGNOSIS — E669 Obesity, unspecified: Secondary | ICD-10-CM | POA: Diagnosis present

## 2018-07-19 DIAGNOSIS — E7800 Pure hypercholesterolemia, unspecified: Secondary | ICD-10-CM | POA: Diagnosis present

## 2018-07-19 DIAGNOSIS — R079 Chest pain, unspecified: Secondary | ICD-10-CM | POA: Diagnosis not present

## 2018-07-19 DIAGNOSIS — I959 Hypotension, unspecified: Secondary | ICD-10-CM | POA: Diagnosis present

## 2018-07-19 DIAGNOSIS — R0602 Shortness of breath: Secondary | ICD-10-CM | POA: Diagnosis not present

## 2018-07-19 DIAGNOSIS — E119 Type 2 diabetes mellitus without complications: Secondary | ICD-10-CM | POA: Diagnosis not present

## 2018-07-19 DIAGNOSIS — E785 Hyperlipidemia, unspecified: Secondary | ICD-10-CM | POA: Diagnosis not present

## 2018-07-19 DIAGNOSIS — Z833 Family history of diabetes mellitus: Secondary | ICD-10-CM

## 2018-07-19 LAB — CBC
HCT: 37.9 % — ABNORMAL LOW (ref 39.0–52.0)
Hemoglobin: 12.9 g/dL — ABNORMAL LOW (ref 13.0–17.0)
MCH: 28.7 pg (ref 26.0–34.0)
MCHC: 34 g/dL (ref 30.0–36.0)
MCV: 84.2 fL (ref 80.0–100.0)
Platelets: 260 10*3/uL (ref 150–400)
RBC: 4.5 MIL/uL (ref 4.22–5.81)
RDW: 13 % (ref 11.5–15.5)
WBC: 7.3 10*3/uL (ref 4.0–10.5)
nRBC: 0 % (ref 0.0–0.2)

## 2018-07-19 LAB — URINALYSIS, ROUTINE W REFLEX MICROSCOPIC
Bacteria, UA: NONE SEEN
Bilirubin Urine: NEGATIVE
Glucose, UA: NEGATIVE mg/dL
Ketones, ur: NEGATIVE mg/dL
Leukocytes, UA: NEGATIVE
Nitrite: NEGATIVE
Protein, ur: NEGATIVE mg/dL
Specific Gravity, Urine: 1.01 (ref 1.005–1.030)
pH: 5 (ref 5.0–8.0)

## 2018-07-19 LAB — BASIC METABOLIC PANEL
Anion gap: 11 (ref 5–15)
BUN: 82 mg/dL — ABNORMAL HIGH (ref 6–20)
CO2: 29 mmol/L (ref 22–32)
Calcium: 9.6 mg/dL (ref 8.9–10.3)
Chloride: 100 mmol/L (ref 98–111)
Creatinine, Ser: 2.64 mg/dL — ABNORMAL HIGH (ref 0.61–1.24)
GFR calc Af Amer: 35 mL/min — ABNORMAL LOW (ref >60)
GFR calc non Af Amer: 30 mL/min — ABNORMAL LOW (ref >60)
Glucose, Bld: 81 mg/dL (ref 70–99)
Potassium: 3.4 mmol/L — ABNORMAL LOW (ref 3.5–5.1)
Sodium: 140 mmol/L (ref 135–145)

## 2018-07-19 LAB — TROPONIN I: Troponin I: <0.03 ng/mL (ref <0.03)

## 2018-07-19 LAB — HEPATIC FUNCTION PANEL
ALT: 24 U/L (ref 0–44)
AST: 27 U/L (ref 15–41)
Albumin: 4.1 g/dL (ref 3.5–5.0)
Alkaline Phosphatase: 65 U/L (ref 38–126)
Bilirubin, Direct: <0.1 mg/dL (ref 0.0–0.2)
Total Bilirubin: 0.7 mg/dL (ref 0.3–1.2)
Total Protein: 7.5 g/dL (ref 6.5–8.1)

## 2018-07-19 LAB — LIPASE, BLOOD: Lipase: 38 U/L (ref 11–51)

## 2018-07-19 LAB — I-STAT TROPONIN, ED
Troponin i, poc: 0.01 ng/mL (ref 0.00–0.08)
Troponin i, poc: 0 ng/mL (ref 0.00–0.08)

## 2018-07-19 LAB — CK: Total CK: 463 U/L — ABNORMAL HIGH (ref 49–397)

## 2018-07-19 MED ORDER — INSULIN ASPART 100 UNIT/ML ~~LOC~~ SOLN
0.0000 [IU] | Freq: Three times a day (TID) | SUBCUTANEOUS | Status: DC
  Administered 2018-07-20: 5 [IU] via SUBCUTANEOUS

## 2018-07-19 MED ORDER — SODIUM CHLORIDE 0.9 % IV BOLUS
1000.0000 mL | Freq: Once | INTRAVENOUS | Status: AC
  Administered 2018-07-19: 1000 mL via INTRAVENOUS

## 2018-07-19 MED ORDER — INSULIN ASPART 100 UNIT/ML ~~LOC~~ SOLN: 3.0000 [IU] | Freq: Three times a day (TID) | SUBCUTANEOUS | Status: DC

## 2018-07-19 MED ORDER — ONDANSETRON HCL 4 MG PO TABS: 4.0000 mg | ORAL_TABLET | Freq: Four times a day (QID) | ORAL | Status: DC | PRN

## 2018-07-19 MED ORDER — SODIUM CHLORIDE 0.9 % IV SOLN
INTRAVENOUS | Status: DC
  Administered 2018-07-20: 02:00:00 via INTRAVENOUS

## 2018-07-19 MED ORDER — INSULIN GLARGINE 100 UNIT/ML ~~LOC~~ SOLN
25.0000 [IU] | Freq: Every day | SUBCUTANEOUS | Status: DC
  Administered 2018-07-20: 25 [IU] via SUBCUTANEOUS
  Filled 2018-07-19: qty 0.25

## 2018-07-19 MED ORDER — SODIUM CHLORIDE 0.9% FLUSH
3.0000 mL | Freq: Once | INTRAVENOUS | Status: AC
  Administered 2018-07-19: 3 mL via INTRAVENOUS

## 2018-07-19 MED ORDER — ONDANSETRON HCL 4 MG/2ML IJ SOLN: 4.0000 mg | Freq: Four times a day (QID) | INTRAMUSCULAR | Status: DC | PRN

## 2018-07-19 MED ORDER — ATORVASTATIN CALCIUM 10 MG PO TABS
10.0000 mg | ORAL_TABLET | Freq: Every day | ORAL | Status: DC
  Administered 2018-07-20: 10 mg via ORAL
  Filled 2018-07-19: qty 1

## 2018-07-19 MED ORDER — ACETAMINOPHEN 325 MG PO TABS: 650.0000 mg | ORAL_TABLET | Freq: Four times a day (QID) | ORAL | Status: DC | PRN

## 2018-07-19 MED ORDER — ACETAMINOPHEN 650 MG RE SUPP: 650.0000 mg | Freq: Four times a day (QID) | RECTAL | Status: DC | PRN

## 2018-07-19 MED ORDER — HEPARIN SODIUM (PORCINE) 5000 UNIT/ML IJ SOLN
5000.0000 [IU] | Freq: Three times a day (TID) | INTRAMUSCULAR | Status: DC
  Administered 2018-07-20: 5000 [IU] via SUBCUTANEOUS
  Filled 2018-07-19 (×2): qty 1

## 2018-07-19 MED ORDER — AMLODIPINE BESYLATE 5 MG PO TABS: 5.0000 mg | ORAL_TABLET | Freq: Every day | ORAL | Status: DC

## 2018-07-19 MED ORDER — INSULIN GLARGINE 100 UNIT/ML ~~LOC~~ SOLN: 30.0000 [IU] | Freq: Every day | SUBCUTANEOUS | Status: DC

## 2018-07-19 NOTE — ED Provider Notes (Signed)
[facility] EMERGENCY DEPARTMENT Provider Note   CSN: [id number] Arrival date & time: 07/19/18  1618     History   Chief Complaint Chief Complaint  Patient presents with  . Chest Pain  . Emesis    HPI [name] is a 33 y.o. male with a past medical history of IDDM, hypertension, hyperlipidemia who presents to ED for left-sided chest pain/pressure with shortness of breath, nausea and 2 episodes of nonbloody, nonbilious emesis for the past 3 days.  Also reports intermittent dry cough.  States that he feels generalized fatigue and weak all over.  He has not eaten much today.  He does have history of similar symptoms in the past for which he has been seen for in this ED numerous times.  He is followed up with a cardiologist within the past 2 years with negative echo and stress test done.  He states that "they always tell me it is nothing but I just want to make sure."  He reports compliance with his home medications.  Denies any abdominal pain, changes to bowel movements or urination, fever, history of DVT, PE, MI or recent immobilization, recent strenuous exercise, seizure, increased ibuprofen or Tylenol use.  Denies pleuritic chest pain, alcohol, tobacco or other drug use.  HPI  Past Medical History:  Diagnosis Date  . Diabetes mellitus without complication ([place])   . Hypercholesteremia   . Hypertension     Patient Active Problem List   Diagnosis Date Noted  . Hyperlipidemia 01/14/2015  . Obesity (BMI 30-39.9) 01/14/2015  . Insulin-requiring or dependent type II diabetes mellitus ([place]) 01/14/2015  . Dyslipidemia 08/31/2014  . HTN (hypertension) 08/31/2014  . Diabetes ([place]) 08/15/2014    No past surgical history on file.      Home Medications    Prior to Admission medications   Medication Sig Start Date End Date Taking? Authorizing Provider  ACCU-CHEK GUIDE test strip FOR USE WHEN CHECKING BLOOD SUGARS TID 01/03/18   [provider]  amLODipine  (NORVASC) 5 MG tablet Take 1 tablet (5 mg total) by mouth daily. 08/13/17   Couture, Cortni S, PA-C  amoxicillin-clavulanate (AUGMENTIN) 875-125 MG tablet Take 1 tablet by mouth 2 (two) times daily. 03/07/18   Trula Slade, DPM  aspirin 81 MG tablet Take 1 tablet (81 mg total) by mouth daily. 08/25/17   Tobie Poet, DO  atorvastatin (LIPITOR) 10 MG tablet Take 1 tablet (10 mg total) by mouth daily. 08/13/17   Couture, Cortni S, PA-C  ciprofloxacin (CIPRO) 500 MG tablet Take 1 tablet (500 mg total) by mouth 2 (two) times daily. 01/09/18   Trula Slade, DPM  clindamycin (CLEOCIN) 300 MG capsule Take 1 capsule (300 mg total) by mouth 4 (four) times daily. 02/01/18   Trula Slade, DPM  LANTUS SOLOSTAR 100 UNIT/ML Solostar Pen Inject 45 Units into the skin every morning.  08/09/14   [provider]  lisinopril (PRINIVIL,ZESTRIL) 10 MG tablet TAKE 1 TABLET BY MOUTH ONCE DAILY. 11/08/14   [provider]  lisinopril (PRINIVIL,ZESTRIL) 10 MG tablet TK 1 T PO QD 02/03/18   [provider]  lisinopril (PRINIVIL,ZESTRIL) 10 MG tablet Take 1 tablet (10 mg total) by mouth daily. 06/25/18   Hulan Saas, MD  metFORMIN (GLUCOPHAGE-XR) 500 MG 24 hr tablet Take 500 mg by mouth 2 (two) times daily.    [provider]  NOVOLOG FLEXPEN 100 UNIT/ML FlexPen INJ 10 UNI [place] UP TO TID OR B MEALS 02/24/18  [provider]  omeprazole (PRILOSEC) 20 MG capsule Take 1 capsule (20 mg total) by mouth daily. 08/25/17   Tobie Poet, DO  oxyCODONE-acetaminophen (PERCOCET/ROXICET) 5-325 MG tablet Take 1-2 tablets by mouth every 6 (six) hours as needed for severe pain. 02/01/18   Trula Slade, DPM  PREVIDENT 5000 PLUS 1.1 % CREA dental cream U UTD BID 03/08/18   [provider]  promethazine (PHENERGAN) 25 MG tablet Take 1 tablet (25 mg total) by mouth every 8 (eight) hours as needed for nausea or vomiting. 02/01/18   Trula Slade, DPM    Family History Family  History  Problem Relation Age of Onset  . Diabetes Father     Social History Social History   Tobacco Use  . Smoking status: Never Smoker  . Smokeless tobacco: Never Used  Substance Use Topics  . Alcohol use: No  . Drug use: No     Allergies   Patient has no known allergies.   Review of Systems Review of Systems  Constitutional: Negative for appetite change, chills and fever.  HENT: Negative for ear pain, rhinorrhea, sneezing and sore throat.   Eyes: Negative for photophobia and visual disturbance.  Respiratory: Positive for shortness of breath. Negative for cough, chest tightness and wheezing.   Cardiovascular: Positive for chest pain. Negative for palpitations.  Gastrointestinal: Positive for nausea and vomiting. Negative for abdominal pain, blood in stool, constipation and diarrhea.  Genitourinary: Negative for dysuria, hematuria and urgency.  Musculoskeletal: Negative for myalgias.  Skin: Negative for rash.  Neurological: Negative for dizziness, weakness and light-headedness.     Physical Exam Updated Vital Signs BP (!) 102/57   Pulse 78   Temp 98.2 F (36.8 C) (Oral)   Resp 14   SpO2 99%   Physical Exam Vitals signs and nursing note reviewed.  Constitutional:      General: He is not in acute distress.    Appearance: He is well-developed.  HENT:     Head: Normocephalic and atraumatic.     Nose: Nose normal.  Eyes:     General: No scleral icterus.       Right eye: No discharge.        Left eye: No discharge.     Conjunctiva/sclera: Conjunctivae normal.     Pupils: Pupils are equal, round, and reactive to light.  Neck:     Musculoskeletal: Normal range of motion and neck supple.  Cardiovascular:     Rate and Rhythm: Normal rate and regular rhythm.     Heart sounds: Normal heart sounds. No murmur. No friction rub. No gallop.   Pulmonary:     Effort: Pulmonary effort is normal. No respiratory distress.     Breath sounds: Normal breath sounds.    Abdominal:     General: Bowel sounds are normal. There is no distension.     Palpations: Abdomen is soft.     Tenderness: There is no abdominal tenderness. There is no guarding.     Comments: Abdomen is soft, nontender nondistended.  Musculoskeletal: Normal range of motion.     Comments: No lower extremity edema, erythema or calf tenderness bilaterally.  Skin:    General: Skin is warm and dry.     Findings: No rash.  Neurological:     Mental Status: He is alert.     Motor: No abnormal muscle tone.     Coordination: Coordination normal.      ED Treatments / Results  Labs (all labs ordered are  listed, but only abnormal results are displayed) Labs Reviewed  BASIC METABOLIC PANEL - Abnormal; Notable for the following components:      Result Value   Potassium 3.4 (*)    BUN 82 (*)    Creatinine, Ser 2.64 (*)    GFR calc non Af Amer 30 (*)    GFR calc Af Amer 35 (*)    All other components within normal limits  CBC - Abnormal; Notable for the following components:   Hemoglobin 12.9 (*)    HCT 37.9 (*)    All other components within normal limits  CK - Abnormal; Notable for the following components:   Total CK 463 (*)    All other components within normal limits  LIPASE, BLOOD  HEPATIC FUNCTION PANEL  URINALYSIS, ROUTINE W REFLEX MICROSCOPIC  BASIC METABOLIC PANEL  I-STAT TROPONIN, ED  I-STAT TROPONIN, ED    EKG EKG Interpretation  Date/Time:  Wednesday July 19 2018 16:21:29 EST Ventricular Rate:  88 PR Interval:  154 QRS Duration: 106 QT Interval:  380 QTC Calculation: 459 R Axis:   67 Text Interpretation:  Normal sinus rhythm Normal ECG When compared to prior,  no significant changes seen.  No STEMI Confirmed by Antony Blackbird [phone]) on 07/19/2018 6:46:14 PM   Radiology Dg Chest 2 View  Result Date: 07/19/2018 CLINICAL DATA:  Chest pain and shortness of breath for 2 days. EXAM: CHEST - 2 VIEW COMPARISON:  Chest radiograph June 25, 2018 FINDINGS:  Cardiomediastinal silhouette is normal. No pleural effusions or focal consolidations. Trachea projects midline and there is no pneumothorax. Soft tissue planes and included osseous structures are non-suspicious. Large body habitus. IMPRESSION: Negative. Electronically Signed   By: Elon Alas M.D.   On: 07/19/2018 18:02    Procedures Procedures (including critical care time)  Medications Ordered in ED Medications  sodium chloride 0.9 % bolus 1,000 mL (has no administration in time range)  sodium chloride flush (NS) 0.9 % injection 3 mL (3 mLs Intravenous Given 07/19/18 1745)  sodium chloride 0.9 % bolus 1,000 mL (1,000 mLs Intravenous New Bag/Given 07/19/18 1745)     Initial Impression / Assessment and Plan / ED Course  I have reviewed the triage vital signs and the nursing notes.  Pertinent labs & imaging results that were available during my care of the patient were reviewed by me and considered in my medical decision making (see chart for details).  Clinical Course as of Jul 19 1954  Wed Jul 19, 2018  1859 Creatinine(!): 2.64 [HK]  1859 BUN(!): 82 [HK]    Clinical Course User Index [HK] Delia Heady, PA-C    33 year old male with a past medical history of insulin-dependent diabetes, hypertension, hyperlipidemia presents to ED for chest pain, nausea, vomiting, generalized fatigue and weakness.  Seen in the ED for similar symptoms in the past with negative and reassuring work-up.  Denies abdominal pain, recent strenuous exercise, recent immobilization, history of DVT, PE or MI.  On my exam he is overall well-appearing.  EKG shows normal sinus rhythm.  Initial and delta troponin are both negative.  CMP significant for creatinine of 2.6, BUN of 82 both of which are significantly higher than several weeks ago.  CK mildly elevated at 480.  Chest x-ray is unremarkable.  Patient will be admitted to hospitalist for management of AKI.    Portions of this note were generated with Geographical information systems officer. Dictation errors may occur despite best attempts at proofreading.   Final Clinical Impressions(s) /  ED Diagnoses   Final diagnoses:  AKI (acute kidney injury) [facility])    ED Discharge Orders    None       Delia Heady, PA-C 07/19/18 1956    Tegeler, Gwenyth Allegra, MD 07/20/18 [phone]

## 2018-07-19 NOTE — ED Notes (Signed)
Urine culture save tube sent with urine 

## 2018-07-19 NOTE — H&P (Signed)
History and Physical    [name] YKZ:[id number] DOB: 1986/04/18 DOA: 07/19/2018  PCP: [name], FNP  Patient coming from: Home  I have personally briefly reviewed patient's old medical records in [place]  Chief Complaint: CP, emesis  HPI: [name] is a 33 y.o. male with medical history significant of DM2 on insulin, HTN, HLD.  Patient presents to the ED for left-sided chest pain/pressure with shortness of breath, nausea and 2 episodes of nonbloody, nonbilious emesis for the past 3 days.  Poor PO intake today.  He does have history of similar symptoms in the past for which he has been seen for in this ED numerous times.  He is followed up with a cardiologist within the past 2 years with negative echo and stress test done.  Recently resumed lisinopril in Jan.   ED Course: AKI with BUN 82, creat 2.6 up from 35 and 1.2 in Jan.  Patient given 2L NS and hospitalist asked to admit.   Review of Systems: As per HPI otherwise 10 point review of systems negative.   Past Medical History:  Diagnosis Date  . Diabetes mellitus without complication ([place])   . Hypercholesteremia   . Hypertension     No past surgical history on file.   reports that he has never smoked. He has never used smokeless tobacco. He reports that he does not drink alcohol or use drugs.  No Known Allergies  Family History  Problem Relation Age of Onset  . Diabetes Father      Prior to Admission medications   Medication Sig Start Date End Date Taking? Authorizing Provider  ACCU-CHEK GUIDE test strip FOR USE WHEN CHECKING BLOOD SUGARS TID 01/03/18   [provider]  amLODipine (NORVASC) 5 MG tablet Take 1 tablet (5 mg total) by mouth daily. 08/13/17   Couture, Cortni S, PA-C  aspirin 81 MG tablet Take 1 tablet (81 mg total) by mouth daily. 08/25/17   Tobie Poet, DO  atorvastatin (LIPITOR) 10 MG tablet Take 1 tablet (10 mg total) by mouth daily. 08/13/17   Couture, Cortni S, PA-C  LANTUS  SOLOSTAR 100 UNIT/ML Solostar Pen Inject 45 Units into the skin every morning.  08/09/14   [provider]  lisinopril (PRINIVIL,ZESTRIL) 10 MG tablet TAKE 1 TABLET BY MOUTH ONCE DAILY. 11/08/14   [provider]  lisinopril (PRINIVIL,ZESTRIL) 10 MG tablet TK 1 T PO QD 02/03/18   [provider]  lisinopril (PRINIVIL,ZESTRIL) 10 MG tablet Take 1 tablet (10 mg total) by mouth daily. 06/25/18   Hulan Saas, MD  metFORMIN (GLUCOPHAGE-XR) 500 MG 24 hr tablet Take 500 mg by mouth 2 (two) times daily.    [provider]  NOVOLOG FLEXPEN 100 UNIT/ML FlexPen INJ 10 UNI [place] UP TO TID OR B MEALS 02/24/18   [provider]  omeprazole (PRILOSEC) 20 MG capsule Take 1 capsule (20 mg total) by mouth daily. 08/25/17   Tobie Poet, DO  PREVIDENT 5000 PLUS 1.1 % CREA dental cream U UTD BID 03/08/18   [provider]  promethazine (PHENERGAN) 25 MG tablet Take 1 tablet (25 mg total) by mouth every 8 (eight) hours as needed for nausea or vomiting. 02/01/18   Trula Slade, DPM    Physical Exam: Vitals:   07/19/18 1815 07/19/18 1830 07/19/18 1845 07/19/18 1900  BP: 96/64 (!) 102/57    Pulse: 78 76 77 78  Resp: 14 18 13 14   Temp:  TempSrc:      SpO2: 99% 99% 99% 99%    Constitutional: NAD, calm, comfortable Eyes: PERRL, lids and conjunctivae normal ENMT: Mucous membranes are moist. Posterior pharynx clear of any exudate or lesions.Normal dentition.  Neck: normal, supple, no masses, no thyromegaly Respiratory: clear to auscultation bilaterally, no wheezing, no crackles. Normal respiratory effort. No accessory muscle use.  Cardiovascular: Regular rate and rhythm, no murmurs / rubs / gallops. No extremity edema. 2+ pedal pulses. No carotid bruits.  Abdomen: no tenderness, no masses palpated. No hepatosplenomegaly. Bowel sounds positive.  Musculoskeletal: no clubbing / cyanosis. No joint deformity upper and lower extremities. Good ROM, no contractures.  Normal muscle tone.  Skin: no rashes, lesions, ulcers. No induration Neurologic: CN 2-12 grossly intact. Sensation intact, DTR normal. Strength 5/5 in all 4.  Psychiatric: Normal judgment and insight. Alert and oriented x 3. Normal mood.    Labs on Admission: I have personally reviewed following labs and imaging studies  CBC: Recent Labs  Lab 07/19/18 1629  WBC 7.3  HGB 12.9*  HCT 37.9*  MCV 84.2  PLT 371   Basic Metabolic Panel: Recent Labs  Lab 07/19/18 1629  NA 140  K 3.4*  CL 100  CO2 29  GLUCOSE 81  BUN 82*  CREATININE 2.64*  CALCIUM 9.6   GFR: CrCl cannot be calculated (Unknown ideal weight.). Liver Function Tests: Recent Labs  Lab 07/19/18 1854  AST 27  ALT 24  ALKPHOS 65  BILITOT 0.7  PROT 7.5  ALBUMIN 4.1   Recent Labs  Lab 07/19/18 1629  LIPASE 38   No results for input(s): AMMONIA in the last 168 hours. Coagulation Profile: No results for input(s): INR, PROTIME in the last 168 hours. Cardiac Enzymes: Recent Labs  Lab 07/19/18 1854  CKTOTAL 463*   BNP (last 3 results) No results for input(s): PROBNP in the last 8760 hours. HbA1C: No results for input(s): HGBA1C in the last 72 hours. CBG: No results for input(s): GLUCAP in the last 168 hours. Lipid Profile: No results for input(s): CHOL, HDL, LDLCALC, TRIG, CHOLHDL, LDLDIRECT in the last 72 hours. Thyroid Function Tests: No results for input(s): TSH, T4TOTAL, FREET4, T3FREE, THYROIDAB in the last 72 hours. Anemia Panel: No results for input(s): VITAMINB12, FOLATE, FERRITIN, TIBC, IRON, RETICCTPCT in the last 72 hours. Urine analysis: No results found for: COLORURINE, APPEARANCEUR, LABSPEC, PHURINE, GLUCOSEU, HGBUR, BILIRUBINUR, KETONESUR, PROTEINUR, UROBILINOGEN, NITRITE, LEUKOCYTESUR  Radiological Exams on Admission: Dg Chest 2 View  Result Date: 07/19/2018 CLINICAL DATA:  Chest pain and shortness of breath for 2 days. EXAM: CHEST - 2 VIEW COMPARISON:  Chest radiograph June 25, 2018 FINDINGS: Cardiomediastinal silhouette is normal. No pleural effusions or focal consolidations. Trachea projects midline and there is no pneumothorax. Soft tissue planes and included osseous structures are non-suspicious. Large body habitus. IMPRESSION: Negative. Electronically Signed   By: Elon Alas M.D.   On: 07/19/2018 18:02    EKG: Independently reviewed.  Assessment/Plan Principal Problem:   AKI (acute kidney injury) ([place]) Active Problems:   HTN (hypertension)   Insulin-requiring or dependent type II diabetes mellitus ([place])   Chest pain    1. AKI - 1. Hold lisinopril 2. IVF: 2L bolus in ED and 125 cc/hr 3. Strict intake and output 4. UA pending 5. Needs renal US, urine lytes, nephro consult, etc if not improving by AM 2. HTN - 1. Hold lisinopril 2. BP currently 062 systolic 3. DM2 - 1. Half home lantus to 25u QHS for tonight  given AKI 2. Mod scale SSI AC 3. 3U novolog AC 4. Chest pain - 1. 2d echo ordered 2. Tele monitor 3. Serial trops  DVT prophylaxis: Heparin [place] Code Status: Full Family Communication: Family at bedside Disposition Plan: Home after admit Consults called: None Admission status: Admit to inpatient  Severity of Illness: The appropriate patient status for this patient is INPATIENT. Inpatient status is judged to be reasonable and necessary in order to provide the required intensity of service to ensure the patient's safety. The patient's presenting symptoms, physical exam findings, and initial radiographic and laboratory data in the context of their chronic comorbidities is felt to place them at high risk for further clinical deterioration. Furthermore, it is not anticipated that the patient will be medically stable for discharge from the hospital within 2 midnights of admission. The following factors support the patient status of inpatient.    " The initial radiographic and laboratory data are worrisome because of AKI with creat 2.6 up from  baseline 1.2 just last month. " The chronic co-morbidities include DM2, HTN.   * I certify that at the point of admission it is my clinical judgment that the patient will require inpatient hospital care spanning beyond 2 midnights from the point of admission due to high intensity of service, high risk for further deterioration and high frequency of surveillance required.*    Garrin Kirwan M. DO [facility]  How to contact the [facility] Attending or Consulting provider [place] or covering provider during after hours [place], for this patient?  1. Check the care team in [facility] and look for a) attending/consulting [facility] provider listed and b) the [facility] team listed 2. Log into [email or web address]  Amion Physician Scheduling and messaging for groups and whole hospitals  On call and physician scheduling software for group practices, residents, hospitalists and other medical providers for call, clinic, rotation and shift schedules. OnCall [employer] is a hospital-wide system for scheduling doctors and paging doctors on call. EasyPlot is for scientific plotting and data analysis.  [email or web address]  and use [place]'s universal password to access. If you do not have the password, please contact the hospital operator.  3. Locate the [facility] provider you are looking for under [facility] and page to a number that you can be directly reached. 4. If you still have difficulty reaching the provider, please page the [facility] (Director on Call) for the Hospitalists listed on amion for assistance.  07/19/2018, 8:36 PM

## 2018-07-19 NOTE — ED Triage Notes (Signed)
Pt endorses generalized chest pain with shob and n/v x 2 days. Has been here for same in the past.

## 2018-07-20 ENCOUNTER — Other Ambulatory Visit (HOSPITAL_COMMUNITY): Payer: BLUE CROSS/BLUE SHIELD

## 2018-07-20 DIAGNOSIS — E669 Obesity, unspecified: Secondary | ICD-10-CM

## 2018-07-20 DIAGNOSIS — K529 Noninfective gastroenteritis and colitis, unspecified: Secondary | ICD-10-CM

## 2018-07-20 LAB — HIV ANTIBODY (ROUTINE TESTING W REFLEX): HIV Screen 4th Generation wRfx: NONREACTIVE

## 2018-07-20 LAB — BASIC METABOLIC PANEL
Anion gap: 12 (ref 5–15)
BUN: 68 mg/dL — ABNORMAL HIGH (ref 6–20)
CO2: 25 mmol/L (ref 22–32)
Calcium: 8.9 mg/dL (ref 8.9–10.3)
Chloride: 103 mmol/L (ref 98–111)
Creatinine, Ser: 1.81 mg/dL — ABNORMAL HIGH (ref 0.61–1.24)
GFR calc Af Amer: 55 mL/min — ABNORMAL LOW (ref >60)
GFR calc non Af Amer: 47 mL/min — ABNORMAL LOW (ref >60)
Glucose, Bld: 120 mg/dL — ABNORMAL HIGH (ref 70–99)
Potassium: 3.3 mmol/L — ABNORMAL LOW (ref 3.5–5.1)
Sodium: 140 mmol/L (ref 135–145)

## 2018-07-20 LAB — GLUCOSE, CAPILLARY
Glucose-Capillary: 126 mg/dL — ABNORMAL HIGH (ref 70–99)
Glucose-Capillary: 116 mg/dL — ABNORMAL HIGH (ref 70–99)
Glucose-Capillary: 212 mg/dL — ABNORMAL HIGH (ref 70–99)

## 2018-07-20 LAB — MAGNESIUM: Magnesium: 2.7 mg/dL — ABNORMAL HIGH (ref 1.7–2.4)

## 2018-07-20 LAB — TROPONIN I: Troponin I: <0.03 ng/mL (ref <0.03)

## 2018-07-20 MED ORDER — INSULIN ASPART 100 UNIT/ML ~~LOC~~ SOLN
5.0000 [IU] | Freq: Three times a day (TID) | SUBCUTANEOUS | Status: DC
  Administered 2018-07-20 (×2): 5 [IU] via SUBCUTANEOUS

## 2018-07-20 MED ORDER — OMEPRAZOLE 20 MG PO CPDR: 20.0000 mg | DELAYED_RELEASE_CAPSULE | Freq: Every day | ORAL | 0 refills | Status: DC

## 2018-07-20 MED ORDER — POTASSIUM CHLORIDE CRYS ER 20 MEQ PO TBCR
40.0000 meq | EXTENDED_RELEASE_TABLET | ORAL | Status: DC
  Administered 2018-07-20: 40 meq via ORAL
  Filled 2018-07-20: qty 2

## 2018-07-20 MED ORDER — INSULIN ASPART 100 UNIT/ML ~~LOC~~ SOLN: 5.0000 [IU] | Freq: Three times a day (TID) | SUBCUTANEOUS | Status: DC

## 2018-07-20 NOTE — Discharge Summary (Signed)
Physician Discharge Summary  [name] JEH:[id number] DOB: 06/10/1986 DOA: 07/19/2018  PCP: [name], FNP  Admit date: 07/19/2018 Discharge date: 07/20/2018  Admitted From: home Disposition:  home   Recommendations for Outpatient Follow-up:  1. Suspect chest pain is GI related- recommend continuing a daily PPI for now 2. Bmet on Monday to check Cr and K+    Discharge Condition:  stable   CODE STATUS:  Full code   Consultations:  none    Discharge Diagnoses:  Principal Problem:   AKI (acute kidney injury) ([place]) Active Problems:   Chest pain   Gastroenteritis   HTN (hypertension)   Obesity (BMI 30-39.9)   Insulin-requiring or dependent type II diabetes mellitus ([place])      Brief Summary: [name] is a 33 y.o. male with medical history significant of DM2 on insulin, HTN, HLD. He presents for chest pain, nausea and vomiting that started on Sunday. He described the chest pain as in the center of his chest radiating to b/l shoulders and nearly constant since Sunday. He had multiple daily episodes of vomiting and eventually stopped eating to prevent triggering the vomiting. He had one episode of loose stools. Noted to have AKI in the ED and thus admitted.  Hospital Course:  Chest pain - has had this in the past and has had a negative cardiac work up - paint this time was atypical for cardiac etiology - Troponin negative, EGK unrevealing - pain likely due to GI etiology- has resolved as of yesterday evening - due to prior negative cardiac work up, and ongoing intermittent chest pain, would recommend daily Prilosec- have prescribed this  Gastroenteritis - he did not receive any treatment for his GI symptoms but they have resolved completely - today he had solid food and has not has recurrence of nausea, vomiting or the chest pain- likely had a gastroenteritis  AKI - prerenal in setting of poor oral intake/ vomiting - Holding Lisinopril - has improved with IVF - Cr  2.64 yesterday> now 1.81- baseline ~ 1.2 - have asked him to continue to hydrate himself at home and have bmet checked on Monday to ensure it has normalized   Hypotension with h/o HTN - hold Lisinopril as mentioned above  Hypokalemia - due to GI losses- replaced- f/u on Monday  DM2 - controlled- he states his last A1c was ~ 5- cont home meds  Obesity - Body mass index is 36.27 kg/m. - working on losing weight   Discharge Exam: Vitals:   07/20/18 0036 07/20/18 0443  BP: (!) 100/56 113/62  Pulse: 71 76  Resp: 18 18  Temp: 97.7 F (36.5 C) 98.3 F (36.8 C)  SpO2: 98% 99%   Vitals:   07/19/18 1900 07/19/18 2139 07/20/18 0036 07/20/18 0443  BP:  116/75 (!) 100/56 113/62  Pulse: 78 84 71 76  Resp: 14 18 18 18   Temp:  97.8 F (36.6 C) 97.7 F (36.5 C) 98.3 F (36.8 C)  TempSrc:  Oral Oral Oral  SpO2: 99% 100% 98% 99%  Weight:  (!) 137.3 kg  (!) 138.8 kg  Height:  6\' 5"  (1.956 m)      General: Pt is alert, awake, not in acute distress Cardiovascular: RRR, S1/S2 +, no rubs, no gallops Respiratory: CTA bilaterally, no wheezing, no rhonchi Abdominal: Soft, NT, ND, bowel sounds + Extremities: no edema, no cyanosis   Discharge Instructions  Discharge Instructions    Diet - low sodium heart healthy   Complete  by:  As directed    Diet Carb Modified   Complete by:  As directed    Discharge instructions   Complete by:  As directed    I feel that your chest pain may be related to your GI tract (stomach/ esophagus). I recommend daily Prilosec as prescribed.  Please see your PCP on Monday for the following blood work: Bmet- drink plenty of water (about 1.5-2 L) for the next 2-3 days. As your BP is low and your kidney function was affected, do not take Lisinopril for now. Talk with your doctor on Monday about resumption.   Increase activity slowly   Complete by:  As directed      Allergies as of 07/20/2018   No Known Allergies     Medication List    STOP taking these  medications   ibuprofen 200 MG tablet Commonly known as:  ADVIL,MOTRIN   lisinopril 10 MG tablet Commonly known as:  PRINIVIL,ZESTRIL     TAKE these medications   ACCU-CHEK GUIDE test strip Generic drug:  glucose blood 1 each by Other route 3 (three) times daily.   aspirin 81 MG tablet Take 1 tablet (81 mg total) by mouth daily.   atorvastatin 10 MG tablet Commonly known as:  LIPITOR Take 1 tablet (10 mg total) by mouth daily.   LANTUS SOLOSTAR 100 UNIT/ML Solostar Pen Generic drug:  Insulin Glargine Inject 45-50 Units into the skin at bedtime.   NOVOLOG FLEXPEN 100 UNIT/ML FlexPen Generic drug:  insulin aspart Inject 5-10 Units into the skin See admin instructions. Inject 5-10 units into the skin three times a day before meals, per sliding scale   omeprazole 20 MG capsule Commonly known as:  PRILOSEC Take 1 capsule (20 mg total) by mouth daily.   PREVIDENT 5000 PLUS 1.1 % Crea dental cream Generic drug:  sodium fluoride Place 1 application onto teeth at bedtime.      Follow-up Information    [name], FNP. Schedule an appointment as soon as possible for a visit on 07/24/2018.   Specialty:  Family Medicine Why:  you need a Bmet on Monday. @10 :45pm Contact information: [address] [address] [phone]          No Known Allergies   Procedures/Studies:    Dg Chest 2 View  Result Date: 07/19/2018 CLINICAL DATA:  Chest pain and shortness of breath for 2 days. EXAM: CHEST - 2 VIEW COMPARISON:  Chest radiograph June 25, 2018 FINDINGS: Cardiomediastinal silhouette is normal. No pleural effusions or focal consolidations. Trachea projects midline and there is no pneumothorax. Soft tissue planes and included osseous structures are non-suspicious. Large body habitus. IMPRESSION: Negative. Electronically Signed   By: Elon Alas M.D.   On: 07/19/2018 18:02   Dg Chest 2 View  Result Date: 06/25/2018 CLINICAL DATA:  Chest pain EXAM:  CHEST - 2 VIEW COMPARISON:  09/13/2017 FINDINGS: The lungs are clear without focal pneumonia, edema, pneumothorax or pleural effusion. The cardiopericardial silhouette is within normal limits for size. The visualized bony structures of the thorax are intact. Telemetry leads overlie the chest. IMPRESSION: No active cardiopulmonary disease. Electronically Signed   By: Misty Stanley M.D.   On: 06/25/2018 20:14     The results of significant diagnostics from this hospitalization (including imaging, microbiology, ancillary and laboratory) are listed below for reference.     Microbiology: No results found for this or any previous visit (from the past 240 hour(s)).   Labs: BNP (last  3 results) No results for input(s): BNP in the last 8760 hours. Basic Metabolic Panel: Recent Labs  Lab 07/19/18 1629 07/20/18 0217 07/20/18 0749  NA 140 140  --   K 3.4* 3.3*  --   CL 100 103  --   CO2 29 25  --   GLUCOSE 81 120*  --   BUN 82* 68*  --   CREATININE 2.64* 1.81*  --   CALCIUM 9.6 8.9  --   MG  --   --  2.7*   Liver Function Tests: Recent Labs  Lab 07/19/18 1854  AST 27  ALT 24  ALKPHOS 65  BILITOT 0.7  PROT 7.5  ALBUMIN 4.1   Recent Labs  Lab 07/19/18 1629  LIPASE 38   No results for input(s): AMMONIA in the last 168 hours. CBC: Recent Labs  Lab 07/19/18 1629  WBC 7.3  HGB 12.9*  HCT 37.9*  MCV 84.2  PLT 260   Cardiac Enzymes: Recent Labs  Lab 07/19/18 1854 07/19/18 2020 07/20/18 0217  CKTOTAL 463*  --   --   TROPONINI  --  <0.03 <0.03   BNP: Invalid input(s): POCBNP CBG: Recent Labs  Lab 07/20/18 0223 07/20/18 0733 07/20/18 1134  GLUCAP 126* 116* 212*   D-Dimer No results for input(s): DDIMER in the last 72 hours. Hgb A1c No results for input(s): HGBA1C in the last 72 hours. Lipid Profile No results for input(s): CHOL, HDL, LDLCALC, TRIG, CHOLHDL, LDLDIRECT in the last 72 hours. Thyroid function studies No results for input(s): TSH, T4TOTAL, T3FREE,  THYROIDAB in the last 72 hours.  Invalid input(s): FREET3 Anemia work up No results for input(s): VITAMINB12, FOLATE, FERRITIN, TIBC, IRON, RETICCTPCT in the last 72 hours. Urinalysis    Component Value Date/Time   COLORURINE STRAW (A) 07/19/2018 2030   APPEARANCEUR CLEAR 07/19/2018 2030   LABSPEC 1.010 07/19/2018 2030   [place] 5.0 07/19/2018 2030   GLUCOSEU NEGATIVE 07/19/2018 2030   [place] (A) 07/19/2018 2030   [place] NEGATIVE 07/19/2018 2030   [place] 07/19/2018 2030   PROTEINUR NEGATIVE 07/19/2018 2030   NITRITE NEGATIVE 07/19/2018 2030   LEUKOCYTESUR NEGATIVE 07/19/2018 2030   Sepsis Labs Invalid input(s): PROCALCITONIN,  WBC,  LACTICIDVEN Microbiology No results found for this or any previous visit (from the past 240 hour(s)).   Time coordinating discharge in minutes: 65  SIGNED:   Debbe Odea, MD  [facility] 07/20/2018, 12:48 PM Pager   If 7PM-7AM, please contact night-coverage [email or web address] Password [email or web address]

## 2018-07-20 NOTE — Plan of Care (Signed)
  Problem: Education: Goal: Knowledge of General Education information will improve Description: Including pain rating scale, medication(s)/side effects and non-pharmacologic comfort measures Outcome: Progressing   Problem: Clinical Measurements: Goal: Ability to maintain clinical measurements within normal limits will improve Outcome: Progressing Goal: Will remain free from infection Outcome: Progressing   Problem: Activity: Goal: Risk for activity intolerance will decrease Outcome: Progressing   Problem: Safety: Goal: Ability to remain free from injury will improve Outcome: Progressing   

## 2018-07-20 NOTE — Discharge Instructions (Signed)
Drink plenty of water over the next few days. Take Prilosec daily in the AM as prescribed.   You were cared for by a hospitalist during your hospital stay. If you have any questions about your discharge medications or the care you received while you were in the hospital after you are discharged, you can call the unit and asked to speak with the hospitalist on call if the hospitalist that took care of you is not available. Once you are discharged, your primary care physician will handle any further medical issues.   Please note that NO REFILLS for any discharge medications will be authorized once you are discharged, as it is imperative that you return to your primary care physician (or establish a relationship with a primary care physician if you do not have one) for your aftercare needs so that they can reassess your need for medications and monitor your lab values.  Please take all your medications with you for your next visit with your Primary MD. Please ask your Primary MD to get all Hospital records sent to his/her office. Please request your Primary MD to go over all hospital test results at the follow up.   If you experience worsening of your admission symptoms, develop shortness of breath, chest pain, suicidal or homicidal thoughts or a life threatening emergency, you must seek medical attention immediately by calling 911 or calling your MD.   Dennis Bast must read the complete instructions/literature along with all the possible adverse reactions/side effects for all the medicines you take including new medications that have been prescribed to you. Take new medicines after you have completely understood and accpet all the possible adverse reactions/side effects.    Do not drive when taking pain medications or sedatives.     Do not take more than prescribed Pain, Sleep and Anxiety Medications   If you have smoked or chewed Tobacco in the last 2 yrs please stop. Stop any regular alcohol  and or  recreational drug use.   Wear Seat belts while driving.

## 2018-08-06 IMAGING — CR DG CHEST 2V
2 series · 2 of 2 positions shown · non-contrast
Comparison: PA and lateral chest 08/25/2017 and 10/16/2016.

CLINICAL DATA: Left upper chest pain today. The patient experienced
a similar episode 1 month ago.

EXAM:
CHEST - 2 VIEW

[w chest pa]
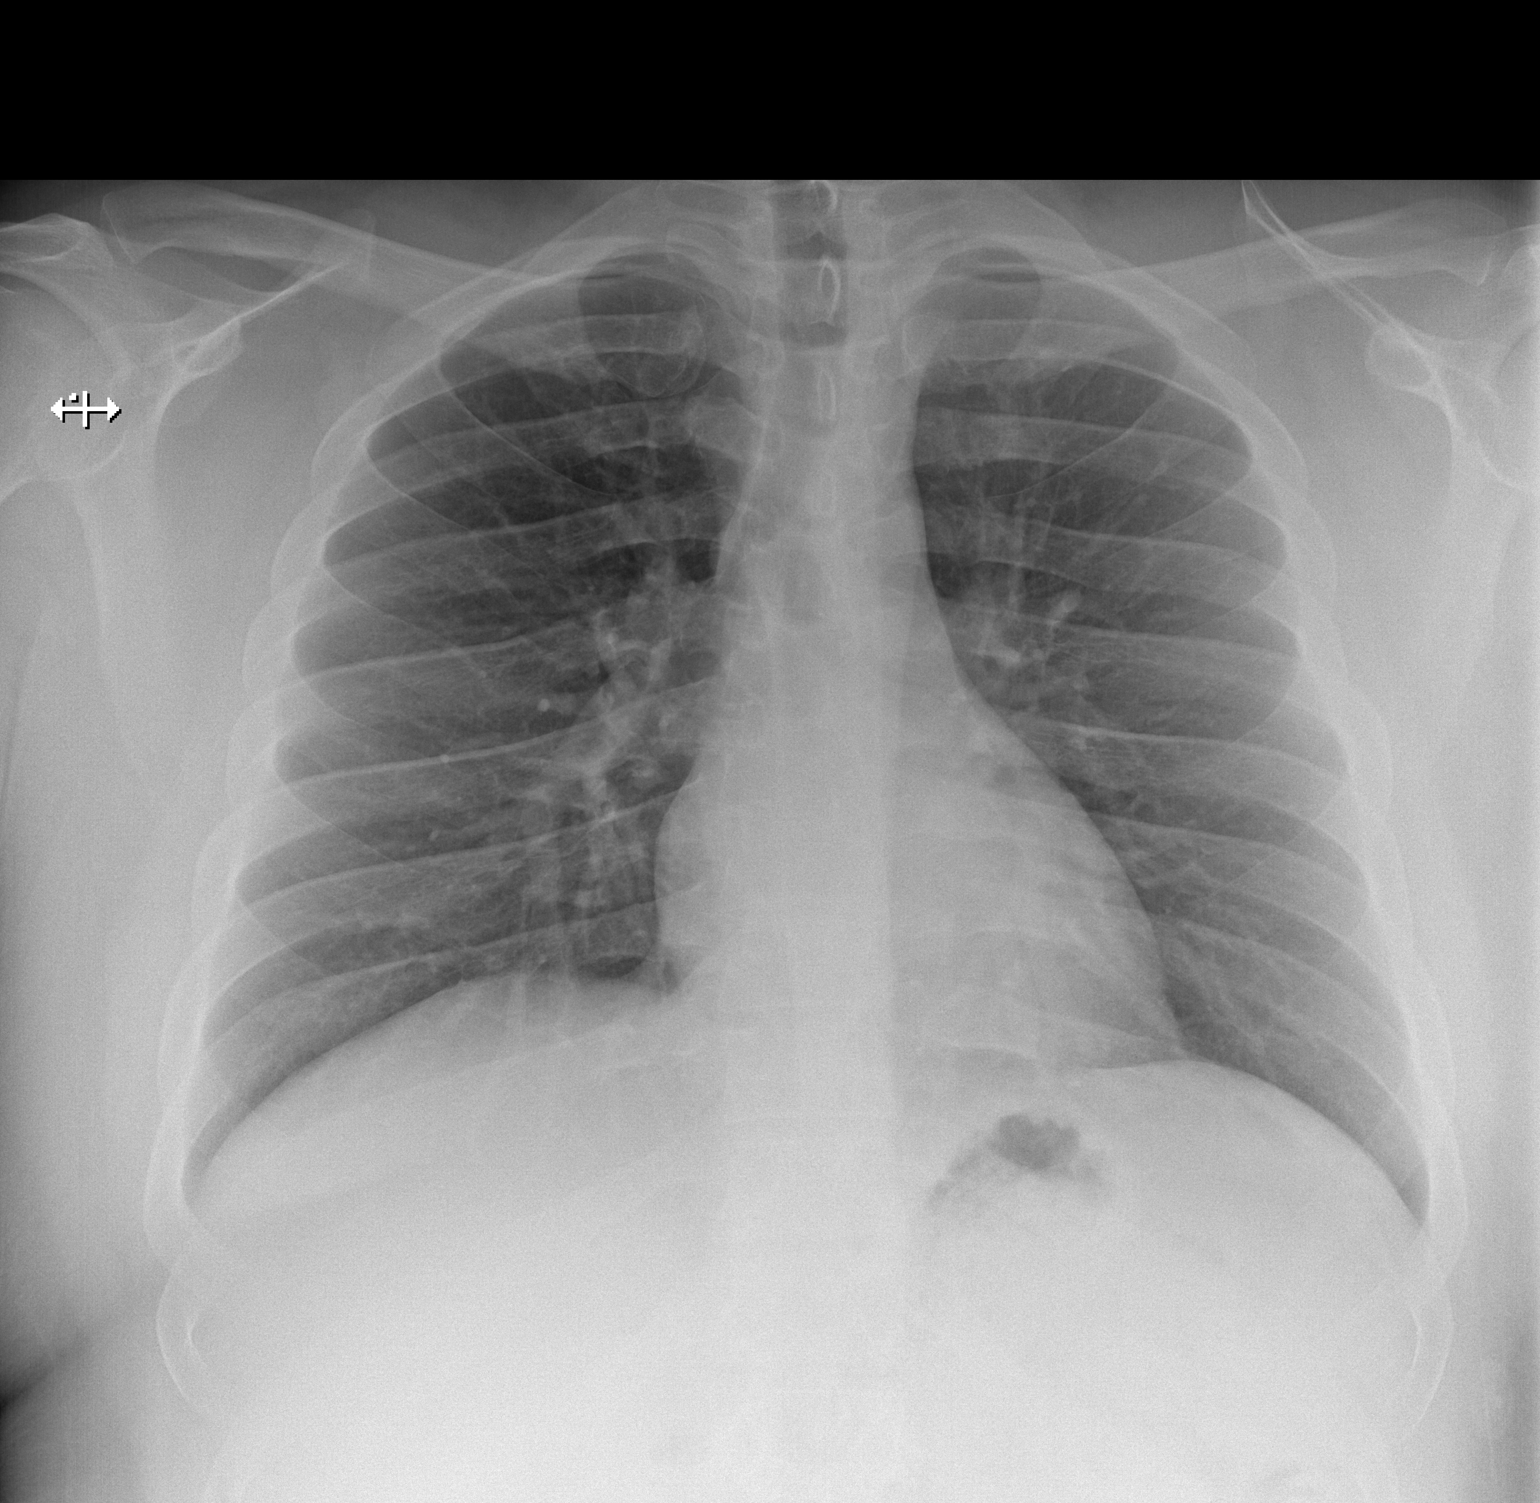

[w chest lat]
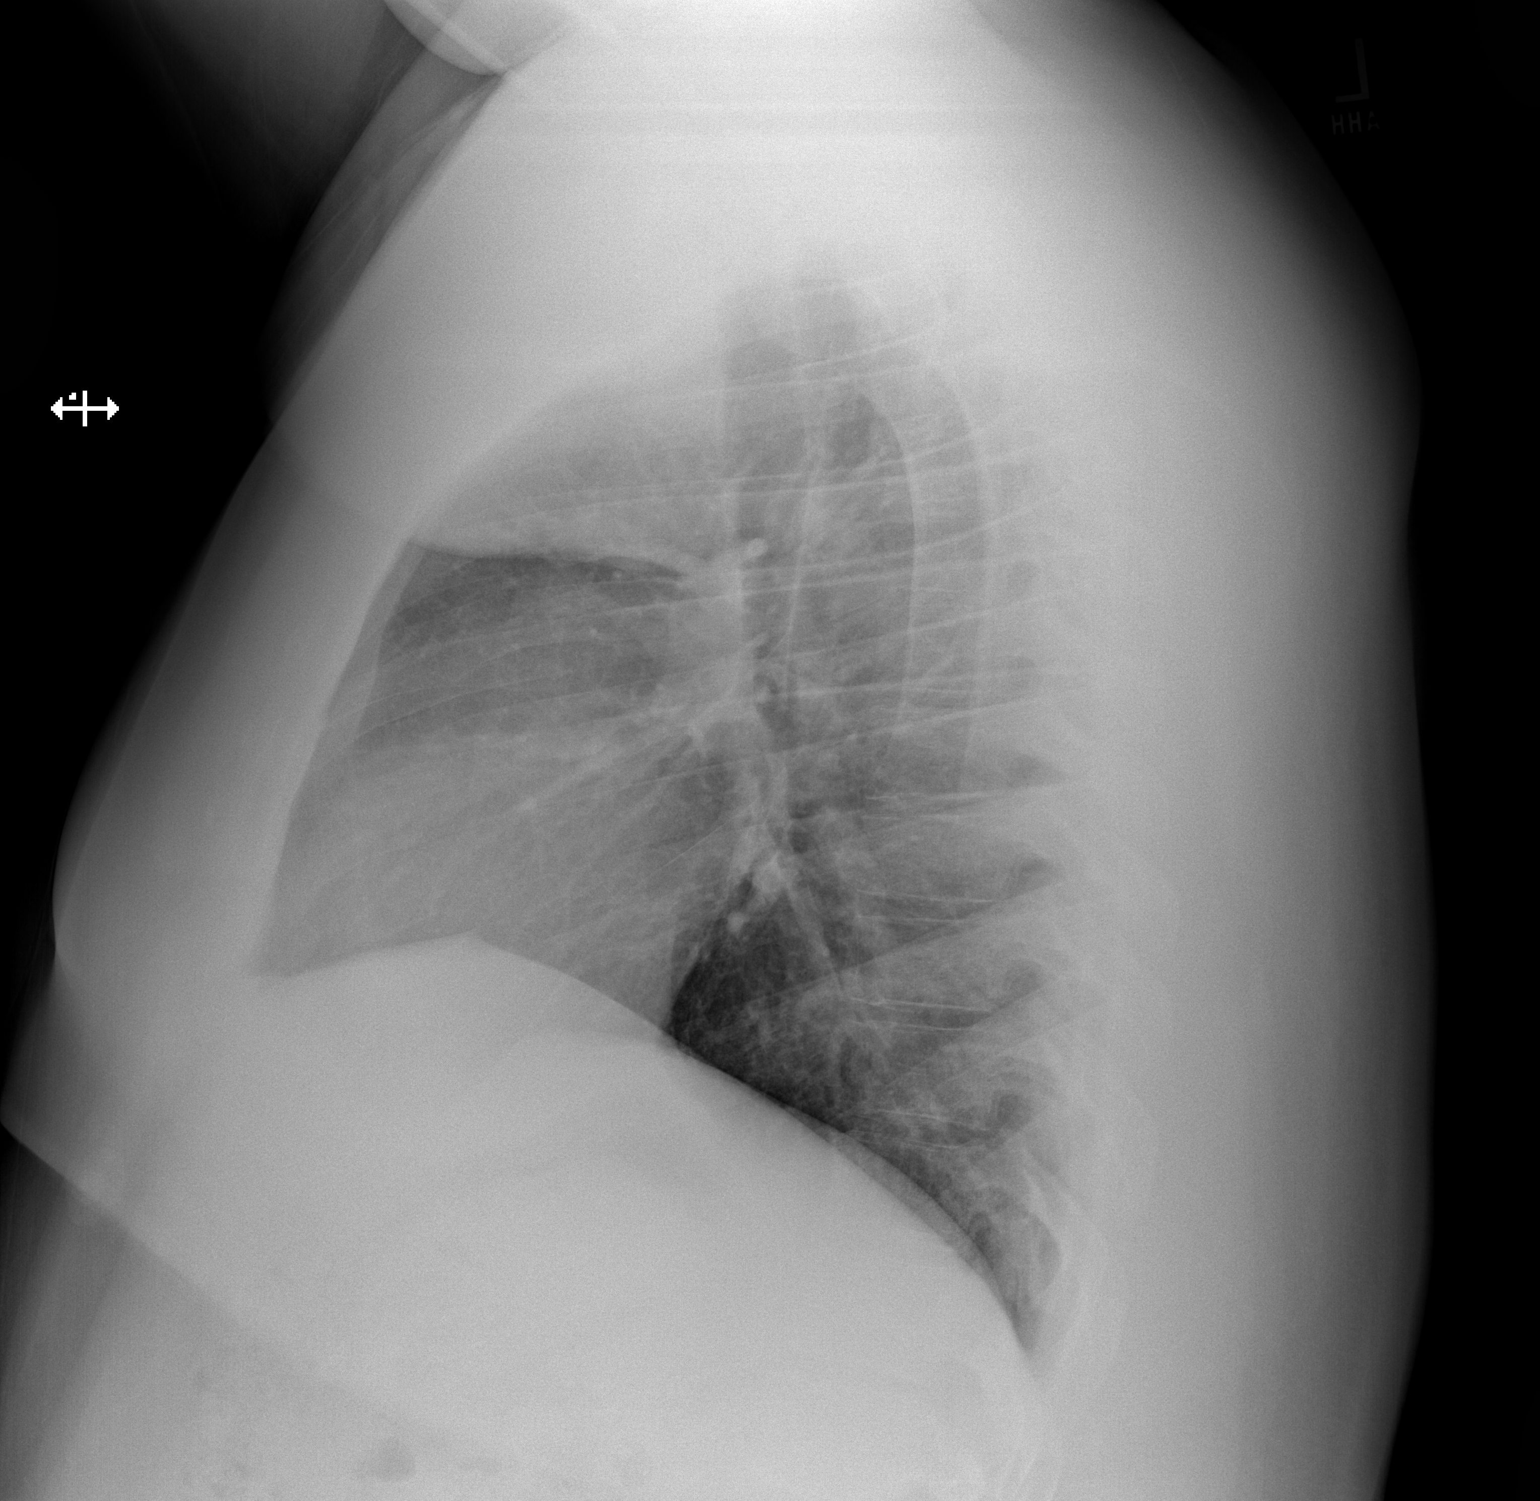

[2 of 2 positions shown; findings below may reference images not displayed]

FINDINGS: Lungs clear. Heart size normal. No pneumothorax or pleural fluid. No
bony abnormality.
IMPRESSION: Normal chest.

## 2018-09-20 ENCOUNTER — Encounter (HOSPITAL_COMMUNITY): Payer: Self-pay

## 2018-09-20 ENCOUNTER — Other Ambulatory Visit: Payer: Self-pay

## 2018-09-20 ENCOUNTER — Emergency Department (HOSPITAL_COMMUNITY): Payer: BLUE CROSS/BLUE SHIELD

## 2018-09-20 ENCOUNTER — Emergency Department (HOSPITAL_COMMUNITY)
Admission: EM | Admit: 2018-09-20 | Discharge: 2018-09-20 | Disposition: A | Discharge status: Home / Self Care | Payer: BLUE CROSS/BLUE SHIELD | Attending: Emergency Medicine | Admitting: Emergency Medicine

## 2018-09-20 DIAGNOSIS — R0602 Shortness of breath: Secondary | ICD-10-CM | POA: Diagnosis not present

## 2018-09-20 DIAGNOSIS — M545 Low back pain, unspecified: Secondary | ICD-10-CM

## 2018-09-20 DIAGNOSIS — I10 Essential (primary) hypertension: Secondary | ICD-10-CM

## 2018-09-20 DIAGNOSIS — R2243 Localized swelling, mass and lump, lower limb, bilateral: Secondary | ICD-10-CM | POA: Diagnosis not present

## 2018-09-20 DIAGNOSIS — Z7982 Long term (current) use of aspirin: Secondary | ICD-10-CM | POA: Insufficient documentation

## 2018-09-20 DIAGNOSIS — E119 Type 2 diabetes mellitus without complications: Secondary | ICD-10-CM | POA: Diagnosis not present

## 2018-09-20 DIAGNOSIS — Z794 Long term (current) use of insulin: Secondary | ICD-10-CM | POA: Insufficient documentation

## 2018-09-20 DIAGNOSIS — Z79899 Other long term (current) drug therapy: Secondary | ICD-10-CM | POA: Insufficient documentation

## 2018-09-20 DIAGNOSIS — E7800 Pure hypercholesterolemia, unspecified: Secondary | ICD-10-CM | POA: Diagnosis not present

## 2018-09-20 LAB — CBC WITH DIFFERENTIAL/PLATELET
Abs Immature Granulocytes: 0.08 10*3/uL — ABNORMAL HIGH (ref 0.00–0.07)
Basophils Absolute: 0 10*3/uL (ref 0.0–0.1)
Basophils Relative: 0 %
Eosinophils Absolute: 0.2 10*3/uL (ref 0.0–0.5)
Eosinophils Relative: 2 %
HCT: 37.7 % — ABNORMAL LOW (ref 39.0–52.0)
Hemoglobin: 12.6 g/dL — ABNORMAL LOW (ref 13.0–17.0)
Immature Granulocytes: 1 %
Lymphocytes Relative: 30 %
Lymphs Abs: 2.7 10*3/uL (ref 0.7–4.0)
MCH: 28.3 pg (ref 26.0–34.0)
MCHC: 33.4 g/dL (ref 30.0–36.0)
MCV: 84.5 fL (ref 80.0–100.0)
Monocytes Absolute: 0.6 10*3/uL (ref 0.1–1.0)
Monocytes Relative: 7 %
Neutro Abs: 5.4 10*3/uL (ref 1.7–7.7)
Neutrophils Relative %: 60 %
Platelets: 192 10*3/uL (ref 150–400)
RBC: 4.46 MIL/uL (ref 4.22–5.81)
RDW: 12.5 % (ref 11.5–15.5)
WBC: 9 10*3/uL (ref 4.0–10.5)
nRBC: 0 % (ref 0.0–0.2)

## 2018-09-20 LAB — COMPREHENSIVE METABOLIC PANEL
ALT: 26 U/L (ref 0–44)
AST: 26 U/L (ref 15–41)
Albumin: 4 g/dL (ref 3.5–5.0)
Alkaline Phosphatase: 93 U/L (ref 38–126)
Anion gap: 16 — ABNORMAL HIGH (ref 5–15)
BUN: 23 mg/dL — ABNORMAL HIGH (ref 6–20)
CO2: 24 mmol/L (ref 22–32)
Calcium: 9.6 mg/dL (ref 8.9–10.3)
Chloride: 101 mmol/L (ref 98–111)
Creatinine, Ser: 1.31 mg/dL — ABNORMAL HIGH (ref 0.61–1.24)
GFR calc Af Amer: >60 mL/min (ref >60)
GFR calc non Af Amer: >60 mL/min (ref >60)
Glucose, Bld: 184 mg/dL — ABNORMAL HIGH (ref 70–99)
Potassium: 3.6 mmol/L (ref 3.5–5.1)
Sodium: 141 mmol/L (ref 135–145)
Total Bilirubin: 1 mg/dL (ref 0.3–1.2)
Total Protein: 7.1 g/dL (ref 6.5–8.1)

## 2018-09-20 LAB — CK: Total CK: 422 U/L — ABNORMAL HIGH (ref 49–397)

## 2018-09-20 LAB — BRAIN NATRIURETIC PEPTIDE: B Natriuretic Peptide: 25 pg/mL (ref 0.0–100.0)

## 2018-09-20 LAB — TROPONIN I: Troponin I: <0.03 ng/mL (ref <0.03)

## 2018-09-20 MED ORDER — LISINOPRIL 20 MG PO TABS: 20.0000 mg | ORAL_TABLET | Freq: Every day | ORAL | 2 refills | Status: DC

## 2018-09-20 NOTE — ED Notes (Signed)
ED Provider at bedside. 

## 2018-09-20 NOTE — ED Provider Notes (Signed)
[facility] EMERGENCY DEPARTMENT Provider Note   CSN: [id number] Arrival date & time: 09/20/18  1129    History   Chief Complaint Chief Complaint  Patient presents with   Leg Swelling   Back Pain    HPI [name] is a 33 y.o. male.     Patient is a 34 year old African-American male who is morbidly obese with past medical history of insulin-dependent type 2 diabetes, hypertension, hypercholesterolemia who presents emergency department for increased lower extremity edema and shortness of breath in the last few days.  Reports that he was recently admitted to the hospital for AK I and thinks that his back pain might be related to his kidneys.  Reports that he is taking his medication as prescribed but that his diabetes is not very controlled.  He has not had any chest pain, cough, fever.  Reports that the shortness of breath is not only exertional.  Reports that sometimes he is just talking and he starts to feel short of breath.  Reports that he has worsening edema in both of his lower extremities with pain in his legs.  Denies any recent travel, sick contacts.     Past Medical History:  Diagnosis Date   Diabetes mellitus without complication ([place])    Hypercholesteremia    Hypertension     Patient Active Problem List   Diagnosis Date Noted   Gastroenteritis 07/20/2018   AKI (acute kidney injury) ([place]) 07/19/2018   Chest pain 07/19/2018   Hyperlipidemia 01/14/2015   Obesity (BMI 30-39.9) 01/14/2015   Insulin-requiring or dependent type II diabetes mellitus ([place]) 01/14/2015   Dyslipidemia 08/31/2014   HTN (hypertension) 08/31/2014   Diabetes ([place]) 08/15/2014    History reviewed. No pertinent surgical history.      Home Medications    Prior to Admission medications   Medication Sig Start Date End Date Taking? Authorizing Provider  ACCU-CHEK GUIDE test strip 1 each by Other route 3 (three) times daily.  01/03/18   [provider]  aspirin 81 MG tablet Take 1 tablet (81 mg total) by mouth daily. 08/25/17   Tobie Poet, DO  atorvastatin (LIPITOR) 10 MG tablet Take 1 tablet (10 mg total) by mouth daily. 08/13/17   Couture, Cortni S, PA-C  LANTUS SOLOSTAR 100 UNIT/ML Solostar Pen Inject 45-50 Units into the skin at bedtime.  08/09/14   [provider]  lisinopril (PRINIVIL,ZESTRIL) 20 MG tablet Take 1 tablet (20 mg total) by mouth daily. 09/20/18   Madilyn Hook A, PA-C  NOVOLOG FLEXPEN 100 UNIT/ML FlexPen Inject 5-10 Units into the skin See admin instructions. Inject 5-10 units into the skin three times a day before meals, per sliding scale 02/24/18   [provider]  omeprazole (PRILOSEC) 20 MG capsule Take 1 capsule (20 mg total) by mouth daily. 07/20/18   Debbe Odea, MD  PREVIDENT 5000 PLUS 1.1 % CREA dental cream Place 1 application onto teeth at bedtime.  03/08/18   [provider]    Family History Family History  Problem Relation Age of Onset   Diabetes Father     Social History Social History   Tobacco Use   Smoking status: Never Smoker   Smokeless tobacco: Never Used  Substance Use Topics   Alcohol use: No   Drug use: No     Allergies   Patient has no known allergies.   Review of Systems Review of Systems  Constitutional: Negative for chills and fever.  HENT: Negative for ear  pain and sore throat.   Eyes: Negative for pain and visual disturbance.  Respiratory: Positive for shortness of breath. Negative for cough.   Cardiovascular: Positive for leg swelling. Negative for chest pain and palpitations.  Gastrointestinal: Negative for abdominal pain and vomiting.  Endocrine: Negative for polyphagia and polyuria.  Genitourinary: Positive for decreased urine volume. Negative for difficulty urinating, dysuria and hematuria.  Musculoskeletal: Positive for back pain. Negative for arthralgias.  Skin: Negative for color change and rash.  Neurological: Negative for dizziness,  seizures and syncope.  All other systems reviewed and are negative.    Physical Exam Updated Vital Signs BP (!) 146/90    Pulse 87    Temp 98.3 F (36.8 C) (Oral)    Resp 14    Ht 6\' 4"  (1.93 m)    Wt (!) 142.4 kg    SpO2 98%    BMI 38.22 kg/m   Physical Exam Vitals signs and nursing note reviewed.  Constitutional:      General: He is not in acute distress.    Appearance: He is well-developed. He is obese. He is not ill-appearing, toxic-appearing or diaphoretic.  HENT:     Head: Normocephalic and atraumatic.     Nose: Nose normal.     Mouth/Throat:     Mouth: Mucous membranes are moist.  Eyes:     Conjunctiva/sclera: Conjunctivae normal.  Neck:     Musculoskeletal: Neck supple.  Cardiovascular:     Rate and Rhythm: Normal rate and regular rhythm.     Heart sounds: No murmur.  Pulmonary:     Effort: Pulmonary effort is normal. No respiratory distress.     Breath sounds: Normal breath sounds.  Abdominal:     Palpations: Abdomen is soft.     Tenderness: There is no abdominal tenderness.  Musculoskeletal:     Right lower leg: Edema present.     Left lower leg: Edema present.  Skin:    General: Skin is warm and dry.     Capillary Refill: Capillary refill takes less than 2 seconds.  Neurological:     General: No focal deficit present.     Mental Status: He is alert.  Psychiatric:        Mood and Affect: Mood normal.      ED Treatments / Results  Labs (all labs ordered are listed, but only abnormal results are displayed) Labs Reviewed  CBC WITH DIFFERENTIAL/PLATELET - Abnormal; Notable for the following components:      Result Value   Hemoglobin 12.6 (*)    HCT 37.7 (*)    Abs Immature Granulocytes 0.08 (*)    All other components within normal limits  COMPREHENSIVE METABOLIC PANEL - Abnormal; Notable for the following components:   Glucose, Bld 184 (*)    BUN 23 (*)    Creatinine, Ser 1.31 (*)    Anion gap 16 (*)    All other components within normal limits    CK - Abnormal; Notable for the following components:   Total CK 422 (*)    All other components within normal limits  BRAIN NATRIURETIC PEPTIDE  TROPONIN I  URINALYSIS, ROUTINE W REFLEX MICROSCOPIC    EKG EKG Interpretation  Date/Time:  Wednesday September 20 2018 11:38:48 EDT Ventricular Rate:  100 PR Interval:    QRS Duration: 89 QT Interval:  355 QTC Calculation: 458 R Axis:   89 Text Interpretation:  Sinus tachycardia Baseline wander in lead(s) V4 V5 V6 No significant change since last tracing  Confirmed by Wandra Arthurs ([id number]) on 09/20/2018 11:44:01 AM   Radiology Dg Chest Port 1 View  Result Date: 09/20/2018 CLINICAL DATA:  Short of breath for a week. Bilateral leg swelling. Back pain. EXAM: PORTABLE CHEST 1 VIEW COMPARISON:  07/19/2018 FINDINGS: The heart size and mediastinal contours are within normal limits. Both lungs are clear. No pleural effusion or pneumothorax. The visualized skeletal structures are unremarkable. IMPRESSION: No active disease. Electronically Signed   By: Lajean Manes M.D.   On: 09/20/2018 12:25    Procedures Procedures (including critical care time)  Medications Ordered in ED Medications - No data to display   Initial Impression / Assessment and Plan / ED Course  I have reviewed the triage vital signs and the nursing notes.  Pertinent labs & imaging results that were available during my care of the patient were reviewed by me and considered in my medical decision making (see chart for details).  Clinical Course as of Sep 19 1421  Wed Sep 20, 2018  1421 Discussed patient with Dr. Darl Householder.  Patient's labs are improved from admission 2 months ago.  Patient continues to be hypertensive.  He reports that he is taking 10 mg of lisinopril.  I think that we should increase this to 20 mg of lisinopril.  He will follow-up with primary care doctor.  Reports that he is unable to get into a primary care doctor until July due to the covert outbreak.  He understands to  return to the emergency department as soon as possible for for any new or worsening symptoms   [KM]    Clinical Course User Index [KM] Alveria Apley, PA-C       Based on review of vitals, medical screening exam, lab work and/or imaging, there does not appear to be an acute, emergent etiology for the patient's symptoms. Counseled pt on good return precautions and encouraged both PCP and ED follow-up as needed.  Prior to discharge, I also discussed incidental imaging findings with patient in detail and advised appropriate, recommended follow-up in detail.  Clinical Impression: 1. Hypertension, unspecified type   2. Acute bilateral low back pain without sciatica     Disposition: Discharge  Prior to providing a prescription for a controlled substance, I independently reviewed the patient's recent prescription history on the [place]. The patient had no recent or regular prescriptions and was deemed appropriate for a brief, less than 3 day prescription of narcotic for acute analgesia.  This note was prepared with assistance of Systems analyst. Occasional wrong-word or sound-a-like substitutions may have occurred due to the inherent limitations of voice recognition software.   Final Clinical Impressions(s) / ED Diagnoses   Final diagnoses:  Hypertension, unspecified type  Acute bilateral low back pain without sciatica    ED Discharge Orders         Ordered    lisinopril (PRINIVIL,ZESTRIL) 20 MG tablet  Daily     09/20/18 1423           Kristine Royal 09/20/18 1423    Drenda Freeze, MD 09/21/18 1006

## 2018-09-20 NOTE — ED Notes (Signed)
Patient verbalizes understanding of discharge instructions. Opportunity for questioning and answers were provided. Armband removed by staff, pt discharged from ED. Prescriptions and pharmacy reviewed. Pt ambulatory to lobby.

## 2018-09-20 NOTE — ED Notes (Signed)
Pt informed he needs to provide UA. Pt has urinal at bedside but states he does not have to go at this time. States he will use call light.

## 2018-09-20 NOTE — ED Triage Notes (Addendum)
Pt arrives POV for bilateral leg swelling, abdominal swelling, intermittent left lower back burning and urinary issues. Pt was admitted 2-3 months ago for kidney labs and given IVF. Pt reports had some of this swelling when he was admitted Pt denies hx of CHF. Pt reports only peeing once a day, denies burning or stinging with urination. Pt reports intermittent back burning currently 4/10. Pt alert, oriented, and ambulatory. Pt denies travel, denies fever, denies being around sick persons.

## 2018-12-01 DIAGNOSIS — E782 Mixed hyperlipidemia: Secondary | ICD-10-CM | POA: Diagnosis not present

## 2018-12-01 DIAGNOSIS — I10 Essential (primary) hypertension: Secondary | ICD-10-CM | POA: Diagnosis not present

## 2018-12-01 DIAGNOSIS — Z794 Long term (current) use of insulin: Secondary | ICD-10-CM | POA: Diagnosis not present

## 2018-12-01 DIAGNOSIS — E1169 Type 2 diabetes mellitus with other specified complication: Secondary | ICD-10-CM | POA: Diagnosis not present

## 2019-06-27 ENCOUNTER — Ambulatory Visit: Payer: 59 | Attending: Internal Medicine

## 2019-06-27 DIAGNOSIS — Z20822 Contact with and (suspected) exposure to covid-19: Secondary | ICD-10-CM

## 2019-06-28 LAB — NOVEL CORONAVIRUS, NAA: SARS-CoV-2, NAA: NOT DETECTED

## 2019-08-01 ENCOUNTER — Emergency Department (HOSPITAL_COMMUNITY): Payer: 59

## 2019-08-01 ENCOUNTER — Other Ambulatory Visit: Payer: Self-pay

## 2019-08-01 ENCOUNTER — Emergency Department (HOSPITAL_COMMUNITY)
Admission: EM | Admit: 2019-08-01 | Discharge: 2019-08-01 | Disposition: A | Discharge status: Home / Self Care | Payer: 59 | Attending: Emergency Medicine | Admitting: Emergency Medicine

## 2019-08-01 ENCOUNTER — Encounter (HOSPITAL_COMMUNITY): Payer: Self-pay

## 2019-08-01 DIAGNOSIS — I10 Essential (primary) hypertension: Secondary | ICD-10-CM | POA: Insufficient documentation

## 2019-08-01 DIAGNOSIS — R072 Precordial pain: Secondary | ICD-10-CM

## 2019-08-01 DIAGNOSIS — E669 Obesity, unspecified: Secondary | ICD-10-CM | POA: Diagnosis not present

## 2019-08-01 DIAGNOSIS — E119 Type 2 diabetes mellitus without complications: Secondary | ICD-10-CM | POA: Diagnosis not present

## 2019-08-01 DIAGNOSIS — Z6835 Body mass index (BMI) 35.0-35.9, adult: Secondary | ICD-10-CM | POA: Diagnosis not present

## 2019-08-01 DIAGNOSIS — Z79899 Other long term (current) drug therapy: Secondary | ICD-10-CM | POA: Insufficient documentation

## 2019-08-01 DIAGNOSIS — Z7982 Long term (current) use of aspirin: Secondary | ICD-10-CM | POA: Insufficient documentation

## 2019-08-01 LAB — CBC
HCT: 40.1 % (ref 39.0–52.0)
Hemoglobin: 13.6 g/dL (ref 13.0–17.0)
MCH: 28.8 pg (ref 26.0–34.0)
MCHC: 33.9 g/dL (ref 30.0–36.0)
MCV: 84.8 fL (ref 80.0–100.0)
Platelets: 237 10*3/uL (ref 150–400)
RBC: 4.73 MIL/uL (ref 4.22–5.81)
RDW: 12.6 % (ref 11.5–15.5)
WBC: 8.5 10*3/uL (ref 4.0–10.5)
nRBC: 0 % (ref 0.0–0.2)

## 2019-08-01 LAB — BASIC METABOLIC PANEL
Anion gap: 9 (ref 5–15)
BUN: 20 mg/dL (ref 6–20)
CO2: 28 mmol/L (ref 22–32)
Calcium: 9.8 mg/dL (ref 8.9–10.3)
Chloride: 107 mmol/L (ref 98–111)
Creatinine, Ser: 1.19 mg/dL (ref 0.61–1.24)
GFR calc Af Amer: >60 mL/min (ref >60)
GFR calc non Af Amer: >60 mL/min (ref >60)
Glucose, Bld: 98 mg/dL (ref 70–99)
Potassium: 3.6 mmol/L (ref 3.5–5.1)
Sodium: 144 mmol/L (ref 135–145)

## 2019-08-01 LAB — TROPONIN I (HIGH SENSITIVITY)
Troponin I (High Sensitivity): <2 ng/L (ref <18)
Troponin I (High Sensitivity): <2 ng/L (ref <18)

## 2019-08-01 MED ORDER — ALUM & MAG HYDROXIDE-SIMETH 200-200-20 MG/5ML PO SUSP
30.0000 mL | Freq: Once | ORAL | Status: AC
  Administered 2019-08-01: 30 mL via ORAL
  Filled 2019-08-01: qty 30

## 2019-08-01 MED ORDER — SODIUM CHLORIDE 0.9% FLUSH: 3.0000 mL | Freq: Once | INTRAVENOUS | Status: DC

## 2019-08-01 MED ORDER — LIDOCAINE VISCOUS HCL 2 % MT SOLN
15.0000 mL | Freq: Once | OROMUCOSAL | Status: AC
  Administered 2019-08-01: 15 mL via ORAL
  Filled 2019-08-01: qty 15

## 2019-08-01 NOTE — ED Notes (Signed)
Patient verbalizes understanding of discharge instructions. Opportunity for questioning and answers were provided. Armband removed by staff, pt discharged from ED. Pt. ambulatory and discharged home.  

## 2019-08-01 NOTE — Discharge Instructions (Signed)

## 2019-08-01 NOTE — ED Triage Notes (Signed)
Pt presents w/mid-sternum chest pain radiating to Left arm and SOB  x1.5 weeks.

## 2019-08-01 NOTE — ED Provider Notes (Signed)
[facility] EMERGENCY DEPARTMENT Provider Note  CSN: [id number] Arrival date & time: 08/01/19 1614  Chief Complaint(s) Chest Pain  HPI [name] is a 34 y.o. male    Chest Pain Pain location:  Substernal area Pain quality: pressure   Pain radiates to:  L shoulder Pain severity:  Moderate Onset quality:  Gradual Duration:  1 week Timing:  Constant Progression:  Waxing and waning Chronicity:  Recurrent Relieved by:  Nothing Worsened by:  Nothing Associated symptoms: heartburn   Associated symptoms: no anxiety, no back pain, no cough, no fever, no headache, no nausea, no shortness of breath and no vomiting   Risk factors: diabetes mellitus, high cholesterol, hypertension, male sex and obesity   Risk factors: no smoking     Past Medical History Past Medical History:  Diagnosis Date  . Diabetes mellitus without complication ([place])   . Hypercholesteremia   . Hypertension    Patient Active Problem List   Diagnosis Date Noted  . Gastroenteritis 07/20/2018  . AKI (acute kidney injury) ([place]) 07/19/2018  . Chest pain 07/19/2018  . Hyperlipidemia 01/14/2015  . Obesity (BMI 30-39.9) 01/14/2015  . Insulin-requiring or dependent type II diabetes mellitus ([place]) 01/14/2015  . Dyslipidemia 08/31/2014  . HTN (hypertension) 08/31/2014  . Diabetes ([place]) 08/15/2014   Home Medication(s) Prior to Admission medications   Medication Sig Start Date End Date Taking? Authorizing Provider  ACCU-CHEK GUIDE test strip 1 each by Other route 3 (three) times daily.  01/03/18   [provider]  aspirin 81 MG tablet Take 1 tablet (81 mg total) by mouth daily. 08/25/17   Tobie Poet, DO  atorvastatin (LIPITOR) 10 MG tablet Take 1 tablet (10 mg total) by mouth daily. 08/13/17   Couture, Cortni S, PA-C  LANTUS SOLOSTAR 100 UNIT/ML Solostar Pen Inject 45-50 Units into the skin at bedtime.  08/09/14   [provider]  lisinopril (PRINIVIL,ZESTRIL) 20 MG tablet Take 1  tablet (20 mg total) by mouth daily. 09/20/18   Madilyn Hook A, PA-C  NOVOLOG FLEXPEN 100 UNIT/ML FlexPen Inject 5-10 Units into the skin See admin instructions. Inject 5-10 units into the skin three times a day before meals, per sliding scale 02/24/18   [provider]  omeprazole (PRILOSEC) 20 MG capsule Take 1 capsule (20 mg total) by mouth daily. 07/20/18   Debbe Odea, MD  PREVIDENT 5000 PLUS 1.1 % CREA dental cream Place 1 application onto teeth at bedtime.  03/08/18   [provider]                                                                                                                                    Past Surgical History History reviewed. No pertinent surgical history. Family History Family History  Problem Relation Age of Onset  . Diabetes Father     Social History Social History   Tobacco Use  . Smoking status: Never  Smoker  . Smokeless tobacco: Never Used  Substance Use Topics  . Alcohol use: No  . Drug use: No   Allergies Patient has no known allergies.  Review of Systems Review of Systems  Constitutional: Negative for fever.  Respiratory: Negative for cough and shortness of breath.   Cardiovascular: Positive for chest pain.  Gastrointestinal: Positive for heartburn. Negative for nausea and vomiting.  Musculoskeletal: Negative for back pain.  Neurological: Negative for headaches.   All other systems are reviewed and are negative for acute change except as noted in the HPI  Physical Exam Vital Signs  I have reviewed the triage vital signs BP (!) 144/83   Pulse 82   Temp 98.7 F (37.1 C) (Oral)   Resp 15   Ht 6\' 4"  (1.93 m)   Wt 133.8 kg   SpO2 100%   BMI 35.91 kg/m   Physical Exam Vitals reviewed.  Constitutional:      General: He is not in acute distress.    Appearance: He is well-developed. He is obese. He is not diaphoretic.  HENT:     Head: Normocephalic and atraumatic.     Nose: Nose normal.  Eyes:     General: No  scleral icterus.       Right eye: No discharge.        Left eye: No discharge.     Conjunctiva/sclera: Conjunctivae normal.     Pupils: Pupils are equal, round, and reactive to light.  Cardiovascular:     Rate and Rhythm: Normal rate and regular rhythm.     Heart sounds: No murmur. No friction rub. No gallop.   Pulmonary:     Effort: Pulmonary effort is normal. No respiratory distress.     Breath sounds: Normal breath sounds. No stridor. No rales.  Abdominal:     General: There is no distension.     Palpations: Abdomen is soft.     Tenderness: There is no abdominal tenderness.  Musculoskeletal:        General: No tenderness.     Cervical back: Normal range of motion and neck supple.  Skin:    General: Skin is warm and dry.     Findings: No erythema or rash.  Neurological:     Mental Status: He is alert and oriented to person, place, and time.     ED Results and Treatments Labs (all labs ordered are listed, but only abnormal results are displayed) Labs Reviewed  BASIC METABOLIC PANEL  CBC  TROPONIN I (HIGH SENSITIVITY)  TROPONIN I (HIGH SENSITIVITY)                                                                                                                         EKG  EKG Interpretation  Date/Time:  Wednesday August 01 2019 16:19:58 EST Ventricular Rate:  97 PR Interval:  152 QRS Duration: 88 QT Interval:  356 QTC Calculation: 452 R Axis:   103 Text Interpretation: Normal sinus rhythm Rightward  axis Cannot rule out Anterior infarct , age undetermined Abnormal ECG No significant change since 4/20 Reconfirmed by Addison Lank [phone]) on 08/01/2019 11:03:55 PM      Radiology DG Chest 2 View  Result Date: 08/01/2019 CLINICAL DATA:  Chest pain, shortness of breath EXAM: CHEST - 2 VIEW COMPARISON:  09/20/2018 FINDINGS: The heart size and mediastinal contours are within normal limits. Both lungs are clear. The visualized skeletal structures are unremarkable.  IMPRESSION: Normal chest radiographs. Electronically Signed   By: Davina Poke D.O.   On: 08/01/2019 17:04    Pertinent labs & imaging results that were available during my care of the patient were reviewed by me and considered in my medical decision making (see chart for details).  Medications Ordered in ED Medications  sodium chloride flush (NS) 0.9 % injection 3 mL (has no administration in time range)  alum & mag hydroxide-simeth (MAALOX/MYLANTA) 200-200-20 MG/5ML suspension 30 mL (30 mLs Oral Given 08/01/19 2336)    And  lidocaine (XYLOCAINE) 2 % viscous mouth solution 15 mL (15 mLs Oral Given 08/01/19 2336)                                                                                                                                    Procedures Procedures  (including critical care time)  Medical Decision Making / ED Course I have reviewed the nursing notes for this encounter and the patient's prior records (if available in EHR or on provided paperwork).   [name] was evaluated in Emergency Department on 08/01/2019 for the symptoms described in the history of present illness. He was evaluated in the context of the global COVID-19 pandemic, which necessitated consideration that the patient might be at risk for infection with the SARS-CoV-2 virus that causes COVID-19. Institutional protocols and algorithms that pertain to the evaluation of patients at risk for COVID-19 are in a state of rapid change based on information released by regulatory bodies including the CDC and federal and state organizations. These policies and algorithms were followed during the patient's care in the ED.  Substernal chest pain. EKG without acute ischemic changes or evidence of pericarditis.  Serial troponin negative x2.  Patient heart score is 3. Given the duration of his symptoms, his current work-up is sufficient to rule out ACS.  Presentation is not classic for aortic dissection or esophageal  perforation. Low suspicion for pulmonary embolism.  Chest x-ray without evidence suggestive of pneumonia, pneumothorax, pneumomediastinum.  No abnormal contour of the mediastinum to suggest dissection. No evidence of acute injuries.  Have suspicion for GI related issues.  Provided with GI cocktail here      Final Clinical Impression(s) / ED Diagnoses Final diagnoses:  Precordial chest pain    The patient appears reasonably screened and/or stabilized for discharge and I doubt any other medical condition or other [facility] requiring further screening, evaluation, or treatment in the ED at this time  prior to discharge. Safe for discharge with strict return precautions.  Disposition: Discharge  Condition: Good  I have discussed the results, Dx and Tx plan with the patient/family who expressed understanding and agree(s) with the plan. Discharge instructions discussed at length. The patient/family was given strict return precautions who verbalized understanding of the instructions. No further questions at time of discharge.    ED Discharge Orders    None       Follow Up: [name], [place] [address] [phone]  Schedule an appointment as soon as possible for a visit  For close follow up to set up stress testing    This chart was dictated using voice recognition software.  Despite best efforts to proofread,  errors can occur which can change the documentation meaning.   Fatima Blank, MD 08/01/19 2337

## 2020-01-13 DIAGNOSIS — I219 Acute myocardial infarction, unspecified: Secondary | ICD-10-CM

## 2020-01-13 HISTORY — DX: Acute myocardial infarction, unspecified: I21.9

## 2020-01-28 ENCOUNTER — Encounter (HOSPITAL_COMMUNITY)
Admission: EM | Disposition: A | Discharge status: Home / Self Care | Payer: Self-pay | Source: Home / Self Care | Attending: Interventional Cardiology

## 2020-01-28 ENCOUNTER — Encounter (HOSPITAL_COMMUNITY): Payer: Self-pay

## 2020-01-28 ENCOUNTER — Inpatient Hospital Stay (HOSPITAL_COMMUNITY)
Admission: EM | Admit: 2020-01-28 | Discharge: 2020-02-01 | DRG: 246 | Disposition: A | Discharge status: Home / Self Care | Payer: 59 | Attending: Interventional Cardiology | Admitting: Interventional Cardiology

## 2020-01-28 ENCOUNTER — Inpatient Hospital Stay (HOSPITAL_COMMUNITY): Payer: 59

## 2020-01-28 ENCOUNTER — Other Ambulatory Visit: Payer: Self-pay

## 2020-01-28 DIAGNOSIS — E876 Hypokalemia: Secondary | ICD-10-CM | POA: Diagnosis not present

## 2020-01-28 DIAGNOSIS — E782 Mixed hyperlipidemia: Secondary | ICD-10-CM | POA: Diagnosis not present

## 2020-01-28 DIAGNOSIS — D72829 Elevated white blood cell count, unspecified: Secondary | ICD-10-CM | POA: Diagnosis not present

## 2020-01-28 DIAGNOSIS — N179 Acute kidney failure, unspecified: Secondary | ICD-10-CM | POA: Diagnosis present

## 2020-01-28 DIAGNOSIS — Y92239 Unspecified place in hospital as the place of occurrence of the external cause: Secondary | ICD-10-CM | POA: Diagnosis not present

## 2020-01-28 DIAGNOSIS — I10 Essential (primary) hypertension: Secondary | ICD-10-CM | POA: Diagnosis present

## 2020-01-28 DIAGNOSIS — Z955 Presence of coronary angioplasty implant and graft: Secondary | ICD-10-CM

## 2020-01-28 DIAGNOSIS — I493 Ventricular premature depolarization: Secondary | ICD-10-CM | POA: Diagnosis present

## 2020-01-28 DIAGNOSIS — E119 Type 2 diabetes mellitus without complications: Secondary | ICD-10-CM

## 2020-01-28 DIAGNOSIS — E669 Obesity, unspecified: Secondary | ICD-10-CM | POA: Diagnosis present

## 2020-01-28 DIAGNOSIS — N182 Chronic kidney disease, stage 2 (mild): Secondary | ICD-10-CM | POA: Diagnosis present

## 2020-01-28 DIAGNOSIS — Z794 Long term (current) use of insulin: Secondary | ICD-10-CM

## 2020-01-28 DIAGNOSIS — E871 Hypo-osmolality and hyponatremia: Secondary | ICD-10-CM | POA: Diagnosis not present

## 2020-01-28 DIAGNOSIS — Z8249 Family history of ischemic heart disease and other diseases of the circulatory system: Secondary | ICD-10-CM | POA: Diagnosis not present

## 2020-01-28 DIAGNOSIS — R55 Syncope and collapse: Secondary | ICD-10-CM | POA: Diagnosis not present

## 2020-01-28 DIAGNOSIS — I5021 Acute systolic (congestive) heart failure: Secondary | ICD-10-CM | POA: Diagnosis present

## 2020-01-28 DIAGNOSIS — H539 Unspecified visual disturbance: Secondary | ICD-10-CM | POA: Diagnosis present

## 2020-01-28 DIAGNOSIS — I2102 ST elevation (STEMI) myocardial infarction involving left anterior descending coronary artery: Secondary | ICD-10-CM

## 2020-01-28 DIAGNOSIS — Z7982 Long term (current) use of aspirin: Secondary | ICD-10-CM | POA: Diagnosis not present

## 2020-01-28 DIAGNOSIS — E785 Hyperlipidemia, unspecified: Secondary | ICD-10-CM | POA: Diagnosis present

## 2020-01-28 DIAGNOSIS — I2109 ST elevation (STEMI) myocardial infarction involving other coronary artery of anterior wall: Principal | ICD-10-CM | POA: Diagnosis present

## 2020-01-28 DIAGNOSIS — N141 Nephropathy induced by other drugs, medicaments and biological substances: Secondary | ICD-10-CM | POA: Diagnosis not present

## 2020-01-28 DIAGNOSIS — I130 Hypertensive heart and chronic kidney disease with heart failure and stage 1 through stage 4 chronic kidney disease, or unspecified chronic kidney disease: Secondary | ICD-10-CM | POA: Diagnosis present

## 2020-01-28 DIAGNOSIS — T508X5A Adverse effect of diagnostic agents, initial encounter: Secondary | ICD-10-CM | POA: Diagnosis not present

## 2020-01-28 DIAGNOSIS — E7800 Pure hypercholesterolemia, unspecified: Secondary | ICD-10-CM | POA: Diagnosis present

## 2020-01-28 DIAGNOSIS — I2510 Atherosclerotic heart disease of native coronary artery without angina pectoris: Secondary | ICD-10-CM | POA: Diagnosis not present

## 2020-01-28 DIAGNOSIS — Z20822 Contact with and (suspected) exposure to covid-19: Secondary | ICD-10-CM | POA: Diagnosis present

## 2020-01-28 DIAGNOSIS — E1165 Type 2 diabetes mellitus with hyperglycemia: Secondary | ICD-10-CM | POA: Diagnosis present

## 2020-01-28 DIAGNOSIS — Z79899 Other long term (current) drug therapy: Secondary | ICD-10-CM | POA: Diagnosis not present

## 2020-01-28 DIAGNOSIS — D631 Anemia in chronic kidney disease: Secondary | ICD-10-CM | POA: Diagnosis present

## 2020-01-28 DIAGNOSIS — I313 Pericardial effusion (noninflammatory): Secondary | ICD-10-CM | POA: Diagnosis present

## 2020-01-28 DIAGNOSIS — I249 Acute ischemic heart disease, unspecified: Secondary | ICD-10-CM

## 2020-01-28 DIAGNOSIS — I959 Hypotension, unspecified: Secondary | ICD-10-CM | POA: Diagnosis not present

## 2020-01-28 DIAGNOSIS — R0602 Shortness of breath: Secondary | ICD-10-CM

## 2020-01-28 DIAGNOSIS — Z833 Family history of diabetes mellitus: Secondary | ICD-10-CM

## 2020-01-28 DIAGNOSIS — Z6838 Body mass index (BMI) 38.0-38.9, adult: Secondary | ICD-10-CM

## 2020-01-28 DIAGNOSIS — E1122 Type 2 diabetes mellitus with diabetic chronic kidney disease: Secondary | ICD-10-CM | POA: Diagnosis present

## 2020-01-28 HISTORY — PX: CORONARY STENT INTERVENTION: CATH118234

## 2020-01-28 HISTORY — PX: CORONARY/GRAFT ACUTE MI REVASCULARIZATION: CATH118305

## 2020-01-28 HISTORY — PX: LEFT HEART CATH AND CORONARY ANGIOGRAPHY: CATH118249

## 2020-01-28 HISTORY — DX: Atherosclerotic heart disease of native coronary artery without angina pectoris: I25.10

## 2020-01-28 LAB — CBC WITH DIFFERENTIAL/PLATELET
Abs Immature Granulocytes: 0.07 10*3/uL (ref 0.00–0.07)
Basophils Absolute: 0 10*3/uL (ref 0.0–0.1)
Basophils Relative: 0 %
Eosinophils Absolute: 0 10*3/uL (ref 0.0–0.5)
Eosinophils Relative: 0 %
HCT: 36.5 % — ABNORMAL LOW (ref 39.0–52.0)
Hemoglobin: 12 g/dL — ABNORMAL LOW (ref 13.0–17.0)
Immature Granulocytes: 1 %
Lymphocytes Relative: 11 %
Lymphs Abs: 1.6 10*3/uL (ref 0.7–4.0)
MCH: 28.4 pg (ref 26.0–34.0)
MCHC: 32.9 g/dL (ref 30.0–36.0)
MCV: 86.3 fL (ref 80.0–100.0)
Monocytes Absolute: 1.5 10*3/uL — ABNORMAL HIGH (ref 0.1–1.0)
Monocytes Relative: 10 %
Neutro Abs: 11.6 10*3/uL — ABNORMAL HIGH (ref 1.7–7.7)
Neutrophils Relative %: 78 %
Platelets: 248 10*3/uL (ref 150–400)
RBC: 4.23 MIL/uL (ref 4.22–5.81)
RDW: 12.9 % (ref 11.5–15.5)
WBC: 14.8 10*3/uL — ABNORMAL HIGH (ref 4.0–10.5)
nRBC: 0 % (ref 0.0–0.2)

## 2020-01-28 LAB — POCT I-STAT, CHEM 8
BUN: 21 mg/dL — ABNORMAL HIGH (ref 6–20)
Calcium, Ion: 1.22 mmol/L (ref 1.15–1.40)
Chloride: 102 mmol/L (ref 98–111)
Creatinine, Ser: 2 mg/dL — ABNORMAL HIGH (ref 0.61–1.24)
Glucose, Bld: 206 mg/dL — ABNORMAL HIGH (ref 70–99)
HCT: 33 % — ABNORMAL LOW (ref 39.0–52.0)
Hemoglobin: 11.2 g/dL — ABNORMAL LOW (ref 13.0–17.0)
Potassium: 3.6 mmol/L (ref 3.5–5.1)
Sodium: 140 mmol/L (ref 135–145)
TCO2: 23 mmol/L (ref 22–32)

## 2020-01-28 LAB — COMPREHENSIVE METABOLIC PANEL
ALT: 45 U/L — ABNORMAL HIGH (ref 0–44)
AST: 113 U/L — ABNORMAL HIGH (ref 15–41)
Albumin: 2.7 g/dL — ABNORMAL LOW (ref 3.5–5.0)
Alkaline Phosphatase: 93 U/L (ref 38–126)
Anion gap: 11 (ref 5–15)
BUN: 19 mg/dL (ref 6–20)
CO2: 24 mmol/L (ref 22–32)
Calcium: 9 mg/dL (ref 8.9–10.3)
Chloride: 103 mmol/L (ref 98–111)
Creatinine, Ser: 2.28 mg/dL — ABNORMAL HIGH (ref 0.61–1.24)
GFR calc Af Amer: 41 mL/min — ABNORMAL LOW (ref >60)
GFR calc non Af Amer: 36 mL/min — ABNORMAL LOW (ref >60)
Glucose, Bld: 217 mg/dL — ABNORMAL HIGH (ref 70–99)
Potassium: 3.6 mmol/L (ref 3.5–5.1)
Sodium: 138 mmol/L (ref 135–145)
Total Bilirubin: 1.7 mg/dL — ABNORMAL HIGH (ref 0.3–1.2)
Total Protein: 6.6 g/dL (ref 6.5–8.1)

## 2020-01-28 LAB — GLUCOSE, CAPILLARY
Glucose-Capillary: 190 mg/dL — ABNORMAL HIGH (ref 70–99)
Glucose-Capillary: 206 mg/dL — ABNORMAL HIGH (ref 70–99)
Glucose-Capillary: 296 mg/dL — ABNORMAL HIGH (ref 70–99)

## 2020-01-28 LAB — LIPID PANEL
Cholesterol: 308 mg/dL — ABNORMAL HIGH (ref 0–200)
HDL: 49 mg/dL (ref >40)
LDL Cholesterol: UNDETERMINED mg/dL (ref 0–99)
Total CHOL/HDL Ratio: 6.3 RATIO
Triglycerides: 445 mg/dL — ABNORMAL HIGH (ref <150)
VLDL: UNDETERMINED mg/dL (ref 0–40)

## 2020-01-28 LAB — PROTIME-INR
INR: 1.1 (ref 0.8–1.2)
Prothrombin Time: 14.2 seconds (ref 11.4–15.2)

## 2020-01-28 LAB — HEMOGLOBIN A1C
Hgb A1c MFr Bld: 10.1 % — ABNORMAL HIGH (ref 4.8–5.6)
Mean Plasma Glucose: 243.17 mg/dL

## 2020-01-28 LAB — TROPONIN I (HIGH SENSITIVITY)
Troponin I (High Sensitivity): >27000 ng/L (ref <18)
Troponin I (High Sensitivity): >27000 ng/L (ref <18)

## 2020-01-28 LAB — APTT: aPTT: 30 seconds (ref 24–36)

## 2020-01-28 LAB — MRSA PCR SCREENING: MRSA by PCR: NEGATIVE

## 2020-01-28 LAB — LDL CHOLESTEROL, DIRECT: Direct LDL: 118.2 mg/dL — ABNORMAL HIGH (ref 0–99)

## 2020-01-28 LAB — SARS CORONAVIRUS 2 BY RT PCR (HOSPITAL ORDER, PERFORMED IN ~~LOC~~ HOSPITAL LAB): SARS Coronavirus 2: NEGATIVE

## 2020-01-28 LAB — POCT ACTIVATED CLOTTING TIME: Activated Clotting Time: 318 seconds

## 2020-01-28 SURGERY — LEFT HEART CATH AND CORONARY ANGIOGRAPHY
Anesthesia: LOCAL

## 2020-01-28 MED ORDER — ACETAMINOPHEN 325 MG PO TABS
650.0000 mg | ORAL_TABLET | ORAL | Status: DC | PRN
  Administered 2020-01-30: 650 mg via ORAL
  Filled 2020-01-28: qty 2

## 2020-01-28 MED ORDER — SODIUM FLUORIDE 1.1 % DT CREA: 1.0000 "application " | TOPICAL_CREAM | Freq: Every day | DENTAL | Status: DC

## 2020-01-28 MED ORDER — VERAPAMIL HCL 2.5 MG/ML IV SOLN
INTRAVENOUS | Status: DC | PRN
  Administered 2020-01-28: 10 mL via INTRA_ARTERIAL

## 2020-01-28 MED ORDER — HYDRALAZINE HCL 20 MG/ML IJ SOLN
10.0000 mg | INTRAMUSCULAR | Status: AC | PRN
  Administered 2020-01-28: 10 mg via INTRAVENOUS
  Filled 2020-01-28: qty 1

## 2020-01-28 MED ORDER — INSULIN GLARGINE 100 UNIT/ML ~~LOC~~ SOLN
50.0000 [IU] | Freq: Every day | SUBCUTANEOUS | Status: DC
  Administered 2020-01-28 – 2020-01-29 (×2): 50 [IU] via SUBCUTANEOUS
  Filled 2020-01-28 (×4): qty 0.5

## 2020-01-28 MED ORDER — LABETALOL HCL 5 MG/ML IV SOLN
10.0000 mg | INTRAVENOUS | Status: AC | PRN
  Administered 2020-01-28 (×4): 10 mg via INTRAVENOUS
  Filled 2020-01-28 (×3): qty 4

## 2020-01-28 MED ORDER — ATORVASTATIN CALCIUM 80 MG PO TABS
80.0000 mg | ORAL_TABLET | Freq: Every day | ORAL | Status: DC
  Administered 2020-01-28 – 2020-02-01 (×5): 80 mg via ORAL
  Filled 2020-01-28 (×5): qty 1

## 2020-01-28 MED ORDER — METOPROLOL TARTRATE 12.5 MG HALF TABLET
12.5000 mg | ORAL_TABLET | Freq: Two times a day (BID) | ORAL | Status: DC
  Administered 2020-01-28: 12.5 mg via ORAL
  Filled 2020-01-28: qty 1

## 2020-01-28 MED ORDER — PANTOPRAZOLE SODIUM 40 MG PO TBEC
40.0000 mg | DELAYED_RELEASE_TABLET | Freq: Every day | ORAL | Status: DC
  Administered 2020-01-29 – 2020-02-01 (×4): 40 mg via ORAL
  Filled 2020-01-28 (×4): qty 1

## 2020-01-28 MED ORDER — NITROGLYCERIN 1 MG/10 ML FOR IR/CATH LAB
INTRA_ARTERIAL | Status: AC
  Filled 2020-01-28: qty 10

## 2020-01-28 MED ORDER — HEPARIN (PORCINE) IN NACL 1000-0.9 UT/500ML-% IV SOLN
INTRAVENOUS | Status: AC
  Filled 2020-01-28: qty 1000

## 2020-01-28 MED ORDER — ONDANSETRON HCL 4 MG/2ML IJ SOLN
4.0000 mg | Freq: Four times a day (QID) | INTRAMUSCULAR | Status: DC | PRN
  Administered 2020-01-28: 4 mg via INTRAVENOUS
  Filled 2020-01-28: qty 2

## 2020-01-28 MED ORDER — METOPROLOL TARTRATE 25 MG PO TABS
25.0000 mg | ORAL_TABLET | Freq: Two times a day (BID) | ORAL | Status: DC
  Administered 2020-01-28: 25 mg via ORAL
  Filled 2020-01-28: qty 1

## 2020-01-28 MED ORDER — HEPARIN SODIUM (PORCINE) 5000 UNIT/ML IJ SOLN: 4000.0000 [IU] | Freq: Once | INTRAMUSCULAR | Status: DC

## 2020-01-28 MED ORDER — ASPIRIN 81 MG PO TABS: 81.0000 mg | ORAL_TABLET | Freq: Every day | ORAL | Status: DC

## 2020-01-28 MED ORDER — MIDAZOLAM HCL 2 MG/2ML IJ SOLN
INTRAMUSCULAR | Status: DC | PRN
  Administered 2020-01-28: 1 mg via INTRAVENOUS

## 2020-01-28 MED ORDER — VERAPAMIL HCL 2.5 MG/ML IV SOLN
INTRAVENOUS | Status: AC
  Filled 2020-01-28: qty 2

## 2020-01-28 MED ORDER — HEPARIN (PORCINE) IN NACL 1000-0.9 UT/500ML-% IV SOLN
INTRAVENOUS | Status: DC | PRN
  Administered 2020-01-28 (×2): 500 mL

## 2020-01-28 MED ORDER — TIROFIBAN (AGGRASTAT) BOLUS VIA INFUSION
INTRAVENOUS | Status: DC | PRN
  Administered 2020-01-28: 3402.5 ug via INTRAVENOUS

## 2020-01-28 MED ORDER — MIDAZOLAM HCL 2 MG/2ML IJ SOLN
INTRAMUSCULAR | Status: AC
  Filled 2020-01-28: qty 2

## 2020-01-28 MED ORDER — HEPARIN SODIUM (PORCINE) 1000 UNIT/ML IJ SOLN
INTRAMUSCULAR | Status: AC
  Filled 2020-01-28: qty 1

## 2020-01-28 MED ORDER — IOHEXOL 350 MG/ML SOLN
INTRAVENOUS | Status: AC
  Filled 2020-01-28: qty 1

## 2020-01-28 MED ORDER — ONDANSETRON HCL 4 MG/2ML IJ SOLN
INTRAMUSCULAR | Status: AC
  Filled 2020-01-28: qty 2

## 2020-01-28 MED ORDER — TICAGRELOR 90 MG PO TABS
90.0000 mg | ORAL_TABLET | Freq: Two times a day (BID) | ORAL | Status: DC
  Administered 2020-01-28 – 2020-01-29 (×3): 90 mg via ORAL
  Filled 2020-01-28 (×3): qty 1

## 2020-01-28 MED ORDER — TIROFIBAN HCL IN NACL 5-0.9 MG/100ML-% IV SOLN
INTRAVENOUS | Status: AC
  Filled 2020-01-28: qty 100

## 2020-01-28 MED ORDER — INSULIN GLARGINE 100 UNIT/ML ~~LOC~~ SOLN
45.0000 [IU] | Freq: Every day | SUBCUTANEOUS | Status: DC
  Filled 2020-01-28: qty 0.45

## 2020-01-28 MED ORDER — NITROGLYCERIN 1 MG/10 ML FOR IR/CATH LAB
INTRA_ARTERIAL | Status: DC | PRN
  Administered 2020-01-28: 200 ug via INTRACORONARY

## 2020-01-28 MED ORDER — IOHEXOL 350 MG/ML SOLN
INTRAVENOUS | Status: DC | PRN
  Administered 2020-01-28: 110 mL

## 2020-01-28 MED ORDER — LABETALOL HCL 5 MG/ML IV SOLN
INTRAVENOUS | Status: AC
  Filled 2020-01-28: qty 4

## 2020-01-28 MED ORDER — FENTANYL CITRATE (PF) 100 MCG/2ML IJ SOLN
INTRAMUSCULAR | Status: AC
  Filled 2020-01-28: qty 2

## 2020-01-28 MED ORDER — TICAGRELOR 90 MG PO TABS
ORAL_TABLET | ORAL | Status: AC
  Filled 2020-01-28: qty 1

## 2020-01-28 MED ORDER — SODIUM CHLORIDE 0.9 % IV SOLN: INTRAVENOUS | Status: AC

## 2020-01-28 MED ORDER — ASPIRIN 81 MG PO CHEW
324.0000 mg | CHEWABLE_TABLET | Freq: Once | ORAL | Status: AC
  Administered 2020-01-28: 324 mg via ORAL
  Filled 2020-01-28: qty 4

## 2020-01-28 MED ORDER — ATORVASTATIN CALCIUM 10 MG PO TABS: 10.0000 mg | ORAL_TABLET | Freq: Every day | ORAL | Status: DC

## 2020-01-28 MED ORDER — MORPHINE SULFATE (PF) 2 MG/ML IV SOLN
1.0000 mg | INTRAVENOUS | Status: DC | PRN
  Administered 2020-01-28 (×2): 2 mg via INTRAVENOUS
  Filled 2020-01-28: qty 1

## 2020-01-28 MED ORDER — SODIUM CHLORIDE 0.9 % IV SOLN: 250.0000 mL | INTRAVENOUS | Status: DC | PRN

## 2020-01-28 MED ORDER — HEPARIN SODIUM (PORCINE) 1000 UNIT/ML IJ SOLN
INTRAMUSCULAR | Status: DC | PRN
  Administered 2020-01-28: 15000 [IU] via INTRAVENOUS

## 2020-01-28 MED ORDER — LIDOCAINE HCL (PF) 1 % IJ SOLN
INTRAMUSCULAR | Status: AC
  Filled 2020-01-28: qty 30

## 2020-01-28 MED ORDER — ASPIRIN 81 MG PO CHEW
81.0000 mg | CHEWABLE_TABLET | Freq: Every day | ORAL | Status: DC
  Administered 2020-01-29 – 2020-02-01 (×4): 81 mg via ORAL
  Filled 2020-01-28 (×4): qty 1

## 2020-01-28 MED ORDER — SODIUM CHLORIDE 0.9% FLUSH: 3.0000 mL | INTRAVENOUS | Status: DC | PRN

## 2020-01-28 MED ORDER — SODIUM CHLORIDE 0.9% FLUSH
3.0000 mL | Freq: Two times a day (BID) | INTRAVENOUS | Status: DC
  Administered 2020-01-28: 8 mL via INTRAVENOUS
  Administered 2020-01-29: 10 mL via INTRAVENOUS
  Administered 2020-01-30 – 2020-02-01 (×4): 3 mL via INTRAVENOUS

## 2020-01-28 MED ORDER — FENTANYL CITRATE (PF) 100 MCG/2ML IJ SOLN
INTRAMUSCULAR | Status: DC | PRN
  Administered 2020-01-28: 25 ug via INTRAVENOUS

## 2020-01-28 MED ORDER — TIROFIBAN HCL IN NACL 5-0.9 MG/100ML-% IV SOLN
INTRAVENOUS | Status: DC | PRN
  Administered 2020-01-28 (×2): 0.15 ug/kg/min via INTRAVENOUS

## 2020-01-28 MED ORDER — CHLORHEXIDINE GLUCONATE CLOTH 2 % EX PADS
6.0000 | MEDICATED_PAD | Freq: Every day | CUTANEOUS | Status: DC
  Administered 2020-01-28: 6 via TOPICAL

## 2020-01-28 MED ORDER — HEPARIN SODIUM (PORCINE) 5000 UNIT/ML IJ SOLN
INTRAMUSCULAR | Status: AC
  Filled 2020-01-28: qty 1

## 2020-01-28 MED ORDER — TICAGRELOR 90 MG PO TABS
ORAL_TABLET | ORAL | Status: DC | PRN
  Administered 2020-01-28: 180 mg via ORAL

## 2020-01-28 MED ORDER — LIDOCAINE HCL (PF) 1 % IJ SOLN
INTRAMUSCULAR | Status: DC | PRN
  Administered 2020-01-28: 2 mL

## 2020-01-28 MED ORDER — INSULIN ASPART 100 UNIT/ML ~~LOC~~ SOLN
0.0000 [IU] | Freq: Three times a day (TID) | SUBCUTANEOUS | Status: DC
  Administered 2020-01-28: 3 [IU] via SUBCUTANEOUS
  Administered 2020-01-29: 9 [IU] via SUBCUTANEOUS

## 2020-01-28 MED ORDER — MORPHINE SULFATE (PF) 2 MG/ML IV SOLN
INTRAVENOUS | Status: AC
  Filled 2020-01-28: qty 1

## 2020-01-28 SURGICAL SUPPLY — 22 items
BALLN SAPPHIRE 2.0X12 (BALLOONS) ×2
BALLN SAPPHIRE 2.5X12 (BALLOONS) ×2
BALLN SAPPHIRE ~~LOC~~ 3.0X15 (BALLOONS) ×2 IMPLANT
BALLOON SAPPHIRE 2.0X12 (BALLOONS) ×1 IMPLANT
BALLOON SAPPHIRE 2.5X12 (BALLOONS) ×1 IMPLANT
CATH EXTRAC PRONTO 5.5F 138CM (CATHETERS) ×2 IMPLANT
CATH INFINITI JR4 5F (CATHETERS) ×2 IMPLANT
CATH LAUNCHER 6FR EBU3.5 (CATHETERS) ×2 IMPLANT
DEVICE RAD COMP TR BAND LRG (VASCULAR PRODUCTS) ×2 IMPLANT
GLIDESHEATH SLEND SS 6F .021 (SHEATH) ×2 IMPLANT
GUIDEWIRE INQWIRE 1.5J.035X260 (WIRE) ×1 IMPLANT
INQWIRE 1.5J .035X260CM (WIRE) ×2
KIT ENCORE 26 ADVANTAGE (KITS) ×2 IMPLANT
KIT HEART LEFT (KITS) ×2 IMPLANT
KIT HEMO VALVE WATCHDOG (MISCELLANEOUS) ×2 IMPLANT
PACK CARDIAC CATHETERIZATION (CUSTOM PROCEDURE TRAY) ×2 IMPLANT
STENT SYNERGY XD 2.50X24 (Permanent Stent) ×1 IMPLANT
SYNERGY XD 2.50X24 (Permanent Stent) ×2 IMPLANT
TRANSDUCER W/STOPCOCK (MISCELLANEOUS) ×2 IMPLANT
TUBING CIL FLEX 10 FLL-RA (TUBING) ×2 IMPLANT
WIRE ASAHI PROWATER 180CM (WIRE) ×2 IMPLANT
WIRE FIGHTER CROSSING 190CM (WIRE) ×2 IMPLANT

## 2020-01-28 NOTE — Progress Notes (Signed)
Patient with c/o chest discomfort and shortness of breath.   2 mg of Morphine given.   PA paged regarding patient patient's condition. Chest Xray ordered and PA to come to bedside.  RN will continue to monitor and follow MD/PA orders.

## 2020-01-28 NOTE — Progress Notes (Signed)
C/O nausea and feeling hot. Warm and dry. Zofran given

## 2020-01-28 NOTE — H&P (Signed)
Cardiology Admission History and Physical:   Patient ID: [name] MRN: [id number]; DOB: 02-Jun-1986   Admission date: 01/28/2020  Primary Care Provider: [name], [facility] Cardiologist: No primary care provider on file. new [place] Electrophysiologist:  None   Chief Complaint:  Chest pain  Patient Profile:   [name] is a 34 y.o. male with DM, family h/o CAD  History of Present Illness:   [name] has multiple risk factors for heart disease.  He began having chest discomfort yesterday afternoon.  He also began feeling hot and sweaty.  In speaking to his wife, she explained that she and the patient's father encouraged him to go to the hospital, but he resisted.  His discomfort waxed and waned somewhat.  It was fairly constant.  He tried taking a shower to see if that would help cool him off.  Ultimately, he came to the hospital this morning.  Initial ECG revealed normal sinus rhythm with anterior Q waves and marked ST elevation.  This was a significant change from the prior ECG.  Upon arrival to the Cath Lab, he was still having 7 out of 10 discomfort.  He had been hemodynamically stable in the emergency room.   Past Medical History:  Diagnosis Date  . Diabetes mellitus without complication ([place])   . Hypercholesteremia   . Hypertension     History reviewed. No pertinent surgical history.   Medications Prior to Admission: Prior to Admission medications   Medication Sig Start Date End Date Taking? Authorizing Provider  ACCU-CHEK GUIDE test strip 1 each by Other route 3 (three) times daily.  01/03/18   [provider]  aspirin 81 MG tablet Take 1 tablet (81 mg total) by mouth daily. 08/25/17   Tobie Poet, DO  atorvastatin (LIPITOR) 10 MG tablet Take 1 tablet (10 mg total) by mouth daily. 08/13/17   Couture, Cortni S, PA-C  LANTUS SOLOSTAR 100 UNIT/ML Solostar Pen Inject 45-50 Units into the skin at bedtime.  08/09/14   [provider]    lisinopril (PRINIVIL,ZESTRIL) 20 MG tablet Take 1 tablet (20 mg total) by mouth daily. 09/20/18   Madilyn Hook A, PA-C  NOVOLOG FLEXPEN 100 UNIT/ML FlexPen Inject 5-10 Units into the skin See admin instructions. Inject 5-10 units into the skin three times a day before meals, per sliding scale 02/24/18   [provider]  omeprazole (PRILOSEC) 20 MG capsule Take 1 capsule (20 mg total) by mouth daily. 07/20/18   Debbe Odea, MD  PREVIDENT 5000 PLUS 1.1 % CREA dental cream Place 1 application onto teeth at bedtime.  03/08/18   [provider]     Allergies:   No Known Allergies  Social History:   Social History   Socioeconomic History  . Marital status: Married    Spouse name: Not on file  . Number of children: Not on file  . Years of education: Not on file  . Highest education level: Not on file  Occupational History  . Not on file  Tobacco Use  . Smoking status: Never Smoker  . Smokeless tobacco: Never Used  Vaping Use  . Vaping Use: Never used  Substance and Sexual Activity  . Alcohol use: No  . Drug use: No  . Sexual activity: Not on file  Other Topics Concern  . Not on file  Social History Narrative  . Not on file   Social Determinants of Health   Financial Resource Strain:   . Difficulty  of Paying Living Expenses:   Food Insecurity:   . Worried About Charity fundraiser in the Last Year:   . Arboriculturist in the Last Year:   Transportation Needs:   . Film/video editor (Medical):   Marland Kitchen Lack of Transportation (Non-Medical):   Physical Activity:   . Days of Exercise per Week:   . Minutes of Exercise per Session:   Stress:   . Feeling of Stress :   Social Connections:   . Frequency of Communication with Friends and Family:   . Frequency of Social Gatherings with Friends and Family:   . Attends Religious Services:   . Active Member of Clubs or Organizations:   . Attends Archivist Meetings:   Marland Kitchen Marital Status:   Intimate Partner  Violence:   . Fear of Current or Ex-Partner:   . Emotionally Abused:   Marland Kitchen Physically Abused:   . Sexually Abused:     Family History: Heart problems in his father as well The patient's family history includes Diabetes in his father.    ROS:  Please see the history of present illness.  Chest discomfort, nausea, sweating all other ROS reviewed and negative.     Physical Exam/Data:   Vitals:   01/28/20 1505 01/28/20 1510 01/28/20 1525 01/28/20 1540  BP: 128/81 (!) 116/95 129/85 134/89  Pulse: 95 94 97 97  Resp: (!) 21 19 13 19   Temp:      TempSrc:      SpO2: 98% 99% 99% 98%  Weight:      Height:       No intake or output data in the 24 hours ending 01/28/20 1544 Last 3 Weights 01/28/2020 01/28/2020 08/01/2019  Weight (lbs) 300 lb 300 lb 295 lb  Weight (kg) 136.079 kg 136.079 kg 133.811 kg     Body mass index is 36.52 kg/m.  General:  Well nourished, well developed, in no acute distress HEENT: normal Lymph: no adenopathy Neck: no JVD Endocrine:  No thryomegaly Vascular: No carotid bruits; FA pulses 2+ bilaterally without bruits  Cardiac:  normal S1, S2; RRR Lungs:  clear to auscultation bilaterally, no wheezing, rhonchi or rales  Abd: soft, nontender, no hepatomegaly obese Ext: noedema Musculoskeletal:  No deformities, BUE and BLE strength normal and equal; 2+ right radial pulse Skin: warm and dry  Neuro:  CNs 2-12 intact, no focal abnormalities noted Psych:  Normal affect    EKG:  The ECG that was done today was personally reviewed and demonstrates sinus tachycardia with anterior Q waves and ST elevation in the anterior leads  Relevant CV Studies: Prior ECG reviewed which showed significant difference in QRS and ST wave morphologies  Laboratory Data:  High Sensitivity Troponin:   Recent Labs  Lab 01/28/20 1242  TROPONINIHS >27,000*      Chemistry Recent Labs  Lab 01/28/20 1242  NA 138  K 3.6  CL 103  CO2 24  GLUCOSE 217*  BUN 19  CREATININE 2.28*    CALCIUM 9.0  GFRNONAA 36*  GFRAA 41*  ANIONGAP 11    Recent Labs  Lab 01/28/20 1242  PROT 6.6  ALBUMIN 2.7*  AST 113*  ALT 45*  ALKPHOS 93  BILITOT 1.7*   Hematology Recent Labs  Lab 01/28/20 1242  WBC 14.8*  RBC 4.23  HGB 12.0*  HCT 36.5*  MCV 86.3  MCH 28.4  MCHC 32.9  RDW 12.9  PLT 248   BNPNo results for input(s): BNP, PROBNP in  the last 168 hours.  DDimer No results for input(s): DDIMER in the last 168 hours.   Radiology/Studies:  CARDIAC CATHETERIZATION  Result Date: 01/28/2020  Prox LAD lesion is 100% stenosed. Late presenting anterior MI, as his symptoms have been going on for about 24 hours.  A drug-eluting stent was successfully placed using a SYNERGY XD 2.50X24, postdilated to greater than 3 mm in diameter.  Post intervention, there is a 0% residual stenosis.  There is moderate to severe left ventricular systolic dysfunction with anterior wall hypokinesis.  LV end diastolic pressure is mildly elevated.  The left ventricular ejection fraction is 25-35% by visual estimate.  There is no aortic valve stenosis.  Watch for 48 hours.  Check echocardiogram.  May need to consider LifeVest. Holding lisinopril at this time since his creatinine was elevated in the Cath Lab at the time of procedure. 1 year of dual antiplatelet therapy recommended along with aggressive secondary prevention.        TIMI Risk Score for ST  Elevation MI:   The patient's TIMI risk score is 7, which indicates a 23.4% risk of all cause mortality at 30 days.       Assessment and Plan:   1. Acute anterior wall MI: I personally reviewed the ECG and made the decision for the patient to come to the Cath Lab.  Further plans will be decided based on cath results. 2. Hyperlipidemia: He will need atorvastatin increased to 80 mg daily.  LDL target 70. 3. Acute renal failure: Hold lisinopril given elevated creatinine.  Gentle hydration post procedure despite decreased ejection fraction due to  his apparent acute renal failure. 4. Diabetes is not well controlled.  A1c is 10.1.  Continue aggressive preventive therapy and treatment.  Long-term, he will need a healthy diet and regular exercise.  Cath showed occluded LAD.  This was successfully stented.  Continue aggressive secondary prevention.  He will need dual antiplatelet therapy as well.  Check echocardiogram.  May need a LifeVest due to late presentation.   Severity of Illness: The appropriate patient status for this patient is INPATIENT. Inpatient status is judged to be reasonable and necessary in order to provide the required intensity of service to ensure the patient's safety. The patient's presenting symptoms, physical exam findings, and initial radiographic and laboratory data in the context of their chronic comorbidities is felt to place them at high risk for further clinical deterioration. Furthermore, it is not anticipated that the patient will be medically stable for discharge from the hospital within 2 midnights of admission. The following factors support the patient status of inpatient.   " The patient's presenting symptoms include chest discomfort " The worrisome physical exam findings include diaphoresis. " The initial radiographic and laboratory data are worrisome because of abnormal ECG. " The chronic co-morbidities include diabetes, hypertension, hyperlipidemia, family history of heart disease.   * I certify that at the point of admission it is my clinical judgment that the patient will require inpatient hospital care spanning beyond 2 midnights from the point of admission due to high intensity of service, high risk for further deterioration and high frequency of surveillance required.*    For questions or updates, please contact [place] Please consult [email or web address] for contact info under     Signed, Larae Grooms, MD  01/28/2020 3:44 PM

## 2020-01-28 NOTE — Progress Notes (Signed)
   Called by RN around 6pm about patient reported some mild chest discomfort and shortness of breath as well as nausea. Reviewed cath report. Went to see patient. He was resting comfortably in no acute distress. O2 sats in the mid/high 90's on room air. No labored breathing. No crackles noted on exam. Patient reports transient shortness of breath that actually sound like it may be due to Brilinta. Ordered chest x-ray given reduced EF and mildly elevated LVEDP on cath. Did discuss with Dr. Irish Lack who recommended not giving Lasix unless patient significantly short of breath, hypoxic, or signs of pulmonary edema on chest x-ray.   Chest x-ray showed left base opacity which may represent atelectasis or pneumonia. Went back to check on patient before leaving around 9:15pm. He was sleeping when I walked in the room. He reports continued episodes of transient shortness of breath where he takes 3 quick breaths and then breathing returns to normal. I witnessed one of these episode. Does not seem consistent with pulmonary edema. Lungs remain clear on exam. Dicussed with Dr. Margaretann Loveless who agrees and recommends continuing monitoring overnight. He is still tachycardic with rates in the low 100's. Will increase to 25mg  twice daily.  Darreld Mclean, PA-C 01/28/2020 9:24 PM

## 2020-01-28 NOTE — Progress Notes (Signed)
Lantus dose clarified by patient as 50u nightly, will update order. Will also place order for SSI and order diabetes educator consult given poorly controlled DM. Quantavia Frith PA-C

## 2020-01-28 NOTE — ED Triage Notes (Signed)
Pt reports severe centralized chest pain that radiates to his shoulder blades that started yesterday. Hx of diabetes, CBG 485 this morning, pt takes insulin. Pt denies SOB.

## 2020-01-28 NOTE — ED Provider Notes (Signed)
[facility] EMERGENCY DEPARTMENT Provider Note   CSN: [id number] Arrival date & time: 01/28/20  1223     History Chief Complaint  Patient presents with   Chest Pain   Code STEMI    [name] is a 34 y.o. male.  HPI     Patient presents with sternal chest pain. Pain began about 24 hours prior to ED arrival.  The patient was at work, moving objects when he felt chest pain. Since that time pain has been severe, with waxing, waning severity, but persistent throughout. Patient has received aspirins prior to my evaluation, notes that his pain is somewhat better. There is no associated dyspnea, no syncope, no vomiting. Patient has notable history of diabetes, hypercholesterolemia, hypertension, and his father has had prior MI. Patient states, however, that he was generally well prior to yesterday, when the onset, which was sudden.  I was made aware of the patient's presents from our triage room, where EKG was done on arrival. He was designated, subsequently, as a code STEMI, and I discussed his case with 3 of our cardiology colleagues.  Past Medical History:  Diagnosis Date   Diabetes mellitus without complication ([place])    Hypercholesteremia    Hypertension     Patient Active Problem List   Diagnosis Date Noted   Gastroenteritis 07/20/2018   AKI (acute kidney injury) ([place]) 07/19/2018   Chest pain 07/19/2018   Hyperlipidemia 01/14/2015   Obesity (BMI 30-39.9) 01/14/2015   Insulin-requiring or dependent type II diabetes mellitus ([place]) 01/14/2015   Dyslipidemia 08/31/2014   HTN (hypertension) 08/31/2014   Diabetes ([place]) 08/15/2014    History reviewed. No pertinent surgical history.     Family History  Problem Relation Age of Onset   Diabetes Father     Social History   Tobacco Use   Smoking status: Never Smoker   Smokeless tobacco: Never Used  Vaping Use   Vaping Use: Never used  Substance Use Topics   Alcohol use: No     Drug use: No    Home Medications Prior to Admission medications   Medication Sig Start Date End Date Taking? Authorizing Provider  ACCU-CHEK GUIDE test strip 1 each by Other route 3 (three) times daily.  01/03/18   [provider]  aspirin 81 MG tablet Take 1 tablet (81 mg total) by mouth daily. 08/25/17   Tobie Poet, DO  atorvastatin (LIPITOR) 10 MG tablet Take 1 tablet (10 mg total) by mouth daily. 08/13/17   Couture, Cortni S, PA-C  LANTUS SOLOSTAR 100 UNIT/ML Solostar Pen Inject 45-50 Units into the skin at bedtime.  08/09/14   [provider]  lisinopril (PRINIVIL,ZESTRIL) 20 MG tablet Take 1 tablet (20 mg total) by mouth daily. 09/20/18   Madilyn Hook A, PA-C  NOVOLOG FLEXPEN 100 UNIT/ML FlexPen Inject 5-10 Units into the skin See admin instructions. Inject 5-10 units into the skin three times a day before meals, per sliding scale 02/24/18   [provider]  omeprazole (PRILOSEC) 20 MG capsule Take 1 capsule (20 mg total) by mouth daily. 07/20/18   Debbe Odea, MD  PREVIDENT 5000 PLUS 1.1 % CREA dental cream Place 1 application onto teeth at bedtime.  03/08/18   [provider]    Allergies    Patient has no known allergies.  Review of Systems   Review of Systems  Constitutional:       Per HPI, otherwise negative  HENT:       Per HPI,  otherwise negative  Respiratory:       Per HPI, otherwise negative  Cardiovascular:       Per HPI, otherwise negative  Gastrointestinal: Negative for vomiting.  Endocrine:       Negative aside from HPI  Genitourinary:       Neg aside from HPI   Musculoskeletal:       Per HPI, otherwise negative  Skin: Negative.   Neurological: Negative for syncope.    Physical Exam Updated Vital Signs BP 123/77 (BP Location: Right Arm)    Pulse (!) 107    Temp 99.6 F (37.6 C) (Oral)    Resp 18    Ht 6\' 4"  (1.93 m)    Wt 136.1 kg    SpO2 98%    BMI 36.52 kg/m   Physical Exam Vitals and nursing note reviewed.   Constitutional:      Appearance: He is well-developed. He is diaphoretic.  HENT:     Head: Normocephalic and atraumatic.  Eyes:     Conjunctiva/sclera: Conjunctivae normal.  Cardiovascular:     Rate and Rhythm: Normal rate and regular rhythm.  Pulmonary:     Effort: Tachypnea present. No respiratory distress.     Breath sounds: No stridor.  Abdominal:     General: There is no distension.  Skin:    General: Skin is warm.  Neurological:     Mental Status: He is alert and oriented to person, place, and time.     ED Results / Procedures / Treatments   Labs (all labs ordered are listed, but only abnormal results are displayed) Labs Reviewed  SARS CORONAVIRUS 2 BY RT PCR (HOSPITAL ORDER, [facility])  HEMOGLOBIN A1C  CBC WITH DIFFERENTIAL/PLATELET  PROTIME-INR  APTT  COMPREHENSIVE METABOLIC PANEL  LIPID PANEL  TROPONIN I (HIGH SENSITIVITY)    EKG EKG Interpretation  Date/Time:  Monday January 28 2020 12:26:45 EDT Ventricular Rate:  109 PR Interval:  146 QRS Duration: 94 QT Interval:  324 QTC Calculation: 436 R Axis:   124 Text Interpretation: Critical Test Result: STEMI Sinus tachycardia Right axis deviation Anteroseptal infarct , possibly acute Inferolateral injury pattern  ACUTE MI / STEMI  Abnormal ECG Confirmed by Carmin Muskrat [phone]) on 01/28/2020 12:35:39 PM   Radiology No results found.  Procedures Procedures (including critical care time)  Medications Ordered in ED Medications  heparin injection 4,000 Units (has no administration in time range)  heparin 5000 UNIT/ML injection (has no administration in time range)  aspirin chewable tablet 324 mg (324 mg Oral Given 01/28/20 1240)    ED Course  I have reviewed the triage vital signs and the nursing notes.  Pertinent labs & imaging results that were available during my care of the patient were reviewed by me and considered in my medical decision making (see chart for  details).     Immediately after the initial evaluation with consideration of ongoing coronary ischemia given his persistent chest pain, abnormal EKG, I compared it to prior studies, notable for changes in multiple leads with elevations, and depression in lead III.  Additionally, Q waves anteriorly suggest possible recent infarct. I discussed his case with our cardiology colleagues, patient received heparin, and will require emergent intervention.  12:57 PM Patient departing for catheterization lab. Final Clinical Impression(s) / ED Diagnoses Final diagnoses:  ACS (acute coronary syndrome) ([place])   CRITICAL CARE Performed by: Carmin Muskrat Total critical care time: 35 minutes Critical care time was exclusive of separately billable  procedures and treating other patients. Critical care was necessary to treat or prevent imminent or life-threatening deterioration. Critical care was time spent personally by me on the following activities: development of treatment plan with patient and/or surrogate as well as nursing, discussions with consultants, evaluation of patient's response to treatment, examination of patient, obtaining history from patient or surrogate, ordering and performing treatments and interventions, ordering and review of laboratory studies, ordering and review of radiographic studies, pulse oximetry and re-evaluation of patient's condition.    Carmin Muskrat, MD 01/28/20 1300

## 2020-01-28 NOTE — Progress Notes (Signed)
Continues to feel nauseated. Dr. Irish Lack made aware.

## 2020-01-28 NOTE — ED Notes (Signed)
ACTIVATED CODE STEMI

## 2020-01-29 ENCOUNTER — Inpatient Hospital Stay (HOSPITAL_COMMUNITY): Payer: 59

## 2020-01-29 ENCOUNTER — Encounter (HOSPITAL_COMMUNITY): Payer: Self-pay | Admitting: Interventional Cardiology

## 2020-01-29 DIAGNOSIS — I2109 ST elevation (STEMI) myocardial infarction involving other coronary artery of anterior wall: Secondary | ICD-10-CM

## 2020-01-29 LAB — CBC
HCT: 31.8 % — ABNORMAL LOW (ref 39.0–52.0)
Hemoglobin: 10.5 g/dL — ABNORMAL LOW (ref 13.0–17.0)
MCH: 28.8 pg (ref 26.0–34.0)
MCHC: 33 g/dL (ref 30.0–36.0)
MCV: 87.1 fL (ref 80.0–100.0)
Platelets: 214 10*3/uL (ref 150–400)
RBC: 3.65 MIL/uL — ABNORMAL LOW (ref 4.22–5.81)
RDW: 13.1 % (ref 11.5–15.5)
WBC: 17.5 10*3/uL — ABNORMAL HIGH (ref 4.0–10.5)
nRBC: 0 % (ref 0.0–0.2)

## 2020-01-29 LAB — HEPATIC FUNCTION PANEL
ALT: 35 U/L (ref 0–44)
AST: 63 U/L — ABNORMAL HIGH (ref 15–41)
Albumin: 2.3 g/dL — ABNORMAL LOW (ref 3.5–5.0)
Alkaline Phosphatase: 88 U/L (ref 38–126)
Bilirubin, Direct: 0.2 mg/dL (ref 0.0–0.2)
Indirect Bilirubin: 1.5 mg/dL — ABNORMAL HIGH (ref 0.3–0.9)
Total Bilirubin: 1.7 mg/dL — ABNORMAL HIGH (ref 0.3–1.2)
Total Protein: 6.1 g/dL — ABNORMAL LOW (ref 6.5–8.1)

## 2020-01-29 LAB — BASIC METABOLIC PANEL
Anion gap: 12 (ref 5–15)
BUN: 30 mg/dL — ABNORMAL HIGH (ref 6–20)
CO2: 22 mmol/L (ref 22–32)
Calcium: 8.5 mg/dL — ABNORMAL LOW (ref 8.9–10.3)
Chloride: 99 mmol/L (ref 98–111)
Creatinine, Ser: 3.41 mg/dL — ABNORMAL HIGH (ref 0.61–1.24)
GFR calc Af Amer: 25 mL/min — ABNORMAL LOW (ref >60)
GFR calc non Af Amer: 22 mL/min — ABNORMAL LOW (ref >60)
Glucose, Bld: 412 mg/dL — ABNORMAL HIGH (ref 70–99)
Potassium: 4.2 mmol/L (ref 3.5–5.1)
Sodium: 133 mmol/L — ABNORMAL LOW (ref 135–145)

## 2020-01-29 LAB — LIPID PANEL
Cholesterol: 290 mg/dL — ABNORMAL HIGH (ref 0–200)
HDL: 45 mg/dL (ref >40)
LDL Cholesterol: 176 mg/dL — ABNORMAL HIGH (ref 0–99)
Total CHOL/HDL Ratio: 6.4 RATIO
Triglycerides: 344 mg/dL — ABNORMAL HIGH (ref <150)
VLDL: 69 mg/dL — ABNORMAL HIGH (ref 0–40)

## 2020-01-29 LAB — GLUCOSE, CAPILLARY
Glucose-Capillary: 377 mg/dL — ABNORMAL HIGH (ref 70–99)
Glucose-Capillary: 377 mg/dL — ABNORMAL HIGH (ref 70–99)
Glucose-Capillary: 333 mg/dL — ABNORMAL HIGH (ref 70–99)
Glucose-Capillary: 235 mg/dL — ABNORMAL HIGH (ref 70–99)

## 2020-01-29 LAB — ECHOCARDIOGRAM COMPLETE
Area-P 1/2: 15.17 cm2
Height: 76 in
S' Lateral: 3.7 cm
Weight: 4867.76 oz

## 2020-01-29 MED ORDER — INSULIN ASPART 100 UNIT/ML ~~LOC~~ SOLN
0.0000 [IU] | Freq: Three times a day (TID) | SUBCUTANEOUS | Status: DC
  Administered 2020-01-29: 15 [IU] via SUBCUTANEOUS

## 2020-01-29 MED ORDER — HYDRALAZINE HCL 10 MG PO TABS
10.0000 mg | ORAL_TABLET | Freq: Three times a day (TID) | ORAL | Status: DC
  Administered 2020-01-29 (×3): 10 mg via ORAL
  Filled 2020-01-29 (×3): qty 1

## 2020-01-29 MED ORDER — METOPROLOL TARTRATE 50 MG PO TABS
50.0000 mg | ORAL_TABLET | Freq: Two times a day (BID) | ORAL | Status: DC
  Administered 2020-01-29: 50 mg via ORAL
  Filled 2020-01-29: qty 1

## 2020-01-29 MED ORDER — INSULIN ASPART 100 UNIT/ML ~~LOC~~ SOLN
0.0000 [IU] | Freq: Three times a day (TID) | SUBCUTANEOUS | Status: DC
  Administered 2020-01-29 – 2020-01-30 (×2): 11 [IU] via SUBCUTANEOUS
  Administered 2020-01-30: 5 [IU] via SUBCUTANEOUS
  Administered 2020-01-30 – 2020-01-31 (×2): 2 [IU] via SUBCUTANEOUS

## 2020-01-29 MED ORDER — CLOPIDOGREL BISULFATE 75 MG PO TABS
75.0000 mg | ORAL_TABLET | Freq: Every day | ORAL | Status: DC
  Administered 2020-01-31 – 2020-02-01 (×2): 75 mg via ORAL
  Filled 2020-01-29 (×2): qty 1

## 2020-01-29 MED ORDER — INSULIN ASPART 100 UNIT/ML ~~LOC~~ SOLN
0.0000 [IU] | Freq: Every day | SUBCUTANEOUS | Status: DC
  Administered 2020-01-29: 2 [IU] via SUBCUTANEOUS

## 2020-01-29 MED ORDER — CARVEDILOL 12.5 MG PO TABS
12.5000 mg | ORAL_TABLET | Freq: Two times a day (BID) | ORAL | Status: DC
  Administered 2020-01-29 – 2020-01-31 (×3): 12.5 mg via ORAL
  Filled 2020-01-29 (×4): qty 1

## 2020-01-29 MED ORDER — INSULIN ASPART 100 UNIT/ML ~~LOC~~ SOLN
6.0000 [IU] | Freq: Three times a day (TID) | SUBCUTANEOUS | Status: DC
  Administered 2020-01-30: 6 [IU] via SUBCUTANEOUS

## 2020-01-29 MED ORDER — CLOPIDOGREL BISULFATE 75 MG PO TABS
600.0000 mg | ORAL_TABLET | Freq: Once | ORAL | Status: AC
  Administered 2020-01-30: 600 mg via ORAL
  Filled 2020-01-29: qty 8
  Filled 2020-01-29 (×2): qty 2

## 2020-01-29 NOTE — Progress Notes (Signed)
Progress Note  Patient Name: [name] Date of Encounter: 01/29/2020  [facility] Cardiologist: No primary care provider on file.   Subjective   The patient has intermittent shortness of breath, comes and goes without any precipitating factors.  States this was worse last night after taking Brilinta, less pronounced today.  No chest discomfort.  Feels like it is hard to catch a deep breath.  Blood glucoses are noted to be severely elevated.  Wife is at bedside.  Inpatient Medications    Scheduled Meds: . aspirin  81 mg Oral Daily  . atorvastatin  80 mg Oral Daily  . Chlorhexidine Gluconate Cloth  6 each Topical Daily  . hydrALAZINE  10 mg Oral Q8H  . insulin aspart  0-9 Units Subcutaneous TID WC  . insulin glargine  50 Units Subcutaneous QHS  . metoprolol tartrate  50 mg Oral BID  . pantoprazole  40 mg Oral Daily  . sodium chloride flush  3 mL Intravenous Q12H  . ticagrelor  90 mg Oral BID   Continuous Infusions: . sodium chloride     PRN Meds: sodium chloride, acetaminophen, morphine, ondansetron (ZOFRAN) IV, sodium chloride flush   Vital Signs    Vitals:   01/29/20 0800 01/29/20 0900 01/29/20 1000 01/29/20 1100  BP: (!) 129/114 (!) 126/99 (!) 119/97 114/85  Pulse: (!) 113 (!) 106 95 95  Resp: 17 16 11 14   Temp:      TempSrc:      SpO2: 100% 98% 97% 98%  Weight:      Height:        Intake/Output Summary (Last 24 hours) at 01/29/2020 1110 Last data filed at 01/29/2020 0800 Gross per 24 hour  Intake 1103.14 ml  Output 1000 ml  Net 103.14 ml   Last 3 Weights 01/29/2020 01/28/2020 01/28/2020  Weight (lbs) 304 lb 3.8 oz 300 lb 300 lb  Weight (kg) 138 kg 136.079 kg 136.079 kg      Telemetry    Sinus tachycardia, few PVCs- Personally Reviewed  ECG    Sinus tachycardia 110 bpm, anteroseptal, anterolateral acute infarction with no significant change from previous tracing (persistent ST elevation) - Personally Reviewed  Physical Exam  Alert, oriented,  obese gentleman in no distress GEN: No acute distress.   Neck: No JVD Cardiac:  Tachycardic and regular, no murmurs, rubs, or gallops.  Respiratory: Clear to auscultation bilaterally. GI: Soft, nontender, non-distended  MS: No edema; No deformity. Neuro:  Nonfocal  Psych: Normal affect   Labs    High Sensitivity Troponin:   Recent Labs  Lab 01/28/20 1242 01/28/20 1623  TROPONINIHS >27,000* >27,000*      Chemistry Recent Labs  Lab 01/28/20 1242 01/28/20 1315 01/29/20 0731  NA 138 140 133*  K 3.6 3.6 4.2  CL 103 102 99  CO2 24  --  22  GLUCOSE 217* 206* 412*  BUN 19 21* 30*  CREATININE 2.28* 2.00* 3.41*  CALCIUM 9.0  --  8.5*  PROT 6.6  --  6.1*  ALBUMIN 2.7*  --  2.3*  AST 113*  --  63*  ALT 45*  --  35  ALKPHOS 93  --  88  BILITOT 1.7*  --  1.7*  GFRNONAA 36*  --  22*  GFRAA 41*  --  25*  ANIONGAP 11  --  12     Hematology Recent Labs  Lab 01/28/20 1242 01/28/20 1315 01/29/20 0731  WBC 14.8*  --  17.5*  RBC 4.23  --  3.65*  HGB 12.0* 11.2* 10.5*  HCT 36.5* 33.0* 31.8*  MCV 86.3  --  87.1  MCH 28.4  --  28.8  MCHC 32.9  --  33.0  RDW 12.9  --  13.1  PLT 248  --  214    BNPNo results for input(s): BNP, PROBNP in the last 168 hours.   DDimer No results for input(s): DDIMER in the last 168 hours.   Radiology    CARDIAC CATHETERIZATION  Result Date: 01/28/2020  Prox LAD lesion is 100% stenosed. Late presenting anterior MI, as his symptoms have been going on for about 24 hours.  A drug-eluting stent was successfully placed using a SYNERGY XD 2.50X24, postdilated to greater than 3 mm in diameter.  Post intervention, there is a 0% residual stenosis.  There is moderate to severe left ventricular systolic dysfunction with anterior wall hypokinesis.  LV end diastolic pressure is mildly elevated.  The left ventricular ejection fraction is 25-35% by visual estimate.  There is no aortic valve stenosis.  Watch for 48 hours.  Check echocardiogram.  May  need to consider LifeVest. Holding lisinopril at this time since his creatinine was elevated in the Cath Lab at the time of procedure. 1 year of dual antiplatelet therapy recommended along with aggressive secondary prevention.   DG CHEST PORT 1 VIEW  Result Date: 01/28/2020 CLINICAL DATA:  Shortness of breath. EXAM: PORTABLE CHEST 1 VIEW COMPARISON:  08/01/2019 FINDINGS: Heart size appears enlarged which may reflect artifact from portable technique. There is no pleural effusion identified. Opacities within the left base may represent atelectasis or pneumonia. Right lung appears clear. IMPRESSION: Left base opacity may represent atelectasis or pneumonia. Electronically Signed   By: Kerby Moors M.D.   On: 01/28/2020 18:51   ECHOCARDIOGRAM COMPLETE  Result Date: 01/29/2020    ECHOCARDIOGRAM REPORT   Patient Name:   [name] Date of Exam: 01/29/2020 Medical Rec #:  [id number]    Height:       76.0 in Accession #:    [id number]   Weight:       304.2 lb Date of Birth:  11/21/1985    BSA:          2.648 m Patient Age:    34 years     BP:           129/114 mmHg Patient Gender: M            HR:           113 bpm. Exam Location:  Inpatient Procedure: 2D Echo, Cardiac Doppler and Color Doppler Indications:    Acute MI  History:        Patient has no prior history of Echocardiogram examinations.                 Acute MI and CAD; Risk Factors:Dyslipidemia, Diabetes,                 Hypertension and Obesity.  Sonographer:    [name] Referring Phys: [name]  1. Left ventricular ejection fraction, by estimation, is 30 to 35%. The left ventricle has moderately decreased function. The left ventricle demonstrates regional wall motion abnormalities (see scoring diagram/findings for description). Anterior/septal akinesis. There is moderate left ventricular hypertrophy. Left ventricular diastolic parameters are indeterminate.  2. Right ventricular systolic function is normal. The right  ventricular size is normal.  3. The mitral valve is normal in structure. No evidence of mitral valve regurgitation.  4. The aortic valve is tricuspid. Aortic valve regurgitation is not visualized. No aortic stenosis is present.  5. Small pericardial effusion, measures up to 0.5cm adjacent to RV free wall at end diastole. No RV diastolic collapse seen.  6. The inferior vena cava is normal in size with <50% respiratory variability, suggesting right atrial pressure of 8 mmHg. FINDINGS  Left Ventricle: Left ventricular ejection fraction, by estimation, is 30 to 35%. The left ventricle has moderately decreased function. The left ventricle demonstrates regional wall motion abnormalities. The left ventricular internal cavity size was normal in size. There is moderate left ventricular hypertrophy. Left ventricular diastolic parameters are indeterminate.  LV Wall Scoring: The mid and distal anterior wall, mid and distal anterior septum, mid inferoseptal segment, and apex are akinetic. The basal anteroseptal segment, basal anterior segment, and basal inferoseptal segment are hypokinetic. The entire lateral wall and entire inferior wall are normal. Right Ventricle: The right ventricular size is normal. Right vetricular wall thickness was not assessed. Right ventricular systolic function is normal. Left Atrium: Left atrial size was normal in size. Right Atrium: Right atrial size was normal in size. Pericardium: A small pericardial effusion is present. Mitral Valve: The mitral valve is normal in structure. No evidence of mitral valve regurgitation. Tricuspid Valve: The tricuspid valve is normal in structure. Tricuspid valve regurgitation is trivial. Aortic Valve: The aortic valve is tricuspid. Aortic valve regurgitation is not visualized. No aortic stenosis is present. Pulmonic Valve: The pulmonic valve was not well visualized. Pulmonic valve regurgitation is not visualized. Aorta: The aortic root is normal in size and  structure. Venous: The inferior vena cava is normal in size with less than 50% respiratory variability, suggesting right atrial pressure of 8 mmHg. IAS/Shunts: The interatrial septum was not well visualized.  LEFT VENTRICLE PLAX 2D LVIDd:         4.80 cm  Diastology LVIDs:         3.70 cm  LV e' lateral:   6.85 cm/s LV PW:         1.30 cm  LV E/e' lateral: 10.6 LV IVS:        1.40 cm  LV e' medial:    7.29 cm/s LVOT diam:     2.20 cm  LV E/e' medial:  10.0 LV SV:         41 LV SV Index:   16 LVOT Area:     3.80 cm  RIGHT VENTRICLE RV Basal diam:  3.30 cm RV S prime:     13.50 cm/s TAPSE (M-mode): 2.2 cm LEFT ATRIUM             Index       RIGHT ATRIUM           Index LA diam:        4.00 cm 1.51 cm/m  RA Area:     12.30 cm LA Vol (A2C):   55.4 ml 20.93 ml/m RA Volume:   28.40 ml  10.73 ml/m LA Vol (A4C):   71.0 ml 26.82 ml/m LA Biplane Vol: 66.5 ml 25.12 ml/m  AORTIC VALVE LVOT Vmax:   69.20 cm/s LVOT Vmean:  48.500 cm/s LVOT VTI:    0.109 m  AORTA Ao Root diam: 2.90 cm MITRAL VALVE MV Area (PHT): 15.17 cm   SHUNTS MV Decel Time: 50 msec     Systemic VTI:  0.11 m MV E velocity: 72.80 cm/s  Systemic Diam: 2.20 cm MV A velocity: 71.10 cm/s MV E/A ratio:  1.02 Oswaldo Milian MD Electronically signed by Oswaldo Milian MD Signature Date/Time: 01/29/2020/10:46:34 AM    Final     Cardiac Studies   Echo: IMPRESSIONS    1. Left ventricular ejection fraction, by estimation, is 30 to 35%. The  left ventricle has moderately decreased function. The left ventricle  demonstrates regional wall motion abnormalities (see scoring  diagram/findings for description). Anterior/septal  akinesis. There is moderate left ventricular hypertrophy. Left ventricular  diastolic parameters are indeterminate.  2. Right ventricular systolic function is normal. The right ventricular  size is normal.  3. The mitral valve is normal in structure. No evidence of mitral valve  regurgitation.  4. The aortic valve  is tricuspid. Aortic valve regurgitation is not  visualized. No aortic stenosis is present.  5. Small pericardial effusion, measures up to 0.5cm adjacent to RV free  wall at end diastole. No RV diastolic collapse seen.  6. The inferior vena cava is normal in size with <50% respiratory  variability, suggesting right atrial pressure of 8 mmHg.   Cath: Conclusion    Prox LAD lesion is 100% stenosed. Late presenting anterior MI, as his symptoms have been going on for about 24 hours.  A drug-eluting stent was successfully placed using a SYNERGY XD 2.50X24, postdilated to greater than 3 mm in diameter.  Post intervention, there is a 0% residual stenosis.  There is moderate to severe left ventricular systolic dysfunction with anterior wall hypokinesis.  LV end diastolic pressure is mildly elevated.  The left ventricular ejection fraction is 25-35% by visual estimate.  There is no aortic valve stenosis.   Watch for 48 hours.  Check echocardiogram.  May need to consider LifeVest.  Holding lisinopril at this time since his creatinine was elevated in the Cath Lab at the time of procedure.  1 year of dual antiplatelet therapy recommended along with aggressive secondary prevention.  Diagnostic Dominance: Co-dominant  Intervention     Patient Profile     34 y.o. male with longstanding type 2 diabetes now with late presenting anterior STEMI, initial troponin on presentation greater than 27,000  Assessment & Plan    1.  Acute anterior STEMI, Late presentation: Found to have occluded proximal LAD at cath, treated with PCI using a drug-eluting stent.  Prognosis for LV recovery is poor with clinical symptoms greater than 24 hours prior to presentation, initial troponin greater than 27,000, severe LV dysfunction by echo, and persistent ST elevation on EKG.  We will change him from metoprolol to carvedilol today.  Unable to treat him with ACE/ARB/Aldo antagonist because of acute kidney  injury.  Appears stable to transfer to a cardiac telemetry bed.  I think he is having shortness of breath related to ticagrelor.  He seems to be tolerating this reasonably well with less pronounced symptoms today.  We will give him another 24 hours, but if symptoms persist I would be inclined to switch him to clopidogrel tomorrow.  If he is changed, he will require a 600 mg loading dose of clopidogrel.  2.  Acute systolic heart failure: LVEF less than 35%.  Patient has been hypertensive, remains tachycardic.  Current medicines include metoprolol.  Currently not a candidate for ACE/ARB/Entresto/aldosterone antagonist because of acute kidney injury.  As he progresses, will consider wearable defibrillator (LifeVest).  3.  Acute kidney injury: Baseline creatinine from February of this year was 1.19.  On arrival his creatinine was 2.28 mg/dL, today 3.41 mg/dL.  1 L of urine output recorded last shift.  Fortunately  remains nonoliguric.  We will follow creatinine closely.  Hold diuretic therapy for now.  No pulmonary edema on chest x-ray last night.  Avoid nephrotoxic drugs including ACE/ARB.  4.  Diabetes, uncontrolled: Hemoglobin A1c 10.1.  Blood glucose this morning is greater than 400.  Received Lantus insulin 50 units last night.  Sliding scale insulin given this morning.  Will add mealtime insulin coverage.  Await diabetes nurse evaluation.  Disposition: Transfer to telemetry today, repeat creatinine tomorrow morning, avoid diuretics and nephrotoxic drugs today.  Anticipate at least another 48 to 72 hours in the hospital.  For questions or updates, please contact [place] Please consult [email or web address] for contact info under        Signed, Sherren Mocha, MD  01/29/2020, 11:10 AM

## 2020-01-29 NOTE — Significant Event (Signed)
Called to the bedside:  Patient presenting with worsening shortness of breath and nausea.  Went to restroom with nurse.  Tired and somnolent post returning to bed.  Patient notes he thinks he passed out in the bed.  BP 95/74 NIBP.  Held PM hydralazine.  At assessment; patients BP 113/70.  Patient feels much better.  No SOB currently, but it waxes and wanes.  Discussed prior notes from Dr. Burt Knack and consideration of transition to Clopidogrel.  Patient warm and well perfused, access site c/d/i. Tele shows sinus rhythm with persistent ST changes.  Will DC hydalazine; and transition to clopidogrel.  Discussed with patient and nursing team.  Greg Emerald MD

## 2020-01-29 NOTE — Progress Notes (Signed)
EKG CRITICAL VALUE     12 lead EKG performed.  Critical value noted.  Vernie Shanks, RN notified.   Genia Plants, CCT 01/29/2020 8:06 AM

## 2020-01-29 NOTE — Progress Notes (Signed)
Pt advises he just returned from the bathroom feeling lightheaded/dizzy and nauseated. He also was feeling short of breath. Checked patients vitals signs and BP was 95/73 HR 94 O2 sats 97%. This RN left the room to get Zofran. Upon return, pt had vomited and was slumped over in bed. When asked what happened, pt states he doesn't know. He may have passed out. Notified MD on call. Received orders.

## 2020-01-29 NOTE — Progress Notes (Signed)
Inpatient Diabetes Program Recommendations  AACE/ADA: New Consensus Statement on Inpatient Glycemic Control (2015)  Target Ranges:  Prepandial:   less than 140 mg/dL      Peak postprandial:   less than 180 mg/dL (1-2 hours)      Critically ill patients:  140 - 180 mg/dL   Lab Results  Component Value Date   GLUCAP 377 (H) 01/29/2020   HGBA1C 10.1 (H) 01/28/2020    Review of Glycemic Control Results for QUASHAUN, LAZALDE (MRN [id number]) as of 01/29/2020 12:41  Ref. Range 01/28/2020 14:40 01/28/2020 17:05 01/28/2020 21:44 01/29/2020 06:34 01/29/2020 11:39  Glucose-Capillary Latest Ref Range: 70 - 99 mg/dL 190 (H) 206 (H) 296 (H) 377 (H) 377 (H)   Diabetes history: DM2 Outpatient Diabetes medications: Lantus 45-50 units qd + Novolog 5-10 units tid meal coverage Current orders for Inpatient glycemic control: Lantus 60 units daily + Novolog moderate correction tid  Inpatient Diabetes Program Recommendations:   -Add Novolog 10 units tid meal coverage if eats 50% -Add Novolog 0-5 units hs correction Secure chat to [employer] PA with recommendations  Thank you, Nani Gasser. Zephan Beauchaine, RN, MSN, CDE  Diabetes Coordinator Inpatient Glycemic Control Team Team Pager [phone] (8am-5pm) 01/29/2020 12:43 PM

## 2020-01-29 NOTE — Progress Notes (Signed)
Patient transferred to [phone] on telemetry with belongings. Wife, Keane Scrape, transferred with patient as well.  Kristen RN to receive patient.

## 2020-01-29 NOTE — Progress Notes (Signed)
Sent message to Lifevest rep to request Livevest.  Order faxed.  Angelyna Henderson PA-C

## 2020-01-29 NOTE — Progress Notes (Signed)
Benefits check in process for Please check Brilinta 90mg  twice a day. TOC team to f/u with needs..... Whitman Hero RN,BSN,CM

## 2020-01-29 NOTE — TOC Benefit Eligibility Note (Signed)
Transition of Care [facility]) Benefit Eligibility Note    Patient Details  Name: [name] MRN: [id number] Date of Birth: 1985-12-26   Medication/Dose: BRILINTA  90 MG BID  Covered?: Yes  Tier: 3 Drug  Prescription Coverage Preferred Pharmacy: [facility] with Person/Company/Phone Number:: JULIAN  @ [employer] BS # [phone] OPT- 2  Co-Pay: $25.00  Prior Approval: No  Deductible: Met       Memory Argue Phone Number: 01/29/2020, 1:01 PM

## 2020-01-29 NOTE — Progress Notes (Signed)
CARDIAC REHAB PHASE I   PRE:  Rate/Rhythm: 99 SR  BP:  Sitting: 133/96      SaO2: 98 RA  MODE:  Ambulation: 370 ft   POST:  Rate/Rhythm: 111 ST  BP:  Sitting: 136/106    SaO2: 99 RA  Pt ambulated 31ft in hallway independently with steady gait. Pt denies CP, does c/o some SOB. Pt coached through purse lipped breathing, sats maintained throughout session. Pt and wife educated on importance of ASA, Brilinta, statin, and NTG. Pt given heart attack book and stent card along with heart healthy and diabetic diets. Reviewed site care, restrictions, and exercise guidelines. Will continue to follow. Will refer to CRP II [place]. Pt is interested in participating in Virtual Cardiac and Pulmonary Rehab. Pt advised that Virtual Cardiac and Pulmonary Rehab is provided at no cost to the patient.  Checklist:  1. Pt has smart device  ie smartphone and/or ipad for downloading an app  Yes 2. Reliable internet/wifi service    Yes 3. Understands how to use their smartphone and navigate within an app.  Yes  Pt verbalized understanding and is in agreement.  [id number] Rufina Falco, RN BSN 01/29/2020 2:01 PM

## 2020-01-29 NOTE — Progress Notes (Signed)
  Echocardiogram 2D Echocardiogram has been performed.  [name] 01/29/2020, 8:47 AM

## 2020-01-30 ENCOUNTER — Inpatient Hospital Stay (HOSPITAL_COMMUNITY): Payer: 59

## 2020-01-30 DIAGNOSIS — I313 Pericardial effusion (noninflammatory): Secondary | ICD-10-CM

## 2020-01-30 LAB — GLUCOSE, CAPILLARY
Glucose-Capillary: 224 mg/dL — ABNORMAL HIGH (ref 70–99)
Glucose-Capillary: 309 mg/dL — ABNORMAL HIGH (ref 70–99)
Glucose-Capillary: 132 mg/dL — ABNORMAL HIGH (ref 70–99)
Glucose-Capillary: 155 mg/dL — ABNORMAL HIGH (ref 70–99)
Glucose-Capillary: 128 mg/dL — ABNORMAL HIGH (ref 70–99)

## 2020-01-30 LAB — BASIC METABOLIC PANEL
Anion gap: 12 (ref 5–15)
BUN: 47 mg/dL — ABNORMAL HIGH (ref 6–20)
CO2: 22 mmol/L (ref 22–32)
Calcium: 8.6 mg/dL — ABNORMAL LOW (ref 8.9–10.3)
Chloride: 100 mmol/L (ref 98–111)
Creatinine, Ser: 6.22 mg/dL — ABNORMAL HIGH (ref 0.61–1.24)
GFR calc Af Amer: 12 mL/min — ABNORMAL LOW (ref >60)
GFR calc non Af Amer: 11 mL/min — ABNORMAL LOW (ref >60)
Glucose, Bld: 242 mg/dL — ABNORMAL HIGH (ref 70–99)
Potassium: 3.9 mmol/L (ref 3.5–5.1)
Sodium: 134 mmol/L — ABNORMAL LOW (ref 135–145)

## 2020-01-30 LAB — CBC
HCT: 30.1 % — ABNORMAL LOW (ref 39.0–52.0)
Hemoglobin: 9.7 g/dL — ABNORMAL LOW (ref 13.0–17.0)
MCH: 27.7 pg (ref 26.0–34.0)
MCHC: 32.2 g/dL (ref 30.0–36.0)
MCV: 86 fL (ref 80.0–100.0)
Platelets: 213 10*3/uL (ref 150–400)
RBC: 3.5 MIL/uL — ABNORMAL LOW (ref 4.22–5.81)
RDW: 13 % (ref 11.5–15.5)
WBC: 14.2 10*3/uL — ABNORMAL HIGH (ref 4.0–10.5)
nRBC: 0 % (ref 0.0–0.2)

## 2020-01-30 LAB — ECHOCARDIOGRAM LIMITED
Height: 76 in
S' Lateral: 3.68 cm
Weight: 4881.87 oz

## 2020-01-30 MED ORDER — INSULIN ASPART 100 UNIT/ML ~~LOC~~ SOLN
12.0000 [IU] | Freq: Three times a day (TID) | SUBCUTANEOUS | Status: DC
  Administered 2020-01-30 – 2020-02-01 (×3): 12 [IU] via SUBCUTANEOUS

## 2020-01-30 MED ORDER — INSULIN ASPART 100 UNIT/ML ~~LOC~~ SOLN
10.0000 [IU] | Freq: Three times a day (TID) | SUBCUTANEOUS | Status: DC
  Administered 2020-01-30: 10 [IU] via SUBCUTANEOUS

## 2020-01-30 MED ORDER — FUROSEMIDE 10 MG/ML IJ SOLN
140.0000 mg | Freq: Once | INTRAVENOUS | Status: AC
  Administered 2020-01-30: 140 mg via INTRAVENOUS
  Filled 2020-01-30: qty 10

## 2020-01-30 MED ORDER — INSULIN GLARGINE 100 UNIT/ML ~~LOC~~ SOLN
60.0000 [IU] | Freq: Every day | SUBCUTANEOUS | Status: DC
  Administered 2020-01-30 – 2020-01-31 (×2): 60 [IU] via SUBCUTANEOUS
  Filled 2020-01-30 (×4): qty 0.6

## 2020-01-30 NOTE — Progress Notes (Signed)
CARDIAC REHAB PHASE I   Went to offer to walk with pt, pt on phone with wife. In good spirits, denies questions or concerns at this time. Will continue to follow.  Rufina Falco, RN BSN 01/30/2020 2:54 PM

## 2020-01-30 NOTE — Progress Notes (Signed)
Inpatient Diabetes Program Recommendations  AACE/ADA: New Consensus Statement on Inpatient Glycemic Control (2015)  Target Ranges:  Prepandial:   less than 140 mg/dL      Peak postprandial:   less than 180 mg/dL (1-2 hours)      Critically ill patients:  140 - 180 mg/dL   Lab Results  Component Value Date   GLUCAP 309 (H) 01/30/2020   HGBA1C 10.1 (H) 01/28/2020    Review of Glycemic Control Results for RICCO, DERSHEM (MRN [id number]) as of 01/30/2020 13:24  Ref. Range 01/29/2020 16:12 01/29/2020 20:26 01/30/2020 08:00 01/30/2020 11:33  Glucose-Capillary Latest Ref Range: 70 - 99 mg/dL 333 (H) 235 (H) 224 (H) 309 (H)   Diabetes history: Type 2 DM Outpatient Diabetes medications: Lantus 50 units QD, Novolog 5-10 units TID Current orders for Inpatient glycemic control: Novolog 10 units TID, Lantus 50 units QHS, Novolog 0-15 units TID, Novolog 0-5 units QHS  Inpatient Diabetes Program Recommendations:    Consider increasing Lantus to 60 units QHS, Novolog 12 units TID (asusming patient is consuming >50% of meals).   For discharge: Freestyle Libre 2 Sensors (2) 14 day [phone]) Glucose meter kit (includes lancets and strips) (#[id number])   Spoke with patient and wife regarding outpatient diabetes management. Verified home medications and patient denies missing doses. Admits to not following up with PCP regularly since the pandemic.  Reviewed patient's current A1c of 10.1%. Explained what a A1c is and what it measures. Also reviewed goal A1c with patient, importance of good glucose control @ home, and blood sugar goals. Reviewed patho of DM, need for insulin, role of pancreas, impact of poor glycemic control on heart function, vascular changes and other commorbidities.  Patient has a meter and was using 1-2 times per day. Reviewed recommended frequency of 3-4 times per day and when to call MD. Will attach information about endocrinology and reviewed the importance of establishing better control.  Also, discussed [employer] as an option for CGM. Reviewed cost, benefits, action and application. Patient is interested. Order for sample application provided by [place], [place]. Will plan to place closer to discharge.  Patient denies drinking sugary beverages or consuming large amount of carbohydrates. Wife verifies. States, "I am the cook in the family and work hard to ensure healthy, low carb meals are prepared." Reviewed plate method, nutritional labels, daily alotment and counting carbs with patient and wife as she may have to help at discharge. Patient is aware and knowledgeable. Will place consult for dietitian and outpatient referral as additional review and reinforcement.  Stressed the importance of long term follow up for diabetes, when to call MD and when patient would need insulin adjustments. Patient has no further questions at this time.   Thanks, Bronson Curb, MSN, RNC-OB Diabetes Coordinator [phone] ([phone])

## 2020-01-30 NOTE — Progress Notes (Signed)
  Echocardiogram 2D Echocardiogram has been performed.  [name] 01/30/2020, 10:58 AM

## 2020-01-30 NOTE — Progress Notes (Signed)
Spoke to provider regarding pt low bp of 99/70. Per provider, hold pt's scheduled coreg, give pt fluids, and encourage pt to drink. Will continue to monitor pt.

## 2020-01-30 NOTE — Progress Notes (Addendum)
DM coordinator suggests increasing meal coverage to 12 u TID and Lantus to 60 u - agree. Will adjust in MAR. Also discussed improved LVEF with Dr. Burt Knack. Since EF is >35%, no longer need to plan for Life[facility] at DC. Notified rep. Cinnamon Morency PA-C

## 2020-01-30 NOTE — Progress Notes (Addendum)
Nutrition Education Note  RD consulted for nutrition education regarding CHF and diabetes.  Lab Results  Component Value Date   HGBA1C 10.1 (H) 01/28/2020     RD attached "Heart Healthy, Consistent Carbohydrate Nutrition Therapy" handout from the Academy of [employer] and Dietetics to discharge instructions. Reviewed patient's dietary recall. Provided examples on ways to decrease sodium intake in diet. Discouraged intake of processed foods and use of salt shaker. Encouraged fresh fruits and vegetables as well as whole grain sources of carbohydrates to maximize fiber intake.   RD discussed why it is important for patient to adhere to diet recommendations, and emphasized the role of fluids, foods to avoid, and importance of weighing self daily.   Discussed different food groups and their effects on blood sugar, emphasizing carbohydrate-containing foods. Provided list of carbohydrates and recommended serving sizes of common foods.  Discussed importance of controlled and consistent carbohydrate intake throughout the day. Provided examples of ways to balance meals/snacks and encouraged intake of high-fiber, whole grain complex carbohydrates. Teach back method used.  Expect good compliance.  Body mass index is 37.14 kg/m. Pt meets criteria for obesity based on current BMI.  Current diet order is CHO modified, patient is consuming approximately 75% of meals at this time. Labs and medications reviewed. No further nutrition interventions warranted at this time. RD contact information provided. If additional nutrition issues arise, please re-consult RD.    [name], RD, LDN, CNSC Please refer to [facility] for contact information.

## 2020-01-30 NOTE — Discharge Instructions (Addendum)
Information about your medication: Plavix (anti-platelet agent)  Generic Name (Brand): clopidogrel (Plavix), once daily medication  PURPOSE: You are taking this medication along with aspirin to lower your chance of having a heart attack, stroke, or blood clots in your heart stent. These can be fatal. Plavix and aspirin help prevent platelets from sticking together and forming a clot that can block an artery or your stent.   Common SIDE EFFECTS you may experience include: bruising or bleeding more easily, shortness of breath  Do not stop taking PLAVIX without talking to the doctor who prescribes it for you. People who are treated with a stent and stop taking Plavix too soon, have a higher risk of getting a blood clot in the stent, having a heart attack, or dying. If you stop Plavix because of bleeding, or for other reasons, your risk of a heart attack or stroke may increase.   Avoid taking NSAID agents or anti-inflammatory medications such as ibuprofen, naproxen given increased bleed risk with plavix - can use acetaminophen (Tylenol) if needed for pain.  Avoid taking over the counter stomach medications omeprazole (Prilosec) or esomeprazole (Nexium) since these do interact and make plavix less effective - ask your pharmacist or doctor for alterative agents if needed for heartburn or GERD.   Tell all of your doctors and dentists that you are taking Plavix. They should talk to the doctor who prescribed Plavix for you before you have any surgery or invasive procedure.   Contact your health care provider if you experience: severe or uncontrollable bleeding, pink/red/brown urine, vomiting blood or vomit that looks like "coffee grounds", red or black stools (looks like tar), coughing up blood or blood clots ----------------------------------------------------------------------------------------------------------------------  Heart-Healthy Consistent Carbohydrate Nutrition Therapy  A heart-healthy and  consistent carbohydrate diet is recommended to manage heart disease and diabetes. To follow a heart-healthy and consistent carbohydrate diet, Eat a balanced diet with whole grains, fruits and vegetables, and lean protein sources. Choose heart-healthy unsaturated fats. Limit saturated fats, trans fats, and cholesterol intake. Eat more plant-based or vegetarian meals using beans and soy foods for protein. Eat whole, unprocessed foods to limit the amount of sodium (salt) you eat. Choose a consistent amount of carbohydrate at each meal and snack. Limit refined carbohydrates especially sugar, sweets and sugar-sweetened beverages. If you drink alcohol, do so in moderation: one serving per day (women) and two servings per day (men). o One serving is equivalent to 12 ounces beer, 5 ounces wine, or 1.5 ounces distilled spirits  Tips  Tips for Choosing Heart-Healthy Fats  Choose lean protein and low-fat dairy foods to reduce saturated fat intake.  Saturated fat is usually found in animal-based protein and is associated with certain health risks. Saturated fat is the biggest contributor to raise low-density lipoprotein (LDL) cholesterol levels. Research shows that limiting saturated fat lowers unhealthy cholesterol levels. Eat no more than 7% of your total calories each day from saturated fat. Ask your RDN to help you determine how much saturated fat is right for you.  There are many foods that do not contain large amounts of saturated fats. Swapping these foods to replace foods high in saturated fats will help you limit the saturated fat you eat and improve your cholesterol levels. You can also try eating more plant-based or vegetarian meals.  Instead of.  Try: Whole milk, cheese, yogurt, and ice cream                                                                                               1% or skim milk, low-fat  cheese, non-fat yogurt,                                                                                               and low-fat ice cream  Fatty, marbled beef and pork                                                Lean beef, pork, or venison  Poultry with skin                                                                    Poultry without skin  Butter, stick margarine                                                         Reduced-fat, whipped, or liquid spreads  Coconut oil, palm oil                                                             Liquid vegetable oils: corn, canola, olive, soybean                                                                                               and safflower oils  Copyright Academy of Nutrition and Dietetics. This handout may be duplicated for client education. Page 2/6  Avoid foods that contain trans fats.  Trans fats increase levels of LDL-cholesterol. Hydrogenated fat in processed foods is the main source of trans fats in Foods.  Trans fats can be found in stick margarine, shortening, processed sweets, baked goods, some fried foods, and packaged foods made with hydrogenated oils. Avoid foods with "partially hydrogenated oil" on the ingredient list such as: cookies, pastries, baked goods, biscuits, crackers, microwave popcorn, and frozen dinners.  Choose foods with heart healthy fats.  Polyunsaturated and monounsaturated fat are unsaturated fats that may help lower your blood cholesterol level when used in place of saturated fat in your diet.  Ask your RDN about taking a dietary supplement with plant sterols and stanols to help lower your cholesterol level. Research shows that substituting saturated fats with unsaturated fats is beneficial to cholesterol levels.   Limit the amount of cholesterol you eat to less than 200 milligrams per day.  Cholesterol is a substance carried through the bloodstream via lipoproteins, which are known  as "transporters" of fat. Some body functions need cholesterol to work properly, but too much cholesterol in the bloodstream can damage arteries and build up blood vessel linings (which can lead to heart attack and stroke). You should eat less than 200 milligrams cholesterol per day.  People respond differently to eating cholesterol. There is no test available right now that can figure out which people will respond more to dietary cholesterol and which will respond less. For individuals with high intake of dietary cholesterol, different types of increase (none, small, moderate, large) in LDL-cholesterol levels are all possible. Food sources of cholesterol include egg yolks and organ meats such as liver, gizzards. Limit egg yolks to two to four per week and avoid organ meats like liver and gizzards to control cholesterol intake.   Copyright Academy of Nutrition and Dietetics. This handout may be duplicated for client education. Page 3/6  Tips for Choosing Heart-Healthy Carbohydrates  Consume a consistent amount of carbohydrate  It is important to eat foods with carbohydrates in moderation because they impact your blood glucose level. Carbohydrates can be found in many foods such as: Grains (breads, crackers, rice, pasta, and cereals) Starchy Vegetables (potatoes, corn, and peas) Beans and legumes Milk, soy milk, and yogurt Fruit and fruit juice Sweets (cakes, cookies, ice cream, jam and jelly) Your RDN will help you set a goal for how many carbohydrate servings to eat at your meals and snacks. For many adults, eating 3 to 5 servings of carbohydrate foods at each meal and 1 or 2 carbohydrate servings for each snack works well.  Check your blood glucose level regularly. It can tell you if you need to adjust when you eat carbohydrates.  Choose foods rich in viscous (soluble) fiber  Viscous, or soluble, is found in the walls of plant cells. Viscous fiber is found only in plant-based  foods. Eating foods with fiber helps to lower your unhealthy cholesterol and keep your blood glucose in range Rich sources of viscous fiber include vegetables (asparagus, Brussels sprouts, sweet potatoes, turnips) fruit (apricots, mangoes, oranges), legumes, and whole grains (barley, oats, and oat bran). As you increase your fiber intake gradually, also increase the amount of water you drink. This will help prevent constipation.  If you have difficulty achieving this goal, ask your RDN about fiber laxatives. Choose fiber supplements made with viscous fibers such as psyllium seed husks or methylcellulose to help lower unhealthy cholesterol.  Limit refined carbohydrates  There are three types of carbohydrates: starches, sugar, and fiber. Some carbohydrates occur naturally in  food, like the starches in rice or corn or the sugars in fruits and milk. Refined carbohydrates--foods with high amounts of simple sugars--can raise triglyceride levels. High triglyceride levels are associated with coronary heart disease. Some examples of refined carbohydrate foods are table sugar, sweets, and beverages sweetened with added sugar.  Tips for Reducing Sodium (Salt)  Although sodium is important for your body to function, too much sodium can be harmful for people with high blood pressure. As sodium and fluid buildup in your tissues and bloodstream, your blood pressure increases. High blood pressure may cause damage to other organs and increase your risk for a stroke. Even if you take a pill for blood pressure or a water pill (diuretic) to remove fluid, it is still important to have less salt in your diet. Ask your doctor and RDN what amount of sodium is right for you. Avoid processed foods. Eat more fresh foods. Fresh fruits and vegetables are naturally low in sodium, as well as frozen vegetables and fruits that have no added juices or sauces. Fresh meats are lower in sodium than processed meats, such as  bacon, sausage, and hotdogs. Read the nutrition label or ask your butcher to help you find a fresh meat that is low in sodium. Eat less salt--at the table and when cooking. A single teaspoon of table salt has 2,300 mg of sodium. Leave the salt out of recipes for pasta, casseroles, and soups. Ask your RDN how to cook your favorite recipes without sodium Be a smart shopper. Copyright Academy of Nutrition and Dietetics. This handout may be duplicated for client education. Page 4/6 Look for food packages that say "salt-free" or "sodium-free." These items contain less than 5 milligrams of sodium per serving. "Very low-sodium" products contain less than 35 milligrams of sodium per serving. "Low-sodium" products contain less than 140 milligrams of sodium per serving. Beware for "Unsalted" or "No Added Salt" products. These items may still be high in sodium. Check the nutrition label. Add flavors to your food without adding sodium. Try lemon juice, lime juice, fruit juice or vinegar. Dry or fresh herbs add flavor. Try basil, bay leaf, dill, rosemary, parsley, sage, dry mustard, nutmeg, thyme, and paprika. Pepper, red pepper flakes, and cayenne pepper can add spice t your meals without adding sodium. Hot sauce contains sodium, but if you use just a drop or two, it will not add up to much. Buy a sodium-free seasoning blend or make your own at home.  Additional Lifestyle Tips  Achieve and maintain a healthy weight. Talk with your RDN or your doctor about what is a healthy weight for you. Set goals to reach and maintain that weight. To lose weight, reduce your calorie intake along with increasing your physical activity. A weight loss of 10 to 15 pounds could reduce LDL-cholesterol by 5 milligrams per deciliter.  Participate in physical activity. Talk with your health care team to find out what types of physical activity are best for you. Set a plan to get about 30 minutes of exercise on most  days.  Foods Recommended Grains:  Whole grain breads and cereals, including whole wheat, barley, rye, buckwheat, corn, teff, quinoa, millet, amaranth, brown or wild rice, sorghum, and oats Pasta, especially whole wheat or other whole grain types [employer], quinoa or wild rice Whole grain crackers, bread, r olls, pitas Home-made bread with reduced-sodium baking soda  Protein Foods: Lean cuts of beef and pork (loin, leg, round, extra lean hamburger) Skinless Cytogeneticist and  other wild game Dried beans and peas Nuts and nut butters Meat alternatives mad e with soy or textured vegetable protein Egg whites or egg substitute Cold cuts made with lean meat or soy protein  Copyright Academy of Nutrition and Dietetics. This handout may be duplicated for client education. Page 5/6  Dairy: Nonfat (skim), low-fat, or 1%-fat milk Nonfat or low-fat yogurt or cottage ch eese Fat-free and low-fat cheese  Vegetables: Fresh, frozen, or canned vegetables without added fat or salt Fruits Fresh, frozen, canned, or dried fruit  Oils: Unsaturated oils (corn, olive, peanut, soy, sunflower, canola) Soft or liquid margarines and vegetable oil spreads Salad dressings Seeds and nuts Avocado   Foods Not Recommended  Grains: Breads or crackers topped with salt Cereals (hot or cold) with more than 300 mg sodium per serving Biscuits, cornbread, and other "quick" breads prepared with bak ing soda Bread crumbs or stuffing mix from a store High-fat bakery products, such as doughn uts, biscuits, croissants, danish pastries, pies, cookies Instant cooking foods to which you add hot water and stir--potatoes, noodles, rice, etc. Packaged starchy foods--seasoned noodle or rice dishes, stuffing mix, macaroni and che ese dinner Snacks made with partially hydrogenated oils, including chips, cheese puffs, snack mixes, regular c rackers, butter-flavored popcorn  Protein Foods: Higher-fat cuts of  meats (ribs, t-bone steak, regular hamburger) Bacon, sausage, or hot dogs Cold cuts, such as salami or bologna, deli meats, cured meats, corned beef Organ meats (liver, brains, gizzards, sweetbreads) Poultry with skin Fried or smoked meat, poultry, and fish Whole eggs and egg yolks (more than 2 -4 per week) Salted legumes, nuts, seeds, or nut/seed butters Meat alternatives with high levels of sodium (>30 0 mg per serving) or saturated fat (>5 g per serving)  Dairy: Whole milk, 2% fat milk, buttermilk Whole milk yogurt or ice cream Cream Half-&- half Cream cheese Sour cream Cheese  Vegetables: Canned or frozen vegetables with salt, fresh vegetables prepared with salt, butter, cheese, or cream sauce Fried vegetables Pickled vegetables such as olives, pickles, or sauerkraut  Fruits: Fried fruits Fruits served with butter or cream  Copyright Academy of Nutrition and Dietetics. This handout may be duplicated for client education. Page 6/6  Oils: Butter, stick margarine, shortening Partially hydrogenated oils or trans fats Tropical oils (coconut, palm, palm kerne l oils)  Other: Candy, sugar sweetened soft drinks and desserts Salt, sea salt, garlic salt, and seasoning mixes con taining salt Bouillon cubes Ketchup, barbe cue sauce, Worcestershire sauce, soy sauce, teriyaki sauce Miso Salsa Pickles, olives, relish  Heart Healthy Consistent Carbohydrate Sample 1-Day Menu Breakfast 1 cup cooked oatmeal (2 carbohydrate servings) 3/4 cup blueberries (1 carbohydrate serving) 1 ounce almonds 1 cup skim milk (1 carbohydrate serving) 1 cup coffee  Morning Snack 1 cup sugar-free nonfat yogurt (1 carbohydrate serving)  Lunch 2 slices whole-wheat bread (2 carbohydrate servings) 2 ounces lean [place] breast 1 ounce low-fat Swiss cheese 1 teaspoon mustard 1 slice tomato 1 lettuce leaf 1 small pear (1 carbohydrate serving) 1 cup skim milk (1 carbohydrate serving)  Afternoon  Snack 1 ounce trail mix with unsalted nuts, seeds, and raisins (1 carbohydrate serving)  Evening Meal 3 ounces salmon 2/3 cup cooked brown rice (2 carbohydrate servings) 1 teaspoon soft margarine 1 cup cooked broccoli with 1/2 cup cooked carrots (1 carbohydrate serving Carrots, cooked, boiled, drained, without salt 1 cup lettuce 1 teaspoon olive oil with vinegar for dressing 1 small whole grain roll (1 carbohydrate serving) 1 teaspoon soft margarine 1 cup  unsweetened tea  Evening Snack 1 extra-small banana (1 carbohydrate serving)    Hyperglycemia Hyperglycemia occurs when the level of sugar (glucose) in the blood is too high. Glucose is a type of sugar that provides the body's main source of energy. Certain hormones (insulin and glucagon) control the level of glucose in the blood. Insulin lowers blood glucose, and glucagon increases blood glucose. Hyperglycemia can result from having too little insulin in the bloodstream, or from the body not responding normally to insulin. Hyperglycemia occurs most often in people who have diabetes (diabetes mellitus), but it can happen in people who do not have diabetes. It can develop quickly, and it can be life-threatening if it causes you to become severely dehydrated (diabetic ketoacidosis or hyperglycemic hyperosmolar state). Severe hyperglycemia is a medical emergency. What are the causes? If you have diabetes, hyperglycemia may be caused by:  Diabetes medicine.  Medicines that increase blood glucose or affect your diabetes control.  Not eating enough, or not eating often enough.  Changes in physical activity level.  Being sick or having an infection. If you have prediabetes or undiagnosed diabetes:  Hyperglycemia may be caused by those conditions. If you do not have diabetes, hyperglycemia may be caused by:  Certain medicines, including steroid medicines, beta-blockers, epinephrine, and thiazide diuretics.  Stress.  Serious  illness.  Surgery.  Diseases of the pancreas.  Infection. What increases the risk? Hyperglycemia is more likely to develop in people who have risk factors for diabetes, such as:  Having a family member with diabetes.  Having a gene for type 1 diabetes that is passed from parent to child (inherited).  Living in an area with cold weather conditions.  Exposure to certain viruses.  Certain conditions in which the body's disease-fighting (immune) system attacks itself (autoimmune disorders).  Being overweight or obese.  Having an inactive (sedentary) lifestyle.  Having been diagnosed with insulin resistance.  Having a history of prediabetes, gestational diabetes, or polycystic ovarian syndrome (PCOS).  Being of American-Indian, African-American, Hispanic/Latino, or Asian/Pacific Islander descent. What are the signs or symptoms? Hyperglycemia may not cause any symptoms. If you do have symptoms, they may include early warning signs, such as:  Increased thirst.  Hunger.  Feeling very tired.  Needing to urinate more often than usual.  Blurry vision. Other symptoms may develop if hyperglycemia gets worse, such as:  Dry mouth.  Loss of appetite.  Fruity-smelling breath.  Weakness.  Unexpected or rapid weight gain or weight loss.  Tingling or numbness in the hands or feet.  Headache.  Skin that does not quickly return to normal after being lightly pinched and released (poor skin turgor).  Abdominal pain.  Cuts or bruises that are slow to heal. How is this diagnosed? Hyperglycemia is diagnosed with a blood test to measure your blood glucose level. This blood test is usually done while you are having symptoms. Your health care provider may also do a physical exam and review your medical history. You may have more tests to determine the cause of your hyperglycemia, such as:  A fasting blood glucose (FBG) test. You will not be allowed to eat (you will fast) for at  least 8 hours before a blood sample is taken.  An A1c (hemoglobin A1c) blood test. This provides information about blood glucose control over the previous 2-3 months.  An oral glucose tolerance test (OGTT). This measures your blood glucose at two times: ? After fasting. This is your baseline blood glucose level. ? Two hours after  drinking a beverage that contains glucose. How is this treated? Treatment depends on the cause of your hyperglycemia. Treatment may include:  Taking medicine to regulate your blood glucose levels. If you take insulin or other diabetes medicines, your medicine or dosage may be adjusted.  Lifestyle changes, such as exercising more, eating healthier foods, or losing weight.  Treating an illness or infection, if this caused your hyperglycemia.  Checking your blood glucose more often.  Stopping or reducing steroid medicines, if these caused your hyperglycemia. If your hyperglycemia becomes severe and it results in hyperglycemic hyperosmolar state, you must be hospitalized and given IV fluids. Follow these instructions at home:  General instructions  Take over-the-counter and prescription medicines only as told by your health care provider.  Do not use any products that contain nicotine or tobacco, such as cigarettes and e-cigarettes. If you need help quitting, ask your health care provider.  Limit alcohol intake to no more than 1 drink per day for nonpregnant women and 2 drinks per day for men. One drink equals 12 oz of beer, 5 oz of wine, or 1 oz of hard liquor.  Learn to manage stress. If you need help with this, ask your health care provider.  Keep all follow-up visits as told by your health care provider. This is important. Eating and drinking   Maintain a healthy weight.  Exercise regularly, as directed by your health care provider.  Stay hydrated, especially when you exercise, get sick, or spend time in hot temperatures.  Eat healthy foods, such  as: ? Lean proteins. ? Complex carbohydrates. ? Fresh fruits and vegetables. ? Low-fat dairy products. ? Healthy fats.  Drink enough fluid to keep your urine clear or pale yellow. If you have diabetes:  Make sure you know the symptoms of hyperglycemia.  Follow your diabetes management plan, as told by your health care provider. Make sure you: ? Take your insulin and medicines as directed. ? Follow your exercise plan. ? Follow your meal plan. Eat on time, and do not skip meals. ? Check your blood glucose as often as directed. Make sure to check your blood glucose before and after exercise. If you exercise longer or in a different way than usual, check your blood glucose more often. ? Follow your sick day plan whenever you cannot eat or drink normally. Make this plan in advance with your health care provider.  Share your diabetes management plan with people in your workplace, school, and household.  Check your urine for ketones when you are ill and as told by your health care provider.  Carry a medical alert card or wear medical alert jewelry. Contact a health care provider if:  Your blood glucose is at or above 240 mg/dL (13.3 mmol/L) for 2 days in a row.  You have problems keeping your blood glucose in your target range.  You have frequent episodes of hyperglycemia. Get help right away if:  You have difficulty breathing.  You have a change in how you think, feel, or act (mental status).  You have nausea or vomiting that does not go away. These symptoms may represent a serious problem that is an emergency. Do not wait to see if the symptoms will go away. Get medical help right away. Call your local emergency services (911 in the [place] Do not drive yourself to the hospital. Summary  Hyperglycemia occurs when the level of sugar (glucose) in the blood is too high.  Hyperglycemia is diagnosed with a blood test to  measure your blood glucose level. This blood test is usually done  while you are having symptoms. Your health care provider may also do a physical exam and review your medical history.  If you have diabetes, follow your diabetes management plan as told by your health care provider.  Contact your health care provider if you have problems keeping your blood glucose in your target range. This information is not intended to replace advice given to you by your health care provider. Make sure you discuss any questions you have with your health care provider. Document Revised: 02/16/2016 Document Reviewed: 02/16/2016 Elsevier Patient Education  2020 [facility] [phone]) 1. Dr. Philemon Kingdom 2. Dr. Elayne Snare 3. [place] **** [facility] [phone]) 1. Dr. Delrae Rend   **** [place] [phone]) 1. Dr. Jacelyn Pi   **** 2. Dr. Anda Kraft [facility] [phone]) 1. Dr. Daneil Dolin [facility] [phone]) [[place] office]  [phone]) [[place] office] 1. Dr. Lenna Sciara [place] 2. Dr. Mee Hives [facility]) [phone]) 1. Autumn Hudnall Ronnald Ramp), PA 2. Dr. Amalia Greenhouse 3. Dr. Marsh Dolly. [facility] [phone]) 1. Dr. Glade Lloyd [facility] [phone]) 1. Dr. Orville Govern 2. Dr. Lelon Huh 3. Dr. Jerelene Redden 4. Alwyn Ren, FNP Dr. Carolynn Serve. [facility] in [place] [phone])   Hemoglobin A1c Test Why am I having this test? You may have the hemoglobin A1c test (HbA1c test) done to:  Evaluate your risk for developing diabetes (diabetes mellitus).  Diagnose diabetes.  Monitor long-term control of blood sugar (glucose) in people who have diabetes and help make treatment decisions. This test may be done with other blood glucose tests, such as fasting blood glucose and oral glucose  tolerance tests. What is being tested? Hemoglobin is a type of protein in the blood that carries oxygen. Glucose attaches to hemoglobin to form glycated hemoglobin. This test checks the amount of glycated hemoglobin in your blood, which is a good indicator of the average amount of glucose in your blood during the past 2-3 months. What kind of sample is taken?  A blood sample is required for this test. It is usually collected by inserting a needle into a blood vessel. Tell a health care provider about:  All medicines you are taking, including vitamins, herbs, eye drops, creams, and over-the-counter medicines.  Any blood disorders you have.  Any surgeries you have had.  Any medical conditions you have.  Whether you are pregnant or may be pregnant. How are the results reported? Your results will be reported as a percentage that indicates how much of your hemoglobin has glucose attached to it (is glycated). Your health care provider will compare your results to normal ranges that were established after testing a large group of people (reference ranges). Reference ranges may vary among labs and hospitals. For this test, common reference ranges are:  Adult or child without diabetes: 4-5.6%.  Adult or child with diabetes and good blood glucose control: less than 7%. What do the results mean? If you have diabetes:  A result of less than 7% is considered normal, meaning that your blood glucose is well controlled.  A result higher than 7% means that your blood glucose is not well controlled, and your treatment plan may need to be adjusted. If you do not have diabetes:  A result within the reference range is considered normal, meaning that you are not at  high risk for diabetes.  A result of 5.7-6.4% means that you have a high risk of developing diabetes, and you may have prediabetes. Prediabetes is the condition of having a blood glucose level that is higher than it should be, but not high  enough for you to be diagnosed with diabetes. Having prediabetes puts you at risk for developing type 2 diabetes (type 2 diabetes mellitus). You may have more tests, including a repeat HbA1c test.  Results of 6.5% or higher on two separate HbA1c tests mean that you have diabetes. You may have more tests to confirm the diagnosis. Abnormally low HbA1c values may be caused by:  Pregnancy.  Severe blood loss.  Receiving donated blood (transfusions).  Low red blood cell count (anemia).  Long-term kidney failure.  Some unusual forms (variants) of hemoglobin. Talk with your health care provider about what your results mean. Questions to ask your health care provider Ask your health care provider, or the department that is doing the test:  When will my results be ready?  How will I get my results?  What are my treatment options?  What other tests do I need?  What are my next steps? Summary  The hemoglobin A1c test (HbA1c test) may be done to evaluate your risk for developing diabetes, to diagnose diabetes, and to monitor long-term control of blood sugar (glucose) in people who have diabetes and help make treatment decisions.  Hemoglobin is a type of protein in the blood that carries oxygen. Glucose attaches to hemoglobin to form glycated hemoglobin. This test checks the amount of glycated hemoglobin in your blood, which is a good indicator of the average amount of glucose in your blood during the past 2-3 months.  Talk with your health care provider about what your results mean. This information is not intended to replace advice given to you by your health care provider. Make sure you discuss any questions you have with your health care provider. Document Revised: 05/13/2017 Document Reviewed: 01/11/2017 Elsevier Patient Education  [place].

## 2020-01-30 NOTE — Progress Notes (Signed)
IV lasix PB started at 1430.  BP= 106/74 HR=92  Patient instructed to void in urinal each time and call RN at the end of each void for accurate output.

## 2020-01-30 NOTE — Progress Notes (Addendum)
Progress Note  Patient Name: [name] Date of Encounter: 01/30/2020  Primary Cardiologist: Larae Grooms, MD  Subjective   Had episode last night after receiving hydralazine where he became SOB, nauseated and dizzy. He ambulated to the bathroom and came back to the bed. When nurse returned with Zofran, he awoke to her tapping him. He's unsure if he passed out. Telemetry unrevealing during that time. SBP noted to be 95/74. Reassessment of BP 113/70 so hydralazine stopped. Brilinta also discontinued due to SOB and transitioned to Plavix.  Despite overnight events he feels much better this AM without CP, SOB or dizziness. He has remained persistently nauseated this admission. Still making urine, although he feels it's less than usual (150cc this AM).  Inpatient Medications    Scheduled Meds: . aspirin  81 mg Oral Daily  . atorvastatin  80 mg Oral Daily  . carvedilol  12.5 mg Oral BID WC  . Chlorhexidine Gluconate Cloth  6 each Topical Daily  . clopidogrel  600 mg Oral Once   Followed by  . [START ON 01/31/2020] clopidogrel  75 mg Oral Daily  . insulin aspart  0-15 Units Subcutaneous TID WC  . insulin aspart  0-5 Units Subcutaneous QHS  . insulin aspart  10 Units Subcutaneous TID WC  . insulin glargine  50 Units Subcutaneous QHS  . pantoprazole  40 mg Oral Daily  . sodium chloride flush  3 mL Intravenous Q12H   Continuous Infusions: . sodium chloride     PRN Meds: sodium chloride, acetaminophen, morphine, ondansetron (ZOFRAN) IV, sodium chloride flush   Vital Signs    Vitals:   01/29/20 2018 01/30/20 0011 01/30/20 0356 01/30/20 0802  BP: 106/78 128/90 135/86 128/83  Pulse:  91 93 95  Resp:  20 20 20   Temp: 98.7 F (37.1 C) 98.7 F (37.1 C) 98.4 F (36.9 C) 98.2 F (36.8 C)  TempSrc:  Oral Oral Oral  SpO2:  98% 98% 100%  Weight:   (!) 138.4 kg   Height:        Intake/Output Summary (Last 24 hours) at 01/30/2020 0944 Last data filed at 01/30/2020 0803 Gross  per 24 hour  Intake 360 ml  Output 200 ml  Net 160 ml   Last 3 Weights 01/30/2020 01/29/2020 01/28/2020  Weight (lbs) 305 lb 1.9 oz 304 lb 3.8 oz 300 lb  Weight (kg) 138.4 kg 138 kg 136.079 kg     Telemetry    NSR, QTc 420ms - Personally Reviewed  Physical Exam   GEN: No acute distress. Habitus c/w morbid obesity. HEENT: Normocephalic, atraumatic, sclera non-icteric. Neck: No JVD or bruits. Cardiac: RRR no murmurs, rubs, or gallops.  Radials/DP/PT 1+ and equal bilaterally.  Respiratory: Clear to auscultation bilaterally. Breathing is unlabored. GI: Soft, nontender, non-distended, BS +x 4. MS: no deformity. Extremities: No clubbing or cyanosis. No edema. Distal pedal pulses are 2+ and equal bilaterally. Right radial cath site without hematoma or ecchymosis; good pulse. Neuro:  AAOx3. Follows commands. Psych:  Responds to questions appropriately with a normal affect.  Labs    High Sensitivity Troponin:   Recent Labs  Lab 01/28/20 1242 01/28/20 1623  TROPONINIHS >27,000* >27,000*      Cardiac EnzymesNo results for input(s): TROPONINI in the last 168 hours. No results for input(s): TROPIPOC in the last 168 hours.   Chemistry Recent Labs  Lab 01/28/20 1242 01/28/20 1242 01/28/20 1315 01/29/20 0731 01/30/20 0830  NA 138   < > 140 133* 134*  K 3.6   < > 3.6 4.2 3.9  CL 103   < > 102 99 100  CO2 24  --   --  22 22  GLUCOSE 217*   < > 206* 412* 242*  BUN 19   < > 21* 30* 47*  CREATININE 2.28*   < > 2.00* 3.41* 6.22*  CALCIUM 9.0  --   --  8.5* 8.6*  PROT 6.6  --   --  6.1*  --   ALBUMIN 2.7*  --   --  2.3*  --   AST 113*  --   --  63*  --   ALT 45*  --   --  35  --   ALKPHOS 93  --   --  88  --   BILITOT 1.7*  --   --  1.7*  --   GFRNONAA 36*  --   --  22* 11*  GFRAA 41*  --   --  25* 12*  ANIONGAP 11  --   --  12 12   < > = values in this interval not displayed.     Hematology Recent Labs  Lab 01/28/20 1242 01/28/20 1315 01/29/20 0731  WBC 14.8*  --   17.5*  RBC 4.23  --  3.65*  HGB 12.0* 11.2* 10.5*  HCT 36.5* 33.0* 31.8*  MCV 86.3  --  87.1  MCH 28.4  --  28.8  MCHC 32.9  --  33.0  RDW 12.9  --  13.1  PLT 248  --  214    BNPNo results for input(s): BNP, PROBNP in the last 168 hours.   DDimer No results for input(s): DDIMER in the last 168 hours.   Radiology    CARDIAC CATHETERIZATION  Result Date: 01/28/2020  Prox LAD lesion is 100% stenosed. Late presenting anterior MI, as his symptoms have been going on for about 24 hours.  A drug-eluting stent was successfully placed using a SYNERGY XD 2.50X24, postdilated to greater than 3 mm in diameter.  Post intervention, there is a 0% residual stenosis.  There is moderate to severe left ventricular systolic dysfunction with anterior wall hypokinesis.  LV end diastolic pressure is mildly elevated.  The left ventricular ejection fraction is 25-35% by visual estimate.  There is no aortic valve stenosis.  Watch for 48 hours.  Check echocardiogram.  May need to consider LifeVest. Holding lisinopril at this time since his creatinine was elevated in the Cath Lab at the time of procedure. 1 year of dual antiplatelet therapy recommended along with aggressive secondary prevention.   DG CHEST PORT 1 VIEW  Result Date: 01/28/2020 CLINICAL DATA:  Shortness of breath. EXAM: PORTABLE CHEST 1 VIEW COMPARISON:  08/01/2019 FINDINGS: Heart size appears enlarged which may reflect artifact from portable technique. There is no pleural effusion identified. Opacities within the left base may represent atelectasis or pneumonia. Right lung appears clear. IMPRESSION: Left base opacity may represent atelectasis or pneumonia. Electronically Signed   By: Kerby Moors M.D.   On: 01/28/2020 18:51   ECHOCARDIOGRAM COMPLETE  Result Date: 01/29/2020    ECHOCARDIOGRAM REPORT   Patient Name:   [name] Date of Exam: 01/29/2020 Medical Rec #:  [id number]    Height:       76.0 in Accession #:    [id number]   Weight:        304.2 lb Date of Birth:  Jul 19, 1985    BSA:  2.648 m Patient Age:    34 years     BP:           129/114 mmHg Patient Gender: M            HR:           113 bpm. Exam Location:  Inpatient Procedure: 2D Echo, Cardiac Doppler and Color Doppler Indications:    Acute MI  History:        Patient has no prior history of Echocardiogram examinations.                 Acute MI and CAD; Risk Factors:Dyslipidemia, Diabetes,                 Hypertension and Obesity.  Sonographer:    Dustin Flock Referring Phys: [place]  1. Left ventricular ejection fraction, by estimation, is 30 to 35%. The left ventricle has moderately decreased function. The left ventricle demonstrates regional wall motion abnormalities (see scoring diagram/findings for description). Anterior/septal akinesis. There is moderate left ventricular hypertrophy. Left ventricular diastolic parameters are indeterminate.  2. Right ventricular systolic function is normal. The right ventricular size is normal.  3. The mitral valve is normal in structure. No evidence of mitral valve regurgitation.  4. The aortic valve is tricuspid. Aortic valve regurgitation is not visualized. No aortic stenosis is present.  5. Small pericardial effusion, measures up to 0.5cm adjacent to RV free wall at end diastole. No RV diastolic collapse seen.  6. The inferior vena cava is normal in size with <50% respiratory variability, suggesting right atrial pressure of 8 mmHg. FINDINGS  Left Ventricle: Left ventricular ejection fraction, by estimation, is 30 to 35%. The left ventricle has moderately decreased function. The left ventricle demonstrates regional wall motion abnormalities. The left ventricular internal cavity size was normal in size. There is moderate left ventricular hypertrophy. Left ventricular diastolic parameters are indeterminate.  LV Wall Scoring: The mid and distal anterior wall, mid and distal anterior septum, mid inferoseptal  segment, and apex are akinetic. The basal anteroseptal segment, basal anterior segment, and basal inferoseptal segment are hypokinetic. The entire lateral wall and entire inferior wall are normal. Right Ventricle: The right ventricular size is normal. Right vetricular wall thickness was not assessed. Right ventricular systolic function is normal. Left Atrium: Left atrial size was normal in size. Right Atrium: Right atrial size was normal in size. Pericardium: A small pericardial effusion is present. Mitral Valve: The mitral valve is normal in structure. No evidence of mitral valve regurgitation. Tricuspid Valve: The tricuspid valve is normal in structure. Tricuspid valve regurgitation is trivial. Aortic Valve: The aortic valve is tricuspid. Aortic valve regurgitation is not visualized. No aortic stenosis is present. Pulmonic Valve: The pulmonic valve was not well visualized. Pulmonic valve regurgitation is not visualized. Aorta: The aortic root is normal in size and structure. Venous: The inferior vena cava is normal in size with less than 50% respiratory variability, suggesting right atrial pressure of 8 mmHg. IAS/Shunts: The interatrial septum was not well visualized.  LEFT VENTRICLE PLAX 2D LVIDd:         4.80 cm  Diastology LVIDs:         3.70 cm  LV e' lateral:   6.85 cm/s LV PW:         1.30 cm  LV E/e' lateral: 10.6 LV IVS:        1.40 cm  LV e' medial:    7.29 cm/s LVOT  diam:     2.20 cm  LV E/e' medial:  10.0 LV SV:         41 LV SV Index:   16 LVOT Area:     3.80 cm  RIGHT VENTRICLE RV Basal diam:  3.30 cm RV S prime:     13.50 cm/s TAPSE (M-mode): 2.2 cm LEFT ATRIUM             Index       RIGHT ATRIUM           Index LA diam:        4.00 cm 1.51 cm/m  RA Area:     12.30 cm LA Vol (A2C):   55.4 ml 20.93 ml/m RA Volume:   28.40 ml  10.73 ml/m LA Vol (A4C):   71.0 ml 26.82 ml/m LA Biplane Vol: 66.5 ml 25.12 ml/m  AORTIC VALVE LVOT Vmax:   69.20 cm/s LVOT Vmean:  48.500 cm/s LVOT VTI:    0.109 m   AORTA Ao Root diam: 2.90 cm MITRAL VALVE MV Area (PHT): 15.17 cm   SHUNTS MV Decel Time: 50 msec     Systemic VTI:  0.11 m MV E velocity: 72.80 cm/s  Systemic Diam: 2.20 cm MV A velocity: 71.10 cm/s MV E/A ratio:  1.02 Oswaldo Milian MD Electronically signed by Oswaldo Milian MD Signature Date/Time: 01/29/2020/10:46:34 AM    Final     Cardiac Studies   2D echo 01/29/20 1. Left ventricular ejection fraction, by estimation, is 30 to 35%. The  left ventricle has moderately decreased function. The left ventricle  demonstrates regional wall motion abnormalities (see scoring  diagram/findings for description). Anterior/septal  akinesis. There is moderate left ventricular hypertrophy. Left ventricular  diastolic parameters are indeterminate.  2. Right ventricular systolic function is normal. The right ventricular  size is normal.  3. The mitral valve is normal in structure. No evidence of mitral valve  regurgitation.  4. The aortic valve is tricuspid. Aortic valve regurgitation is not  visualized. No aortic stenosis is present.  5. Small pericardial effusion, measures up to 0.5cm adjacent to RV free  wall at end diastole. No RV diastolic collapse seen.  6. The inferior vena cava is normal in size with <50% respiratory  variability, suggesting right atrial pressure of 8 mmHg.   LHC 01/28/20 Prox LAD lesion is 100% stenosed. Late presenting anterior MI, as his symptoms have been going on for about 24 hours.  A drug-eluting stent was successfully placed using a SYNERGY XD 2.50X24, postdilated to greater than 3 mm in diameter.  Post intervention, there is a 0% residual stenosis.  There is moderate to severe left ventricular systolic dysfunction with anterior wall hypokinesis.  LV end diastolic pressure is mildly elevated.  The left ventricular ejection fraction is 25-35% by visual estimate.  There is no aortic valve stenosis.  Watch for 48 hours.  Check echocardiogram.  May  need to consider LifeVest. Holding lisinopril at this time since his creatinine was elevated in the Cath Lab at the time of procedure. 1 year of dual antiplatelet therapy recommended along with aggressive secondary prevention  Patient Profile     34 y.o. male with obesity, IDDM, HTN, HLD, family history of CAD began having chest pain 1 day prior to admission. Due to persistent pain, came to the hospital where EKG demonstrated anterior Q waves and marked ST elevation. Cath showed 100% prox LAD (late presenting MI), s/p DES, LVEF 25-35%, mildly elevated LVEDP. 2D echo showed  EF 30-35% with anterior/septal akinesis, normal RV function, small pericardial effusion. Also found to have AKI, uncontrolled DM, severe HLD.  Assessment & Plan    1. 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 Essential HTN - BP controlled, hydralazine stopped due to hypotension overnight - avoid hypotension in context of #3 - baseline TSH in AM  5. Hyperlipidemia goal LDL <70 - TChol 290, HDL 45, LDL 176, trig 344 - now on high dose atorvastatin - If the patient is tolerating statin at time of follow-up appointment, would consider rechecking liver function/lipid panel in 6 weeks  6. Insulin  dependent diabetes mellitus - continue Lantus - per DM coordinator will also increase meal coverage to 10u TID - continue SSI (moderate scale)  7. Pericardial effusion - small by initial echo - with last night's events recheck echo  8. Leukocytosis/anemia - f/u labs today  For questions or updates, please contact [place] Please consult [email or web address] for contact info under Cardiology/STEMI.  Signed, Charlie Pitter, PA-C 01/30/2020, 9:44 AM    Patient seen, examined. Available data reviewed. Agree with findings, assessment, and plan as outlined by Melina Copa, PA-C.  The patient is independently interviewed and examined.  He looks great today.  He feels well and has no specific complaints.  Events overnight noted when he became lightheaded and dizzy after receiving hydralazine and had associated hypotension.  This is been discontinued.  On my exam, he is alert, oriented, in no distress.  Lungs are clear, heart is regular rate and rhythm with no murmur gallop, abdomen soft nontender, right radial site is clear with mild hematoma in the forearm but this is soft.  Lower extremities have no edema.  Plan and recommendations as outlined above.  I have reviewed the results of the patient's limited echo this morning and he does not have a significant pericardial effusion at this time.  Unfortunately his creatinine continues to increase now to 6.22 mg/dL.  Nephrology has been consulted.  Appreciate their evaluation.  Patient is at high risk for requiring at least short-term hemodialysis.  Repeat creatinine planned for tomorrow morning.  Avoid diuretics today as he does not have overt volume overload.  Plan discussed with the patient and his wife who is at the bedside.  Sherren Mocha, M.D. 01/30/2020 11:34 AM

## 2020-01-30 NOTE — Consult Note (Signed)
[name] Consult Note     Date: 01/30/2020                  Patient Name:  [name]  MRN: [id number]  DOB: 01/31/1986  Age / Sex: 34 y.o., male         PCP: [name], FNP                 Service Requesting Consult: Cardiology                  Reason for Consult: AKI            Chief Complaint: Chest pain HPI:   [name] is a 34 y.o. male with a pPMH of CAD, DM, HLD, HTN, who presented to [facility] on 08/16 with acute onset chest pain and diaphoresis. The chest pain started the day prior to presentation. He states that the pain was persistent but waxed and waned. He had associated nausea  IN the ED, he was found to have ischemic changes on his EKG and was sent for cardiac catheterization.  He was found to have a 100% stenosis of his proximal LAD with significant anterior wall hypokinesis (EF of 25-35%) and is now s/p DES placement. Patient's Cr on presentation was 2.28 (GFR 41) w/o significant electrolyte abnormalities.   Subsequently he underwent TTE on the following day with minimal improvement of LV function (30-35%). He was stared on [place] and ACEI/ARB/Entresto/Aldosterone antagonists were held due to worsening Cr of 3.41 with 1 L urine output recorded. Patient had an episode of hypotension with associated n/v and syncope. Telemetry did not show any arrhythmias.   On evaluation today, patient states that he is feeling better today. He states that his beathing and energy have improved but does admits to some exertional dyspnea. He also admits to decreased urine output in the last 24 hours without associated dysuria or hematuria. His kidney function has significantly worsened since admission and is now at a Cr of 6.22 (GFR 12) without significant electrolyte abnormalities or uremic symptoms. Specifically he denies, change in appetite or taste, pruritis, nausea, or confusion.   Past Medical History:  Diagnosis Date  . Coronary artery disease   . Diabetes  mellitus without complication ([place])   . Hypercholesteremia   . Hypertension   . Myocardial infarction [facility]) 01/2020    Past Surgical History:  Procedure Laterality Date  . CORONARY STENT INTERVENTION N/A 01/28/2020   Procedure: CORONARY STENT INTERVENTION;  Surgeon: [name], MD;  Location: [place] [facility];  Service: Cardiovascular;  Laterality: N/A;  . CORONARY/GRAFT ACUTE MI REVASCULARIZATION N/A 01/28/2020   Procedure: Coronary/Graft Acute MI Revascularization;  Surgeon: [name], MD;  Location: [place] [facility];  Service: Cardiovascular;  Laterality: N/A;  . LEFT HEART CATH AND CORONARY ANGIOGRAPHY N/A 01/28/2020   Procedure: LEFT HEART CATH AND CORONARY ANGIOGRAPHY;  Surgeon: [name], MD;  Location: [place] [facility];  Service: Cardiovascular;  Laterality: N/A;    Family History  Problem Relation Age of Onset  . Diabetes Father    Social History:  reports that he has never smoked. He has never used smokeless tobacco. He reports that he does not drink alcohol and does not use drugs.  Allergies: No Known Allergies  Medications Prior to Admission  Medication Sig Dispense Refill  . ACCU-CHEK GUIDE test strip 1 each by Other route 3 (three) times daily.   3  . aspirin 81  MG tablet Take 1 tablet (81 mg total) by mouth daily. 30 tablet 0  . atorvastatin (LIPITOR) 10 MG tablet Take 1 tablet (10 mg total) by mouth daily. 30 tablet 0  . LANTUS SOLOSTAR 100 UNIT/ML Solostar Pen Inject 45-50 Units into the skin at bedtime.     Marland Kitchen lisinopril (PRINIVIL,ZESTRIL) 20 MG tablet Take 1 tablet (20 mg total) by mouth daily. 30 tablet 2  . NOVOLOG FLEXPEN 100 UNIT/ML FlexPen Inject 5-10 Units into the skin See admin instructions. Inject 5-10 units into the skin three times a day before meals, per sliding scale  0  . omeprazole (PRILOSEC) 20 MG capsule Take 1 capsule (20 mg total) by mouth daily. 30 capsule 0    Results for orders placed or performed during the  hospital encounter of 01/28/20 (from the past 48 hour(s))  SARS Coronavirus 2 by RT PCR (hospital order, performed in [facility] hospital lab) Nasopharyngeal Nasopharyngeal Swab     Status: None   Collection Time: 01/28/20 12:38 PM   Specimen: Nasopharyngeal Swab  Result Value Ref Range   SARS Coronavirus 2 NEGATIVE NEGATIVE    Comment: (NOTE) SARS-CoV-2 target nucleic acids are NOT DETECTED.  The SARS-CoV-2 RNA is generally detectable in upper and lower respiratory specimens during the acute phase of infection. The lowest concentration of SARS-CoV-2 viral copies this assay can detect is 250 copies / mL. A negative result does not preclude SARS-CoV-2 infection and should not be used as the sole basis for treatment or other patient management decisions.  A negative result may occur with improper specimen collection / handling, submission of specimen other than nasopharyngeal swab, presence of viral mutation(s) within the areas targeted by this assay, and inadequate number of viral copies (<250 copies / mL). A negative result must be combined with clinical observations, patient history, and epidemiological information.  Fact Sheet for Patients:   StrictlyIdeas.no  Fact Sheet for Healthcare Providers: BankingDealers.co.za  This test is not yet approved or  cleared by the [place] FDA and has been authorized for detection and/or diagnosis of SARS-CoV-2 by FDA under an Emergency Use Authorization (EUA).  This EUA will remain in effect (meaning this test can be used) for the duration of the COVID-19 declaration under Section 564(b)(1) of the Act, 21 U.S.C. section 360bbb-3(b)(1), unless the authorization is terminated or revoked sooner.  Performed at [facility], [place] [address] [place], [place] [address]   Hemoglobin A1c     Status: Abnormal   Collection Time: 01/28/20 12:42 PM  Result Value Ref Range   Hgb A1c MFr Bld  10.1 (H) 4.8 - 5.6 %    Comment: (NOTE) Pre diabetes:          5.7%-6.4%  Diabetes:              >6.4%  Glycemic control for   <7.0% adults with diabetes    Mean Plasma Glucose 243.17 mg/dL    Comment: Performed at [facility] [address], [place], [place] [address]  CBC with Differential/Platelet     Status: Abnormal   Collection Time: 01/28/20 12:42 PM  Result Value Ref Range   WBC 14.8 (H) 4.0 - 10.5 K/uL   RBC 4.23 4.22 - 5.81 MIL/uL   Hemoglobin 12.0 (L) 13.0 - 17.0 g/dL   HCT 36.5 (L) 39 - 52 %   MCV 86.3 80.0 - 100.0 fL   MCH 28.4 26.0 - 34.0 pg   MCHC 32.9 30.0 - 36.0  g/dL   RDW 12.9 11.5 - 15.5 %   Platelets 248 150 - 400 K/uL   nRBC 0.0 0.0 - 0.2 %   Neutrophils Relative % 78 %   Neutro Abs 11.6 (H) 1.7 - 7.7 K/uL   Lymphocytes Relative 11 %   Lymphs Abs 1.6 0.7 - 4.0 K/uL   Monocytes Relative 10 %   Monocytes Absolute 1.5 (H) 0 - 1 K/uL   Eosinophils Relative 0 %   Eosinophils Absolute 0.0 0 - 0 K/uL   Basophils Relative 0 %   Basophils Absolute 0.0 0 - 0 K/uL   Immature Granulocytes 1 %   Abs Immature Granulocytes 0.07 0.00 - 0.07 K/uL    Comment: Performed at [facility] [address] [place], [place] [address]  Protime-INR     Status: None   Collection Time: 01/28/20 12:42 PM  Result Value Ref Range   Prothrombin Time 14.2 11.4 - 15.2 seconds   INR 1.1 0.8 - 1.2    Comment: (NOTE) INR goal varies based on device and disease states. Performed at [facility], [place] [address] [place], [place] [address]   APTT     Status: None   Collection Time: 01/28/20 12:42 PM  Result Value Ref Range   aPTT 30 24 - 36 seconds    Comment: Performed at [facility] [address], [place], [place] [address]  Comprehensive metabolic panel     Status: Abnormal   Collection Time: 01/28/20 12:42 PM  Result Value Ref Range   Sodium 138 135 - 145 mmol/L   Potassium 3.6 3.5 - 5.1 mmol/L   Chloride 103 98 - 111 mmol/L   CO2 24 22 - 32  mmol/L   Glucose, Bld 217 (H) 70 - 99 mg/dL    Comment: Glucose reference range applies only to samples taken after fasting for at least 8 hours.   BUN 19 6 - 20 mg/dL   Creatinine, Ser 2.28 (H) 0.61 - 1.24 mg/dL   Calcium 9.0 8.9 - 10.3 mg/dL   Total Protein 6.6 6.5 - 8.1 g/dL   Albumin 2.7 (L) 3.5 - 5.0 g/dL   AST 113 (H) 15 - 41 U/L   ALT 45 (H) 0 - 44 U/L   Alkaline Phosphatase 93 38 - 126 U/L   Total Bilirubin 1.7 (H) 0.3 - 1.2 mg/dL   GFR calc non Af Amer 36 (L) >60 mL/min   GFR calc Af Amer 41 (L) >60 mL/min   Anion gap 11 5 - 15    Comment: Performed at [facility] [address] [place], [place] [address]  Troponin I (High Sensitivity)     Status: Abnormal   Collection Time: 01/28/20 12:42 PM  Result Value Ref Range   Troponin I (High Sensitivity) >27,000 (HH) <18 ng/L    Comment: RESULT CONFIRMED BY AUTOMATED DILUTION CRITICAL RESULT CALLED TO, READ BACK BY AND VERIFIED WITH: RN B ROBINSON AT 1417 01/28/20 BY L BENFIELD Performed at [facility], [place] [address], [place],  [address]   Lipid panel     Status: Abnormal   Collection Time: 01/28/20 12:42 PM  Result Value Ref Range   Cholesterol 308 (H) 0 - 200 mg/dL   Triglycerides 445 (H) <150 mg/dL   HDL 49 >40 mg/dL   Total CHOL/HDL Ratio 6.3 RATIO   VLDL UNABLE TO CALCULATE IF TRIGLYCERIDE OVER 400 mg/dL 0 - 40 mg/dL   LDL Cholesterol  UNABLE TO CALCULATE IF TRIGLYCERIDE OVER 400 mg/dL 0 - 99 mg/dL    Comment:        Total Cholesterol/HDL:CHD Risk Coronary Heart Disease Risk Table                     Men   Women  1/2 Average Risk   3.4   3.3  Average Risk       5.0   4.4  2 X Average Risk   9.6   7.1  3 X Average Risk  23.4   11.0        Use the calculated Patient Ratio above and the CHD Risk Table to determine the patient's CHD Risk.        ATP III CLASSIFICATION (LDL):  <100     mg/dL   Optimal  100-129  mg/dL   Near or Above                    Optimal  130-159  mg/dL   Borderline   160-189  mg/dL   High  >190     mg/dL   Very High Performed at [facility] [address], [place], [place] [address]   LDL cholesterol, direct     Status: Abnormal   Collection Time: 01/28/20 12:42 PM  Result Value Ref Range   Direct LDL 118.2 (H) 0 - 99 mg/dL    Comment: Performed at [facility] [address] [place], [place] [address]  I-STAT, [place] Cl[place] 8     Status: Abnormal   Collection Time: 01/28/20  1:15 PM  Result Value Ref Range   Sodium 140 135 - 145 mmol/L   Potassium 3.6 3.5 - 5.1 mmol/L   Chloride 102 98 - 111 mmol/L   BUN 21 (H) 6 - 20 mg/dL   Creatinine, Ser 2.00 (H) 0.61 - 1.24 mg/dL   Glucose, Bld 206 (H) 70 - 99 mg/dL    Comment: Glucose reference range applies only to samples taken after fasting for at least 8 hours.   Calcium, Ion 1.22 1.15 - 1.40 mmol/L   TCO2 23 22 - 32 mmol/L   Hemoglobin 11.2 (L) 13.0 - 17.0 g/dL   HCT 33.0 (L) 39 - 52 %  POCT Activated clotting time     Status: None   Collection Time: 01/28/20  1:24 PM  Result Value Ref Range   Activated Clotting Time 318 seconds  Glucose, capillary     Status: Abnormal   Collection Time: 01/28/20  2:40 PM  Result Value Ref Range   Glucose-Capillary 190 (H) 70 - 99 mg/dL    Comment: Glucose reference range applies only to samples taken after fasting for at least 8 hours.  Troponin I (High Sensitivity)     Status: Abnormal   Collection Time: 01/28/20  4:23 PM  Result Value Ref Range   Troponin I (High Sensitivity) >27,000 (HH) <18 ng/L    Comment: RESULT CONFIRMED BY AUTOMATED DILUTION CRITICAL VALUE NOTED.  VALUE IS CONSISTENT WITH PREVIOUSLY REPORTED AND CALLED VALUE. (NOTE) Elevated high sensitivity troponin I (hsTnI) values and significant  changes across serial measurements may suggest ACS but many other  chronic and acute conditions are known to elevate hsTnI results.  Refer to the Links section for chest pain algorithms and additional  guidance. Performed at [facility], [place] [address], [place],  [address]   MRSA PCR Screening     Status: None  Collection Time: 01/28/20  4:23 PM   Specimen: Nasopharyngeal  Result Value Ref Range   MRSA by PCR NEGATIVE NEGATIVE    Comment:        The GeneXpert MRSA Assay (FDA approved for NASAL specimens only), is one component of a comprehensive MRSA colonization surveillance program. It is not intended to diagnose MRSA infection nor to guide or monitor treatment for MRSA infections. Performed at [facility], [place] [address], [place], [place] [address]   Glucose, capillary     Status: Abnormal   Collection Time: 01/28/20  5:05 PM  Result Value Ref Range   Glucose-Capillary 206 (H) 70 - 99 mg/dL    Comment: Glucose reference range applies only to samples taken after fasting for at least 8 hours.  Glucose, capillary     Status: Abnormal   Collection Time: 01/28/20  9:44 PM  Result Value Ref Range   Glucose-Capillary 296 (H) 70 - 99 mg/dL    Comment: Glucose reference range applies only to samples taken after fasting for at least 8 hours.  Glucose, capillary     Status: Abnormal   Collection Time: 01/29/20  6:34 AM  Result Value Ref Range   Glucose-Capillary 377 (H) 70 - 99 mg/dL    Comment: Glucose reference range applies only to samples taken after fasting for at least 8 hours.  Basic metabolic panel     Status: Abnormal   Collection Time: 01/29/20  7:31 AM  Result Value Ref Range   Sodium 133 (L) 135 - 145 mmol/L   Potassium 4.2 3.5 - 5.1 mmol/L   Chloride 99 98 - 111 mmol/L   CO2 22 22 - 32 mmol/L   Glucose, Bld 412 (H) 70 - 99 mg/dL    Comment: Glucose reference range applies only to samples taken after fasting for at least 8 hours.   BUN 30 (H) 6 - 20 mg/dL   Creatinine, Ser 3.41 (H) 0.61 - 1.24 mg/dL    Comment: DELTA CHECK NOTED   Calcium 8.5 (L) 8.9 - 10.3 mg/dL   GFR calc non Af Amer 22 (L) >60 mL/min   GFR calc Af Amer 25 (L) >60 mL/min   Anion gap 12 5 - 15    Comment:  Performed at [facility] [address] [place], [place] [address]  CBC     Status: Abnormal   Collection Time: 01/29/20  7:31 AM  Result Value Ref Range   WBC 17.5 (H) 4.0 - 10.5 K/uL   RBC 3.65 (L) 4.22 - 5.81 MIL/uL   Hemoglobin 10.5 (L) 13.0 - 17.0 g/dL   HCT 31.8 (L) 39 - 52 %   MCV 87.1 80.0 - 100.0 fL   MCH 28.8 26.0 - 34.0 pg   MCHC 33.0 30.0 - 36.0 g/dL   RDW 13.1 11.5 - 15.5 %   Platelets 214 150 - 400 K/uL   nRBC 0.0 0.0 - 0.2 %    Comment: Performed at [facility], [place] [address], [place], [place] [address]  Hepatic function panel     Status: Abnormal   Collection Time: 01/29/20  7:31 AM  Result Value Ref Range   Total Protein 6.1 (L) 6.5 - 8.1 g/dL   Albumin 2.3 (L) 3.5 - 5.0 g/dL   AST 63 (H) 15 - 41 U/L   ALT 35 0 - 44 U/L   Alkaline Phosphatase 88 38 - 126 U/L   Total Bilirubin 1.7 (H) 0.3 - 1.2  mg/dL   Bilirubin, Direct 0.2 0.0 - 0.2 mg/dL   Indirect Bilirubin 1.5 (H) 0.3 - 0.9 mg/dL    Comment: Performed at [facility] [address], [place], [place] [address]  Lipid panel     Status: Abnormal   Collection Time: 01/29/20  7:31 AM  Result Value Ref Range   Cholesterol 290 (H) 0 - 200 mg/dL   Triglycerides 344 (H) <150 mg/dL   HDL 45 >40 mg/dL   Total CHOL/HDL Ratio 6.4 RATIO   VLDL 69 (H) 0 - 40 mg/dL   LDL Cholesterol 176 (H) 0 - 99 mg/dL    Comment:        Total Cholesterol/HDL:CHD Risk Coronary Heart Disease Risk Table                     Men   Women  1/2 Average Risk   3.4   3.3  Average Risk       5.0   4.4  2 X Average Risk   9.6   7.1  3 X Average Risk  23.4   11.0        Use the calculated Patient Ratio above and the CHD Risk Table to determine the patient's CHD Risk.        ATP III CLASSIFICATION (LDL):  <100     mg/dL   Optimal  100-129  mg/dL   Near or Above                    Optimal  130-159  mg/dL   Borderline  160-189  mg/dL   High  >190     mg/dL   Very High Performed at [facility]  [address] [place], [place] [address]   Glucose, capillary     Status: Abnormal   Collection Time: 01/29/20 11:39 AM  Result Value Ref Range   Glucose-Capillary 377 (H) 70 - 99 mg/dL    Comment: Glucose reference range applies only to samples taken after fasting for at least 8 hours.  Glucose, capillary     Status: Abnormal   Collection Time: 01/29/20  4:12 PM  Result Value Ref Range   Glucose-Capillary 333 (H) 70 - 99 mg/dL    Comment: Glucose reference range applies only to samples taken after fasting for at least 8 hours.  Glucose, capillary     Status: Abnormal   Collection Time: 01/29/20  8:26 PM  Result Value Ref Range   Glucose-Capillary 235 (H) 70 - 99 mg/dL    Comment: Glucose reference range applies only to samples taken after fasting for at least 8 hours.  Glucose, capillary     Status: Abnormal   Collection Time: 01/30/20  8:00 AM  Result Value Ref Range   Glucose-Capillary 224 (H) 70 - 99 mg/dL    Comment: Glucose reference range applies only to samples taken after fasting for at least 8 hours.  Basic metabolic panel     Status: Abnormal   Collection Time: 01/30/20  8:30 AM  Result Value Ref Range   Sodium 134 (L) 135 - 145 mmol/L   Potassium 3.9 3.5 - 5.1 mmol/L   Chloride 100 98 - 111 mmol/L   CO2 22 22 - 32 mmol/L   Glucose, Bld 242 (H) 70 - 99 mg/dL    Comment: Glucose reference range applies only to samples taken after fasting for at least 8 hours.   BUN 47 (H) 6 - 20  mg/dL   Creatinine, Ser 6.22 (H) 0.61 - 1.24 mg/dL    Comment: DELTA CHECK NOTED   Calcium 8.6 (L) 8.9 - 10.3 mg/dL   GFR calc non Af Amer 11 (L) >60 mL/min   GFR calc Af Amer 12 (L) >60 mL/min   Anion gap 12 5 - 15    Comment: Performed at [facility] [address], [place], [place] [address]   CARDIAC CATHETERIZATION  Result Date: 01/28/2020  Prox LAD lesion is 100% stenosed. Late presenting anterior MI, as his symptoms have been going on for about 24 hours.  A drug-eluting stent was  successfully placed using a SYNERGY XD 2.50X24, postdilated to greater than 3 mm in diameter.  Post intervention, there is a 0% residual stenosis.  There is moderate to severe left ventricular systolic dysfunction with anterior wall hypokinesis.  LV end diastolic pressure is mildly elevated.  The left ventricular ejection fraction is 25-35% by visual estimate.  There is no aortic valve stenosis.  Watch for 48 hours.  Check echocardiogram.  May need to consider LifeVest. Holding lisinopril at this time since his creatinine was elevated in the Cath Lab at the time of procedure. 1 year of dual antiplatelet therapy recommended along with aggressive secondary prevention.   DG CHEST PORT 1 VIEW  Result Date: 01/28/2020 CLINICAL DATA:  Shortness of breath. EXAM: PORTABLE CHEST 1 VIEW COMPARISON:  08/01/2019 FINDINGS: Heart size appears enlarged which may reflect artifact from portable technique. There is no pleural effusion identified. Opacities within the left base may represent atelectasis or pneumonia. Right lung appears clear. IMPRESSION: Left base opacity may represent atelectasis or pneumonia. Electronically Signed   By: Kerby Moors M.D.   On: 01/28/2020 18:51   ECHOCARDIOGRAM COMPLETE  Result Date: 01/29/2020    ECHOCARDIOGRAM REPORT   Patient Name:   [name] Date of Exam: 01/29/2020 Medical Rec #:  [id number]    Height:       76.0 in Accession #:    [id number]   Weight:       304.2 lb Date of Birth:  May 11, 1986    BSA:          2.648 m Patient Age:    19 years     BP:           129/114 mmHg Patient Gender: M            HR:           113 bpm. Exam Location:  Inpatient Procedure: 2D Echo, Cardiac Doppler and Color Doppler Indications:    Acute MI  History:        Patient has no prior history of Echocardiogram examinations.                 Acute MI and CAD; Risk Factors:Dyslipidemia, Diabetes,                 Hypertension and Obesity.  Sonographer:    Dustin Flock Referring Phys: [place]  1. Left ventricular ejection fraction, by estimation, is 30 to 35%. The left ventricle has moderately decreased function. The left ventricle demonstrates regional wall motion abnormalities (see scoring diagram/findings for description). Anterior/septal akinesis. There is moderate left ventricular hypertrophy. Left ventricular diastolic parameters are indeterminate.  2. Right ventricular systolic function is normal. The right ventricular size is normal.  3. The mitral valve is normal in structure. No evidence of mitral valve regurgitation.  4. The aortic  valve is tricuspid. Aortic valve regurgitation is not visualized. No aortic stenosis is present.  5. Small pericardial effusion, measures up to 0.5cm adjacent to RV free wall at end diastole. No RV diastolic collapse seen.  6. The inferior vena cava is normal in size with <50% respiratory variability, suggesting right atrial pressure of 8 mmHg. FINDINGS  Left Ventricle: Left ventricular ejection fraction, by estimation, is 30 to 35%. The left ventricle has moderately decreased function. The left ventricle demonstrates regional wall motion abnormalities. The left ventricular internal cavity size was normal in size. There is moderate left ventricular hypertrophy. Left ventricular diastolic parameters are indeterminate.  LV Wall Scoring: The mid and distal anterior wall, mid and distal anterior septum, mid inferoseptal segment, and apex are akinetic. The basal anteroseptal segment, basal anterior segment, and basal inferoseptal segment are hypokinetic. The entire lateral wall and entire inferior wall are normal. Right Ventricle: The right ventricular size is normal. Right vetricular wall thickness was not assessed. Right ventricular systolic function is normal. Left Atrium: Left atrial size was normal in size. Right Atrium: Right atrial size was normal in size. Pericardium: A small pericardial effusion is present. Mitral Valve: The mitral valve  is normal in structure. No evidence of mitral valve regurgitation. Tricuspid Valve: The tricuspid valve is normal in structure. Tricuspid valve regurgitation is trivial. Aortic Valve: The aortic valve is tricuspid. Aortic valve regurgitation is not visualized. No aortic stenosis is present. Pulmonic Valve: The pulmonic valve was not well visualized. Pulmonic valve regurgitation is not visualized. Aorta: The aortic root is normal in size and structure. Venous: The inferior vena cava is normal in size with less than 50% respiratory variability, suggesting right atrial pressure of 8 mmHg. IAS/Shunts: The interatrial septum was not well visualized.  LEFT VENTRICLE PLAX 2D LVIDd:         4.80 cm  Diastology LVIDs:         3.70 cm  LV e' lateral:   6.85 cm/s LV PW:         1.30 cm  LV E/e' lateral: 10.6 LV IVS:        1.40 cm  LV e' medial:    7.29 cm/s LVOT diam:     2.20 cm  LV E/e' medial:  10.0 LV SV:         41 LV SV Index:   16 LVOT Area:     3.80 cm  RIGHT VENTRICLE RV Basal diam:  3.30 cm RV S prime:     13.50 cm/s TAPSE (M-mode): 2.2 cm LEFT ATRIUM             Index       RIGHT ATRIUM           Index LA diam:        4.00 cm 1.51 cm/m  RA Area:     12.30 cm LA Vol (A2C):   55.4 ml 20.93 ml/m RA Volume:   28.40 ml  10.73 ml/m LA Vol (A4C):   71.0 ml 26.82 ml/m LA Biplane Vol: 66.5 ml 25.12 ml/m  AORTIC VALVE LVOT Vmax:   69.20 cm/s LVOT Vmean:  48.500 cm/s LVOT VTI:    0.109 m  AORTA Ao Root diam: 2.90 cm MITRAL VALVE MV Area (PHT): 15.17 cm   SHUNTS MV Decel Time: 50 msec     Systemic VTI:  0.11 m MV E velocity: 72.80 cm/s  Systemic Diam: 2.20 cm MV A velocity: 71.10 cm/s MV E/A ratio:  1.02 [place]rell Gave  Gardiner Rhyme MD Electronically signed by Oswaldo Milian MD Signature Date/Time: 01/29/2020/10:46:34 AM    Final     ROS - were negative except what was noted on the HPI.  Blood pressure 128/83, pulse 95, temperature 98.2 F (36.8 C), temperature source Oral, resp. rate 20, height 6\' 4"  (1.93 m),  weight (!) 138.4 kg, SpO2 100 %. Physical Exam Constitutional:      General: He is not in acute distress.    Appearance: He is obese. He is not diaphoretic.  HENT:     Head: Normocephalic and atraumatic.  Eyes:     Extraocular Movements: Extraocular movements intact.  Cardiovascular:     Rate and Rhythm: Normal rate.     Heart sounds: Normal heart sounds.  Pulmonary:     Effort: Pulmonary effort is normal. No respiratory distress.     Breath sounds: Normal breath sounds.  Chest:     Chest wall: No tenderness.  Abdominal:     General: Bowel sounds are normal. There is no abdominal bruit.  Musculoskeletal:        General: Normal range of motion.     Right lower leg: No edema.     Left lower leg: No edema.  Skin:    General: Skin is warm and dry.  Neurological:     General: No focal deficit present.     Mental Status: He is alert.  Psychiatric:        Mood and Affect: Mood normal.      Assessment/Plan:  1. Acute on chronic kidney disease - Patient has progressively worsening kidney function likely secondary to multifactorial etiology. 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 Patient is not s/p PCI with DES to his LAD and subsequently had an episode of syncope 2/2 to hypotension. Considering new onset HFrEF, recent PCI with exposure to arterial contrast, and episode of hypotension it is likely that the etiology of his AKI is multifactorial. Never the less, I put in the C3 complement level to determine if a cholesterol emboli could have contributed. Furthermore, I am concerned that this patient is high risk for progression to HD and will performed Furosemide stress test today.  - C3 complement  - Furosemide 1 mg/kg and record urine output over 2 hours. - Will monitor urine output closely and replace volume with IV crystalloids as he is not  clinically volume overloaded.  - Monitor renal function closely with daily BMP - If patient fails furosemide challenge, will consider starting HD, but otherwise no emergent need to start today. - Strict I&Os, daily weights, salt and fluid restricted diet.   2. Hyponatremia: serum Na of 134. No emergent need for dialysis. Does not appear overtly volume overloaded on exam.  3. 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 Hyperlipidemia: - Continue carvedilol  - Continue to hold nephrotoxic medications in the setting of worsening renal insufficiency   - Continue high intensity statin   6. Essential HTN: - Monitor patients blood pressure closely and continue to hold hydralazine in the setting of recent hypotensive episode.   7. Insulin dependent DM: - Continue current diabetes medications per cardiology.    Marianna Payment, D.O. [facility], PGY-2 Pager: [phone], Phone: [phone] Date 01/30/2020 Time 1:40 PM

## 2020-01-31 LAB — BASIC METABOLIC PANEL
Anion gap: 13 (ref 5–15)
BUN: 56 mg/dL — ABNORMAL HIGH (ref 6–20)
CO2: 22 mmol/L (ref 22–32)
Calcium: 8.6 mg/dL — ABNORMAL LOW (ref 8.9–10.3)
Chloride: 102 mmol/L (ref 98–111)
Creatinine, Ser: 7.44 mg/dL — ABNORMAL HIGH (ref 0.61–1.24)
GFR calc Af Amer: 10 mL/min — ABNORMAL LOW (ref >60)
GFR calc non Af Amer: 9 mL/min — ABNORMAL LOW (ref >60)
Glucose, Bld: 115 mg/dL — ABNORMAL HIGH (ref 70–99)
Potassium: 3.1 mmol/L — ABNORMAL LOW (ref 3.5–5.1)
Sodium: 137 mmol/L (ref 135–145)

## 2020-01-31 LAB — CBC
HCT: 29.2 % — ABNORMAL LOW (ref 39.0–52.0)
Hemoglobin: 9.5 g/dL — ABNORMAL LOW (ref 13.0–17.0)
MCH: 28.1 pg (ref 26.0–34.0)
MCHC: 32.5 g/dL (ref 30.0–36.0)
MCV: 86.4 fL (ref 80.0–100.0)
Platelets: 211 10*3/uL (ref 150–400)
RBC: 3.38 MIL/uL — ABNORMAL LOW (ref 4.22–5.81)
RDW: 12.7 % (ref 11.5–15.5)
WBC: 10.9 10*3/uL — ABNORMAL HIGH (ref 4.0–10.5)
nRBC: 0 % (ref 0.0–0.2)

## 2020-01-31 LAB — TSH: TSH: 1.439 u[IU]/mL (ref 0.350–4.500)

## 2020-01-31 LAB — GLUCOSE, CAPILLARY
Glucose-Capillary: 111 mg/dL — ABNORMAL HIGH (ref 70–99)
Glucose-Capillary: 145 mg/dL — ABNORMAL HIGH (ref 70–99)
Glucose-Capillary: 81 mg/dL (ref 70–99)
Glucose-Capillary: 102 mg/dL — ABNORMAL HIGH (ref 70–99)

## 2020-01-31 MED ORDER — POTASSIUM CHLORIDE 20 MEQ PO PACK
40.0000 meq | PACK | Freq: Once | ORAL | Status: AC
  Administered 2020-01-31: 40 meq via ORAL
  Filled 2020-01-31: qty 2

## 2020-01-31 MED ORDER — CARVEDILOL 3.125 MG PO TABS
3.1250 mg | ORAL_TABLET | Freq: Two times a day (BID) | ORAL | Status: DC
  Administered 2020-01-31 – 2020-02-01 (×2): 3.125 mg via ORAL
  Filled 2020-01-31 (×2): qty 1

## 2020-01-31 NOTE — Progress Notes (Signed)
Pt had orthostatic dizziness with ambulation with cardiac rehab - Dr. Burt Knack has decreased carvedilol to 3.125mg  starting this evening.

## 2020-01-31 NOTE — Progress Notes (Signed)
[facility] Progress Note   [name] is a 34 y.o. male with a pPMH of CAD, DM, HLD, HTN, who presented to [facility] on 08/16 with acute onset chest pain and diaphoresis. The chest pain started the day prior to presentation. He states that the pain was persistent but waxed and waned. He had associated nausea  IN the ED, he was found to have ischemic changes on his EKG and was sent for cardiac catheterization.  He was found to have a 100% stenosis of his proximal LAD with significant anterior wall hypokinesis (EF of 25-35%) and is now s/p DES placement. Patient's Cr on presentation was 2.28 (GFR 41) w/o significant electrolyte abnormalities.   Subsequently he underwent TTE on the following day with minimal improvement of LV function (30-35%). He was stared on [place] and ACEI/ARB/Entresto/Aldosterone antagonists were held due to worsening Cr of 3.41 with 1 L urine output recorded. Patient had an episode of hypotension with associated n/v and syncope. Telemetry did not show any arrhythmias.   On evaluation today, patient states that he is feeling better today. He states that his beathing and energy have improved but does admits to some exertional dyspnea. He also admits to decreased urine output in the last 24 hours without associated dysuria or hematuria. His kidney function has significantly worsened since admission and is now at a Cr of 6.22 (GFR 12) without significant electrolyte abnormalities or uremic symptoms. Specifically he denies, change in appetite or taste, pruritis, nausea, or confusion.   Assessment/ Plan:   1. Acute on chronic kidney disease - Patient had furosemide stress test yesterday with < 200 cc output in 2 hours. I was initially concerned that the patient may require HD for worsening renal failure, but patient subsequently had roughly 1.4 L urine output overnight. Therefore, we will hold of on HD and monitor his kidney function closely. C3 complement still pending.   - C3 complement pending - Monitor renal function closely with daily BMP - No emergent need for HD at this time. Hold off on temp cath for today. - Strict I&Os, daily weights, salt and fluid restricted diet.   2. Hyponatremia: hyponatremia has resolved. serum Na of 137 today.  3. Hypokalemia - patient had low potassium this morning (3.1) - Replete potassium today.   4. 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 Acute HFrEF (30-35%) 6. Hyperlipidemia: - Continue carvedilol  - Continue to hold nephrotoxic medications in the setting of worsening renal insufficiency   - Continue high intensity statin   7. Essential HTN: - Patient had a soft blood pressure yesterday evening after furosemide stress test. He denies any symptoms. Blood pressure this morning is 123/84..   8. Insulin dependent DM: - Continue current diabetes medications per cardiology.   Subjective:   Patient sitting up on the side of the bed and resting comfortably. He states that he is doing well. He admits to good urine output overnight without dysuria or hematuria. He  denies chest pain, SHOB, abdominal bloating, or orthopnea. We discussed the furosemide stress test and I informed him that we will hold off on temp cath placement for today and monitor his kidney function closely. Patient denies any signs or symptoms concerning for uremia.   Objective:   BP (!) 131/93 (BP Location: Right Arm)   Pulse 88   Temp 99.8 F (37.7 C) (Oral)   Resp 16   Ht 6\' 4"  (1.93 m)   Wt (!) 142 kg   SpO2 99%  BMI 38.10 kg/m   Intake/Output Summary (Last 24 hours) at 01/31/2020 0818 Last data filed at 01/31/2020 0500 Gross per 24 hour  Intake 1308 ml  Output 1295 ml  Net 13 ml   Weight change: 3.576 kg  Physical Exam: Physical Exam Constitutional:      Appearance: Normal appearance. He is obese.  HENT:     Head: Normocephalic and atraumatic.  Eyes:     Extraocular Movements: Extraocular movements intact.  Cardiovascular:      Rate and Rhythm: Normal rate.     Pulses: Normal pulses.     Heart sounds: Murmur heard.   Pulmonary:     Effort: Pulmonary effort is normal.     Breath sounds: Normal breath sounds.  Abdominal:     General: Bowel sounds are normal.     Palpations: Abdomen is soft.     Tenderness: There is no abdominal tenderness.  Musculoskeletal:        General: Normal range of motion.     Cervical back: Normal range of motion.     Right lower leg: Edema (trace to 1+) present.     Left lower leg: Edema (trace to 1 +) present.  Skin:    General: Skin is warm and dry.  Neurological:     Mental Status: He is alert and oriented to person, place, and time. Mental status is at baseline.  Psychiatric:        Mood and Affect: Mood normal.     Imaging: US RENAL  Result Date: 01/30/2020 CLINICAL DATA:  Acute kidney injury. EXAM: RENAL / URINARY TRACT ULTRASOUND COMPLETE COMPARISON:  None. FINDINGS: Right Kidney: Renal measurements: 14.5 x 6.2 x 5.9 cm = volume: 280 mL. Echogenicity within normal limits. No mass or hydronephrosis visualized. Left Kidney: Renal measurements: 15.1 x 6.7 x 5.7 cm = volume: 310 mL. Echogenicity within normal limits. No mass or hydronephrosis visualized. Bladder: Appears normal for degree of bladder distention. No postvoid residual. Other: None. IMPRESSION: Unremarkable sonographic appearance of the kidneys and bladder. Prominently sized kidneys are likely normal for patient's height. Electronically Signed   By: Keith Rake M.D.   On: 01/30/2020 22:51   ECHOCARDIOGRAM COMPLETE  Result Date: 01/29/2020    ECHOCARDIOGRAM REPORT   Patient Name:   [name] Date of Exam: 01/29/2020 Medical Rec #:  [id number]    Height:       76.0 in Accession #:    [id number]   Weight:       304.2 lb Date of Birth:  [birth year]    BSA:          2.648 m Patient Age:    34 years     BP:           129/114 mmHg Patient Gender: M            HR:           113 bpm. Exam Location:  Inpatient Procedure: 2D  Echo, Cardiac Doppler and Color Doppler Indications:    Acute MI  History:        Patient has no prior history of Echocardiogram examinations.                 Acute MI and CAD; Risk Factors:Dyslipidemia, Diabetes,                 Hypertension and Obesity.  Sonographer:    Dustin Flock Referring Phys: [place]  1. Left ventricular ejection  fraction, by estimation, is 30 to 35%. The left ventricle has moderately decreased function. The left ventricle demonstrates regional wall motion abnormalities (see scoring diagram/findings for description). Anterior/septal akinesis. There is moderate left ventricular hypertrophy. Left ventricular diastolic parameters are indeterminate.  2. Right ventricular systolic function is normal. The right ventricular size is normal.  3. The mitral valve is normal in structure. No evidence of mitral valve regurgitation.  4. The aortic valve is tricuspid. Aortic valve regurgitation is not visualized. No aortic stenosis is present.  5. Small pericardial effusion, measures up to 0.5cm adjacent to RV free wall at end diastole. No RV diastolic collapse seen.  6. The inferior vena cava is normal in size with <50% respiratory variability, suggesting right atrial pressure of 8 mmHg. FINDINGS  Left Ventricle: Left ventricular ejection fraction, by estimation, is 30 to 35%. The left ventricle has moderately decreased function. The left ventricle demonstrates regional wall motion abnormalities. The left ventricular internal cavity size was normal in size. There is moderate left ventricular hypertrophy. Left ventricular diastolic parameters are indeterminate.  LV Wall Scoring: The mid and distal anterior wall, mid and distal anterior septum, mid inferoseptal segment, and apex are akinetic. The basal anteroseptal segment, basal anterior segment, and basal inferoseptal segment are hypokinetic. The entire lateral wall and entire inferior wall are normal. Right Ventricle:  The right ventricular size is normal. Right vetricular wall thickness was not assessed. Right ventricular systolic function is normal. Left Atrium: Left atrial size was normal in size. Right Atrium: Right atrial size was normal in size. Pericardium: A small pericardial effusion is present. Mitral Valve: The mitral valve is normal in structure. No evidence of mitral valve regurgitation. Tricuspid Valve: The tricuspid valve is normal in structure. Tricuspid valve regurgitation is trivial. Aortic Valve: The aortic valve is tricuspid. Aortic valve regurgitation is not visualized. No aortic stenosis is present. Pulmonic Valve: The pulmonic valve was not well visualized. Pulmonic valve regurgitation is not visualized. Aorta: The aortic root is normal in size and structure. Venous: The inferior vena cava is normal in size with less than 50% respiratory variability, suggesting right atrial pressure of 8 mmHg. IAS/Shunts: The interatrial septum was not well visualized.  LEFT VENTRICLE PLAX 2D LVIDd:         4.80 cm  Diastology LVIDs:         3.70 cm  LV e' lateral:   6.85 cm/s LV PW:         1.30 cm  LV E/e' lateral: 10.6 LV IVS:        1.40 cm  LV e' medial:    7.29 cm/s LVOT diam:     2.20 cm  LV E/e' medial:  10.0 LV SV:         41 LV SV Index:   16 LVOT Area:     3.80 cm  RIGHT VENTRICLE RV Basal diam:  3.30 cm RV S prime:     13.50 cm/s TAPSE (M-mode): 2.2 cm LEFT ATRIUM             Index       RIGHT ATRIUM           Index LA diam:        4.00 cm 1.51 cm/m  RA Area:     12.30 cm LA Vol (A2C):   55.4 ml 20.93 ml/m RA Volume:   28.40 ml  10.73 ml/m LA Vol (A4C):   71.0 ml 26.82 ml/m LA Biplane Vol: 66.5 ml 25.12  ml/m  AORTIC VALVE LVOT Vmax:   69.20 cm/s LVOT Vmean:  48.500 cm/s LVOT VTI:    0.109 m  AORTA Ao Root diam: 2.90 cm MITRAL VALVE MV Area (PHT): 15.17 cm   SHUNTS MV Decel Time: 50 msec     Systemic VTI:  0.11 m MV E velocity: 72.80 cm/s  Systemic Diam: 2.20 cm MV A velocity: 71.10 cm/s MV E/A ratio:   1.02 Oswaldo Milian MD Electronically signed by Oswaldo Milian MD Signature Date/Time: 01/29/2020/10:46:34 AM    Final    ECHOCARDIOGRAM LIMITED  Result Date: 01/30/2020    ECHOCARDIOGRAM LIMITED REPORT   Patient Name:   [name] Date of Exam: 01/30/2020 Medical Rec #:  [id number]    Height:       76.0 in Accession #:    [id number]   Weight:       305.1 lb Date of Birth:  1986-02-08    BSA:          2.651 m Patient Age:    59 years     BP:           114/72 mmHg Patient Gender: M            HR:           99 bpm. Exam Location:  Inpatient Procedure: Limited Echo and Limited Color Doppler                       STAT ECHO Reported to: Dr Jenkins Rouge on 01/30/2020 10:57:00 AM. Indications:    R55 Syncope; I31.3 Pericardial effusion  History:        Patient has prior history of Echocardiogram examinations, most                 recent 01/29/2020. CAD and Previous Myocardial Infarction; Risk                 Factors:Hypertension, Diabetes and Dyslipidemia.  Sonographer:    Jonelle Sidle Dance Referring Phys: [address] [place]  1. Septal apical and inferior apical hypokinesis . Left ventricular ejection fraction, by estimation, is 40 to 45%. The left ventricle has mildly decreased function. The left ventricle demonstrates regional wall motion abnormalities (see scoring diagram/findings for description). The left ventricular internal cavity size was mildly dilated.  2. Right ventricular systolic function is normal. The right ventricular size is normal.  3. Left atrial size was moderately dilated.  4. The mitral valve is normal in structure. Trivial mitral valve regurgitation. No evidence of mitral stenosis.  5. The aortic valve is tricuspid. Aortic valve regurgitation is not visualized. No aortic stenosis is present.  6. The inferior vena cava is normal in size with greater than 50% respiratory variability, suggesting right atrial pressure of 3 mmHg. FINDINGS  Left Ventricle: Septal apical and inferior  apical hypokinesis. Left ventricular ejection fraction, by estimation, is 40 to 45%. The left ventricle has mildly decreased function. The left ventricle demonstrates regional wall motion abnormalities. The left ventricular internal cavity size was mildly dilated. There is no left ventricular hypertrophy. Right Ventricle: The right ventricular size is normal. No increase in right ventricular wall thickness. Right ventricular systolic function is normal. Left Atrium: Left atrial size was moderately dilated. Right Atrium: Right atrial size was normal in size. Pericardium: Trivial pericardial effusion is present. The pericardial effusion is anterior to the right ventricle and posterior to the left ventricle. Mitral Valve: The mitral valve is normal in structure.  There is mild thickening of the mitral valve leaflet(s). Normal mobility of the mitral valve leaflets. Trivial mitral valve regurgitation. No evidence of mitral valve stenosis. Tricuspid Valve: The tricuspid valve is normal in structure. Tricuspid valve regurgitation is mild . No evidence of tricuspid stenosis. Aortic Valve: The aortic valve is tricuspid. Aortic valve regurgitation is not visualized. No aortic stenosis is present. Pulmonic Valve: The pulmonic valve was normal in structure. Pulmonic valve regurgitation is not visualized. No evidence of pulmonic stenosis. Aorta: The aortic root is normal in size and structure. Venous: The inferior vena cava is normal in size with greater than 50% respiratory variability, suggesting right atrial pressure of 3 mmHg. IAS/Shunts: No atrial level shunt detected by color flow Doppler. LEFT VENTRICLE PLAX 2D LVIDd:         5.19 cm LVIDs:         3.68 cm LV PW:         1.33 cm LV IVS:        1.07 cm LVOT diam:     2.30 cm LVOT Area:     4.15 cm  RIGHT VENTRICLE         IVC TAPSE (M-mode): 2.2 cm  IVC diam: 2.43 cm LEFT ATRIUM             Index LA diam:        4.50 cm 1.70 cm/m LA Vol (A2C):   44.1 ml 16.64 ml/m LA Vol  (A4C):   76.6 ml 28.90 ml/m LA Biplane Vol: 59.5 ml 22.45 ml/m   AORTA Ao Root diam: 3.10 cm Ao Asc diam:  3.10 cm  SHUNTS Systemic Diam: 2.30 cm Jenkins Rouge MD Electronically signed by Jenkins Rouge MD Signature Date/Time: 01/30/2020/11:00:36 AM    Final     Labs: BMET Recent Labs  Lab 01/28/20 1242 01/28/20 1315 01/29/20 0731 01/30/20 0830 01/31/20 0420  NA 138 140 133* 134* 137  K 3.6 3.6 4.2 3.9 3.1*  CL 103 102 99 100 102  CO2 24  --  22 22 22   GLUCOSE 217* 206* 412* 242* 115*  BUN 19 21* 30* 47* 56*  CREATININE 2.28* 2.00* 3.41* 6.22* 7.44*  CALCIUM 9.0  --  8.5* 8.6* 8.6*   CBC Recent Labs  Lab 01/28/20 1242 01/28/20 1242 01/28/20 1315 01/29/20 0731 01/30/20 0830 01/31/20 0420  WBC 14.8*  --   --  17.5* 14.2* 10.9*  NEUTROABS 11.6*  --   --   --   --   --   HGB 12.0*   < > 11.2* 10.5* 9.7* 9.5*  HCT 36.5*   < > 33.0* 31.8* 30.1* 29.2*  MCV 86.3  --   --  87.1 86.0 86.4  PLT 248  --   --  214 213 211   < > = values in this interval not displayed.    Medications:    . aspirin  81 mg Oral Daily  . atorvastatin  80 mg Oral Daily  . carvedilol  12.5 mg Oral BID WC  . Chlorhexidine Gluconate Cloth  6 each Topical Daily  . clopidogrel  75 mg Oral Daily  . insulin aspart  0-15 Units Subcutaneous TID WC  . insulin aspart  0-5 Units Subcutaneous QHS  . insulin aspart  12 Units Subcutaneous TID WC  . insulin glargine  60 Units Subcutaneous QHS  . pantoprazole  40 mg Oral Daily  . potassium chloride  40 mEq Oral Once  . sodium chloride flush  3  mL Intravenous Q12H     Marianna Payment, D.O. [facility], PGY-2 Pager: [phone], Phone: [phone] Date 01/31/2020 Time 8:35 AM

## 2020-01-31 NOTE — Progress Notes (Signed)
Patient given Furosemide 140 mg for diuretic challenge. After 2 hours patient produced less than 200 cc of urine (~190cc). Patient's nurse states that his blood pressure was soft at 99/70 without symptoms. I will hold his evening dose of carvedilol due to previous episode of syncope 2/2 to hypotension and encourage PO fluid intake. He will likely need a temp cath placed tomorrow and plan for HD in the near future.  Marianna Payment, D.O. [facility], PGY-2 Pager: [phone], Phone: [phone] Date 01/30/20 Time 7:00pm

## 2020-01-31 NOTE — Progress Notes (Addendum)
Progress Note  Patient Name: [name] Date of Encounter: 01/31/2020  Primary Cardiologist: Larae Grooms, MD  Subjective   Initially only had <200cc UOP yesterday with Lasix challenge but overnight he urinated 1L and then another 1/2 L this morning. Denies CP or SOB.  Inpatient Medications    Scheduled Meds: . aspirin  81 mg Oral Daily  . atorvastatin  80 mg Oral Daily  . carvedilol  12.5 mg Oral BID WC  . clopidogrel  75 mg Oral Daily  . insulin aspart  0-15 Units Subcutaneous TID WC  . insulin aspart  0-5 Units Subcutaneous QHS  . insulin aspart  12 Units Subcutaneous TID WC  . insulin glargine  60 Units Subcutaneous QHS  . pantoprazole  40 mg Oral Daily  . sodium chloride flush  3 mL Intravenous Q12H   Continuous Infusions: . sodium chloride     PRN Meds: sodium chloride, acetaminophen, morphine, ondansetron (ZOFRAN) IV, sodium chloride flush   Vital Signs    Vitals:   01/31/20 0445 01/31/20 0655 01/31/20 0810 01/31/20 0810  BP: (!) 131/93  123/84   Pulse:    88  Resp: 20   16  Temp: 99.8 F (37.7 C)     TempSrc: Oral     SpO2: 96%   99%  Weight:  (!) 142 kg    Height:        Intake/Output Summary (Last 24 hours) at 01/31/2020 0850 Last data filed at 01/31/2020 0500 Gross per 24 hour  Intake 1308 ml  Output 1295 ml  Net 13 ml   Last 3 Weights 01/31/2020 01/30/2020 01/30/2020  Weight (lbs) 313 lb 305 lb 1.9 oz 305 lb 1.9 oz  Weight (kg) 141.976 kg 138.4 kg 138.4 kg     Telemetry    NSR - Personally Reviewed  Physical Exam   GEN: No acute distress. Habitus c/w obesity. HEENT: Normocephalic, atraumatic, sclera non-icteric. Neck: No JVD or bruits. Cardiac: RRR no murmurs, rubs, or gallops.  Radials/DP/PT 1+ and equal bilaterally.  Respiratory: Clear to auscultation bilaterally. Breathing is unlabored. GI: Soft, nontender, non-distended, BS +x 4. MS: no deformity. Extremities: No clubbing or cyanosis. No edema. Distal pedal pulses are 2+ and  equal bilaterally. Right radial site with very soft fairly flat hematoma in the forearm that appears to be resolving Neuro:  AAOx3. Follows commands. Psych:  Responds to questions appropriately with a normal affect.  Labs    High Sensitivity Troponin:   Recent Labs  Lab 01/28/20 1242 01/28/20 1623  TROPONINIHS >27,000* >27,000*      Cardiac EnzymesNo results for input(s): TROPONINI in the last 168 hours. No results for input(s): TROPIPOC in the last 168 hours.   Chemistry Recent Labs  Lab 01/28/20 1242 01/28/20 1315 01/29/20 0731 01/30/20 0830 01/31/20 0420  NA 138   < > 133* 134* 137  K 3.6   < > 4.2 3.9 3.1*  CL 103   < > 99 100 102  CO2 24   < > 22 22 22   GLUCOSE 217*   < > 412* 242* 115*  BUN 19   < > 30* 47* 56*  CREATININE 2.28*   < > 3.41* 6.22* 7.44*  CALCIUM 9.0   < > 8.5* 8.6* 8.6*  PROT 6.6  --  6.1*  --   --   ALBUMIN 2.7*  --  2.3*  --   --   AST 113*  --  63*  --   --  ALT 45*  --  35  --   --   ALKPHOS 93  --  88  --   --   BILITOT 1.7*  --  1.7*  --   --   GFRNONAA 36*   < > 22* 11* 9*  GFRAA 41*   < > 25* 12* 10*  ANIONGAP 11   < > 12 12 13    < > = values in this interval not displayed.     Hematology Recent Labs  Lab 01/29/20 0731 01/30/20 0830 01/31/20 0420  WBC 17.5* 14.2* 10.9*  RBC 3.65* 3.50* 3.38*  HGB 10.5* 9.7* 9.5*  HCT 31.8* 30.1* 29.2*  MCV 87.1 86.0 86.4  MCH 28.8 27.7 28.1  MCHC 33.0 32.2 32.5  RDW 13.1 13.0 12.7  PLT 214 213 211    BNPNo results for input(s): BNP, PROBNP in the last 168 hours.   DDimer No results for input(s): DDIMER in the last 168 hours.   Radiology    US RENAL  Result Date: 01/30/2020 CLINICAL DATA:  Acute kidney injury. EXAM: RENAL / URINARY TRACT ULTRASOUND COMPLETE COMPARISON:  None. FINDINGS: Right Kidney: Renal measurements: 14.5 x 6.2 x 5.9 cm = volume: 280 mL. Echogenicity within normal limits. No mass or hydronephrosis visualized. Left Kidney: Renal measurements: 15.1 x 6.7 x 5.7 cm =  volume: 310 mL. Echogenicity within normal limits. No mass or hydronephrosis visualized. Bladder: Appears normal for degree of bladder distention. No postvoid residual. Other: None. IMPRESSION: Unremarkable sonographic appearance of the kidneys and bladder. Prominently sized kidneys are likely normal for patient's height. Electronically Signed   By: Keith Rake M.D.   On: 01/30/2020 22:51   ECHOCARDIOGRAM LIMITED  Result Date: 01/30/2020    ECHOCARDIOGRAM LIMITED REPORT   Patient Name:   [name] Date of Exam: 01/30/2020 Medical Rec #:  [id number]    Height:       76.0 in Accession #:    [id number]   Weight:       305.1 lb Date of Birth:  Feb 16, 1986    BSA:          2.651 m Patient Age:    34 years     BP:           114/72 mmHg Patient Gender: M            HR:           99 bpm. Exam Location:  Inpatient Procedure: Limited Echo and Limited Color Doppler                       STAT ECHO Reported to: Dr Jenkins Rouge on 01/30/2020 10:57:00 AM. Indications:    R55 Syncope; I31.3 Pericardial effusion  History:        Patient has prior history of Echocardiogram examinations, most                 recent 01/29/2020. CAD and Previous Myocardial Infarction; Risk                 Factors:Hypertension, Diabetes and Dyslipidemia.  Sonographer:    Jonelle Sidle Dance Referring Phys: [address] [place]  1. Septal apical and inferior apical hypokinesis . Left ventricular ejection fraction, by estimation, is 40 to 45%. The left ventricle has mildly decreased function. The left ventricle demonstrates regional wall motion abnormalities (see scoring diagram/findings for description). The left ventricular internal cavity size was mildly dilated.  2. Right ventricular systolic  function is normal. The right ventricular size is normal.  3. Left atrial size was moderately dilated.  4. The mitral valve is normal in structure. Trivial mitral valve regurgitation. No evidence of mitral stenosis.  5. The aortic valve is tricuspid.  Aortic valve regurgitation is not visualized. No aortic stenosis is present.  6. The inferior vena cava is normal in size with greater than 50% respiratory variability, suggesting right atrial pressure of 3 mmHg. FINDINGS  Left Ventricle: Septal apical and inferior apical hypokinesis. Left ventricular ejection fraction, by estimation, is 40 to 45%. The left ventricle has mildly decreased function. The left ventricle demonstrates regional wall motion abnormalities. The left ventricular internal cavity size was mildly dilated. There is no left ventricular hypertrophy. Right Ventricle: The right ventricular size is normal. No increase in right ventricular wall thickness. Right ventricular systolic function is normal. Left Atrium: Left atrial size was moderately dilated. Right Atrium: Right atrial size was normal in size. Pericardium: Trivial pericardial effusion is present. The pericardial effusion is anterior to the right ventricle and posterior to the left ventricle. Mitral Valve: The mitral valve is normal in structure. There is mild thickening of the mitral valve leaflet(s). Normal mobility of the mitral valve leaflets. Trivial mitral valve regurgitation. No evidence of mitral valve stenosis. Tricuspid Valve: The tricuspid valve is normal in structure. Tricuspid valve regurgitation is mild . No evidence of tricuspid stenosis. Aortic Valve: The aortic valve is tricuspid. Aortic valve regurgitation is not visualized. No aortic stenosis is present. Pulmonic Valve: The pulmonic valve was normal in structure. Pulmonic valve regurgitation is not visualized. No evidence of pulmonic stenosis. Aorta: The aortic root is normal in size and structure. Venous: The inferior vena cava is normal in size with greater than 50% respiratory variability, suggesting right atrial pressure of 3 mmHg. IAS/Shunts: No atrial level shunt detected by color flow Doppler. LEFT VENTRICLE PLAX 2D LVIDd:         5.19 cm LVIDs:         3.68 cm LV  PW:         1.33 cm LV IVS:        1.07 cm LVOT diam:     2.30 cm LVOT Area:     4.15 cm  RIGHT VENTRICLE         IVC TAPSE (M-mode): 2.2 cm  IVC diam: 2.43 cm LEFT ATRIUM             Index LA diam:        4.50 cm 1.70 cm/m LA Vol (A2C):   44.1 ml 16.64 ml/m LA Vol (A4C):   76.6 ml 28.90 ml/m LA Biplane Vol: 59.5 ml 22.45 ml/m   AORTA Ao Root diam: 3.10 cm Ao Asc diam:  3.10 cm  SHUNTS Systemic Diam: 2.30 cm Jenkins Rouge MD Electronically signed by Jenkins Rouge MD Signature Date/Time: 01/30/2020/11:00:36 AM    Final     Cardiac Studies    2D echo 01/29/20 1. Left ventricular ejection fraction, by estimation, is 30 to 35%. The  left ventricle has moderately decreased function. The left ventricle  demonstrates regional wall motion abnormalities (see scoring  diagram/findings for description). Anterior/septal  akinesis. There is moderate left ventricular hypertrophy. Left ventricular  diastolic parameters are indeterminate.  2. Right ventricular systolic function is normal. The right ventricular  size is normal.  3. The mitral valve is normal in structure. No evidence of mitral valve  regurgitation.  4. The aortic valve is tricuspid. Aortic valve  regurgitation is not  visualized. No aortic stenosis is present.  5. Small pericardial effusion, measures up to 0.5cm adjacent to RV free  wall at end diastole. No RV diastolic collapse seen.  6. The inferior vena cava is normal in size with <50% respiratory  variability, suggesting right atrial pressure of 8 mmHg.   Limited echo 01/30/20  1. Septal apical and inferior apical hypokinesis . Left ventricular  ejection fraction, by estimation, is 40 to 45%. The left ventricle has  mildly decreased function. The left ventricle demonstrates regional wall  motion abnormalities (see scoring  diagram/findings for description). The left ventricular internal cavity  size was mildly dilated.  2. Right ventricular systolic function is normal. The  right ventricular  size is normal.  3. Left atrial size was moderately dilated.  4. The mitral valve is normal in structure. Trivial mitral valve  regurgitation. No evidence of mitral stenosis.  5. The aortic valve is tricuspid. Aortic valve regurgitation is not  visualized. No aortic stenosis is present.  6. The inferior vena cava is normal in size with greater than 50%  respiratory variability, suggesting right atrial pressure of 3 mmHg.   LHC 01/28/20 Prox LAD lesion is 100% stenosed. Late presenting anterior MI, as his symptoms have been going on for about 24 hours.  A drug-eluting stent was successfully placed using a SYNERGY XD [id number], postdilated to greater than 3 mm in diameter.  Post intervention, there is a 0% residual stenosis.  There is moderate to severe left ventricular systolic dysfunction with anterior wall hypokinesis.  LV end diastolic pressure is mildly elevated.  The left ventricular ejection fraction is 25-35% by visual estimate.  There is no aortic valve stenosis. Watch for 48 hours. Check echocardiogram. May need to consider LifeVest. Holding lisinopril at this time since his creatinine was elevated in the Cath Lab at the time of procedure. 1 year of dual antiplatelet therapy recommended along with aggressive secondary prevention  Patient Profile     34 y.o. male with obesity, IDDM, HTN, HLD, family history of CAD began having chest pain 1 day prior to admission. Due to persistent pain, came to the hospital where EKG demonstrated anterior Q waves and marked ST elevation. Cath showed 100% prox LAD (late presenting MI), s/p DES, LVEF 25-35%, mildly elevated LVEDP. 2D echo showed EF 30-35% with anterior/septal akinesis, normal RV function, small pericardial effusion. Also found to have AKI, uncontrolled DM, severe HLD.  Assessment & Plan    1. 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 Acute systolic CHF - initial EF 30-35%, with repeat limited echo 01/30/20 showing improvement to 40-45% therefore no LifeVest needed (rep aware order cancelled) - carvedilol held yesterday PM due to hypotension, already received this AM - hydralazine stopped 8/17 due to hypotension - avoid additional agents to avoid hypotension/hypoperfusion - no ACEI/ARB/ARNI/spiro due to elevated creatinine - appreciate nephro input  3. Oliguric acute renal failure superimposed on probable CKD stage II (prior Cr 1.2-1.3), present on admission - Cr 2.28 on arrival -> 3.41 -> 6.22 -> 7.44 - appreciate nephrology input -initially had limited response to Lasix challenge but overnight UOP picked up - they plan to continue to monitor - renal US unremarkable  4. Essential HTN - avoid hypotension in context of #3 - baseline TSH in AM  5.  Hyperlipidemia goal LDL <70 - TChol 290, HDL 45, LDL 176, trig 344 - now on high dose atorvastatin - if the patient is tolerating statin at time of follow-up appointment, would consider rechecking liver function/lipid panel in 6 weeks  6. Insulin dependent diabetes mellitus - continue Lantus, meal coverage - appreciate diabetes coordinator guidance this admission - very helpful  7. Pericardial effusion - trivial by repeat echo 8/8  8. Leukocytosis/anemia - f/u labs show leukocytosis improving - anemia remains fairly stable in 9 range ?multifactorial from acute illness/renal insufficiency - hemoccult ordered - continue to follow  Signed, Charlie Pitter, PA-C 01/31/2020, 8:50 AM     For questions or updates, please contact [place] Please consult [email or web address] for contact info under Cardiology/STEMI.  Patient seen, examined. Available data reviewed. Agree with findings, assessment, and plan as outlined by Melina Copa, PA-C.  On my exam the patient is alert,  oriented, in no distress.  Lungs are clear, heart is regular rate and rhythm with no murmur gallop, abdomen soft and nontender, extremities have no edema.  The patient remains hemodynamically stable.  His creatinine is increased further to 7.4 mg/dL, but he started making more urine overnight.  Appreciate the renal team's care of this patient.  Cardiac status stable.  Patient will remain an inpatient while we monitor his renal function in the setting of severe acute on chronic kidney injury.  Sherren Mocha, M.D. 01/31/2020 10:19 AM

## 2020-01-31 NOTE — Progress Notes (Signed)
CARDIAC REHAB PHASE I   PRE:  Rate/Rhythm: 84 SR  BP:  Sitting: 106/53      POST:  Rate/Rhythm: 81 SR  BP:  Sitting: 92/71   Offered to walk with pt, pt agreeable. Pt sitting on EOB pre walk BP taken, pt then c/o some lightheadedness. Pt encouraged to rest while I got bath supplies, pt then c/o worsening lightheadedness. BP retaken as above. Pt then c/o room spinning. Pt helped to lie back in bed. Encouraged rest and not getting up without help if he feels off. MD and RN made aware. Wife at bedside. Will continue to follow.  [id number] Rufina Falco, RN BSN 01/31/2020 10:41 AM

## 2020-02-01 ENCOUNTER — Other Ambulatory Visit: Payer: Self-pay | Admitting: Cardiology

## 2020-02-01 DIAGNOSIS — N179 Acute kidney failure, unspecified: Secondary | ICD-10-CM

## 2020-02-01 LAB — BASIC METABOLIC PANEL
Anion gap: 12 (ref 5–15)
BUN: 56 mg/dL — ABNORMAL HIGH (ref 6–20)
CO2: 21 mmol/L — ABNORMAL LOW (ref 22–32)
Calcium: 8.9 mg/dL (ref 8.9–10.3)
Chloride: 105 mmol/L (ref 98–111)
Creatinine, Ser: 5.64 mg/dL — ABNORMAL HIGH (ref 0.61–1.24)
GFR calc Af Amer: 14 mL/min — ABNORMAL LOW (ref >60)
GFR calc non Af Amer: 12 mL/min — ABNORMAL LOW (ref >60)
Glucose, Bld: 130 mg/dL — ABNORMAL HIGH (ref 70–99)
Potassium: 3.4 mmol/L — ABNORMAL LOW (ref 3.5–5.1)
Sodium: 138 mmol/L (ref 135–145)

## 2020-02-01 LAB — CBC
HCT: 30.6 % — ABNORMAL LOW (ref 39.0–52.0)
Hemoglobin: 10 g/dL — ABNORMAL LOW (ref 13.0–17.0)
MCH: 28.2 pg (ref 26.0–34.0)
MCHC: 32.7 g/dL (ref 30.0–36.0)
MCV: 86.4 fL (ref 80.0–100.0)
Platelets: 258 10*3/uL (ref 150–400)
RBC: 3.54 MIL/uL — ABNORMAL LOW (ref 4.22–5.81)
RDW: 12.6 % (ref 11.5–15.5)
WBC: 10.4 10*3/uL (ref 4.0–10.5)
nRBC: 0 % (ref 0.0–0.2)

## 2020-02-01 LAB — C3 COMPLEMENT: C3 Complement: 183 mg/dL — ABNORMAL HIGH (ref 82–167)

## 2020-02-01 LAB — GLUCOSE, CAPILLARY
Glucose-Capillary: 104 mg/dL — ABNORMAL HIGH (ref 70–99)
Glucose-Capillary: 141 mg/dL — ABNORMAL HIGH (ref 70–99)

## 2020-02-01 MED ORDER — CARVEDILOL 3.125 MG PO TABS: 3.1250 mg | ORAL_TABLET | Freq: Two times a day (BID) | ORAL | 3 refills | Status: DC

## 2020-02-01 MED ORDER — ATORVASTATIN CALCIUM 80 MG PO TABS: 80.0000 mg | ORAL_TABLET | Freq: Every day | ORAL | 3 refills | Status: DC

## 2020-02-01 MED ORDER — CLOPIDOGREL BISULFATE 75 MG PO TABS: 75.0000 mg | ORAL_TABLET | Freq: Every day | ORAL | 3 refills | Status: DC

## 2020-02-01 NOTE — Discharge Summary (Signed)
Discharge Summary    Patient ID: [name] MRN: [id number]; DOB: Aug 09, 1985  Admit date: 01/28/2020 Discharge date: 02/01/2020  Primary Care Provider: [name], FNP  Primary Cardiologist: Larae Grooms, MD   Discharge Diagnoses    Principal Problem:   Acute anterior wall MI [facility]) Active Problems:   HTN (hypertension)   Hyperlipidemia   Obesity (BMI 30-39.9)   Insulin-requiring or dependent type II diabetes mellitus ([place])   AKI (acute kidney injury) ([place])  Diagnostic Studies/Procedures    2D echo 01/29/20 1. Left ventricular ejection fraction, by estimation, is 30 to 35%. The  left ventricle has moderately decreased function. The left ventricle  demonstrates regional wall motion abnormalities (see scoring  diagram/findings for description). Anterior/septal  akinesis. There is moderate left ventricular hypertrophy. Left ventricular  diastolic parameters are indeterminate.  2. Right ventricular systolic function is normal. The right ventricular  size is normal.  3. The mitral valve is normal in structure. No evidence of mitral valve  regurgitation.  4. The aortic valve is tricuspid. Aortic valve regurgitation is not  visualized. No aortic stenosis is present.  5. Small pericardial effusion, measures up to 0.5cm adjacent to RV free  wall at end diastole. No RV diastolic collapse seen.  6. The inferior vena cava is normal in size with <50% respiratory  variability, suggesting right atrial pressure of 8 mmHg.   Limited echo 01/30/20  1. Septal apical and inferior apical hypokinesis . Left ventricular  ejection fraction, by estimation, is 40 to 45%. The left ventricle has  mildly decreased function. The left ventricle demonstrates regional wall  motion abnormalities (see scoring  diagram/findings for description). The left ventricular internal cavity  size was mildly dilated.  2. Right ventricular systolic function is normal. The right ventricular  size  is normal.  3. Left atrial size was moderately dilated.  4. The mitral valve is normal in structure. Trivial mitral valve  regurgitation. No evidence of mitral stenosis.  5. The aortic valve is tricuspid. Aortic valve regurgitation is not  visualized. No aortic stenosis is present.  6. The inferior vena cava is normal in size with greater than 50%  respiratory variability, suggesting right atrial pressure of 3 mmHg.   LHC 01/28/20: Prox LAD lesion is 100% stenosed. Late presenting anterior MI, as his symptoms have been going on for about 24 hours.  A drug-eluting stent was successfully placed using a SYNERGY XD 2.50X24, postdilated to greater than 3 mm in diameter.  Post intervention, there is a 0% residual stenosis.  There is moderate to severe left ventricular systolic dysfunction with anterior wall hypokinesis.  LV end diastolic pressure is mildly elevated.  The left ventricular ejection fraction is 25-35% by visual estimate.  There is no aortic valve stenosis. Watch for 48 hours. Check echocardiogram. May need to consider LifeVest. Holding lisinopril at this time since his creatinine was elevated in the Cath Lab at the time of procedure. 1 year of dual antiplatelet therapy recommended along with aggressive secondary prevention   History of Present Illness     [name] is a 34 y.o. male with with obesity, IDDM, HTN, HLD, family history of CAD  Who began having chest pain 1 day prior to admission. Due to persistent pain, came to the hospital where EKG demonstratedanterior Q waves and marked ST elevation.Cath showed 100% prox LAD (late presenting MI), s/p DES, LVEF 25-35%, mildly elevated LVEDP. 2D echo showed EF 30-35% with anterior/septal akinesis, normal RV function, small pericardial effusion.  Also found to have AKI, uncontrolled DM, severe HLD  Hospital Course     Anterior MI s/p DES to LAD (late presenting):  -Continue ASA, beta blocker, statin  -Transitioned  to Plavix secondary to SOB with Brilinta. -Stable clinically from a cardiac standpoint however having issues with renal failure   Acute systolic CHF: -Initial LVEF found to be 30-35%, with repeat limited echo 01/30/20 showing improvement to 40-45% therefore no LifeVest needed(rep aware order cancelled) -Carvedilol decreased 01/31/20 secondary to orthostatic hypotension>>tolerating well  -Hydralazine stopped 8/17 due to hypotension -Continue to avoid additional antihypertensive agents to avoid hypotension/hypoperfusion  -No ACEI/ARB/ARNI/spirodue to elevated creatinine  Oliguric acute renal failuresuperimposed on probable CKD stage II (prior Cr 1.2-1.3), present on admission -Cr 2.28 on arrival >>3.41>>6.22>>>7.44>>>>5.64 today with improved UO -Net negative 2.3L (3.7L UO yesterday)  -Renal US unremarkable -Nephrology following with plans for close OP follow up>>>nephrology to arrange   Essential HTN: -Stable, 121/77>100/75>120/74 -Avoid hypotension secondary to #3 -Continue current regimen   Hyperlipidemia goal LDL <70 -LDL 176, trig 344 -Continue high dose atorvastatin -Recheck Lipid panel and LFTs at f/u   Insulin dependent DM: -Continue Lantus, meal coverage -Appreciate diabetes coordinator guidance this admission   Pericardial effusion: -Trivial by repeat echo 8/8  Leukocytosis/anemia: -f/ulabs show leukocytosis improving>>10.4 today  -Anemia improving rat 10.0 today  -Follow with PCP   Consultants: Nephrology   The patient was seen and examined by Dr. Burt Knack who feels that he is stable and ready for discharge today, 02/01/20  Did the patient have an acute coronary syndrome (MI, NSTEMI, STEMI, etc) this admission?:  Yes                               AHA/ACC Clinical Performance & Quality Measures: 1. Aspirin prescribed? - Yes 2. ADP Receptor Inhibitor (Plavix/Clopidogrel, Brilinta/Ticagrelor or Effient/Prasugrel) prescribed (includes medically managed  patients)? - Yes 3. Beta Blocker prescribed? - Yes 4. High Intensity Statin (Lipitor 40-80mg  or Crestor 20-40mg ) prescribed? - Yes 5. EF assessed during THIS hospitalization? - Yes 6. For EF <40%, was ACEI/ARB prescribed? - Not Applicable (EF >/= 56%) 7. For EF <40%, Aldosterone Antagonist (Spironolactone or Eplerenone) prescribed? - Not Applicable (EF >/= 38%) 8. Cardiac Rehab Phase II ordered (including medically managed patients)? - Yes  _____________  Discharge Vitals Blood pressure 119/90, pulse 85, temperature 99 F (37.2 C), temperature source Oral, resp. rate 18, height 6\' 4"  (1.93 m), weight (!) 142 kg, SpO2 99 %.  Filed Weights   01/30/20 0357 01/31/20 0655 02/01/20 0335  Weight: (!) 138.4 kg (!) 142 kg (!) 142 kg   Labs & Radiologic Studies    CBC Recent Labs    01/31/20 0420 02/01/20 0446  WBC 10.9* 10.4  HGB 9.5* 10.0*  HCT 29.2* 30.6*  MCV 86.4 86.4  PLT 211 937   Basic Metabolic Panel Recent Labs    01/31/20 0420 02/01/20 0446  NA 137 138  K 3.1* 3.4*  CL 102 105  CO2 22 21*  GLUCOSE 115* 130*  BUN 56* 56*  CREATININE 7.44* 5.64*  CALCIUM 8.6* 8.9   Liver Function Tests No results for input(s): AST, ALT, ALKPHOS, BILITOT, PROT, ALBUMIN in the last 72 hours. No results for input(s): LIPASE, AMYLASE in the last 72 hours. High Sensitivity Troponin:   Recent Labs  Lab 01/28/20 1242 01/28/20 1623  TROPONINIHS >27,000* >27,000*    BNP Invalid input(s): POCBNP D-Dimer No results for input(s): DDIMER  in the last 72 hours. Hemoglobin A1C No results for input(s): HGBA1C in the last 72 hours. Fasting Lipid Panel No results for input(s): CHOL, HDL, LDLCALC, TRIG, CHOLHDL, LDLDIRECT in the last 72 hours. Thyroid Function Tests Recent Labs    01/31/20 0420  TSH 1.439   _____________  CARDIAC CATHETERIZATION  Result Date: 01/28/2020  Prox LAD lesion is 100% stenosed. Late presenting anterior MI, as his symptoms have been going on for about 24  hours.  A drug-eluting stent was successfully placed using a SYNERGY XD 2.50X24, postdilated to greater than 3 mm in diameter.  Post intervention, there is a 0% residual stenosis.  There is moderate to severe left ventricular systolic dysfunction with anterior wall hypokinesis.  LV end diastolic pressure is mildly elevated.  The left ventricular ejection fraction is 25-35% by visual estimate.  There is no aortic valve stenosis.  Watch for 48 hours.  Check echocardiogram.  May need to consider LifeVest. Holding lisinopril at this time since his creatinine was elevated in the Cath Lab at the time of procedure. 1 year of dual antiplatelet therapy recommended along with aggressive secondary prevention.   US RENAL  Result Date: 01/30/2020 CLINICAL DATA:  Acute kidney injury. EXAM: RENAL / URINARY TRACT ULTRASOUND COMPLETE COMPARISON:  None. FINDINGS: Right Kidney: Renal measurements: 14.5 x 6.2 x 5.9 cm = volume: 280 mL. Echogenicity within normal limits. No mass or hydronephrosis visualized. Left Kidney: Renal measurements: 15.1 x 6.7 x 5.7 cm = volume: 310 mL. Echogenicity within normal limits. No mass or hydronephrosis visualized. Bladder: Appears normal for degree of bladder distention. No postvoid residual. Other: None. IMPRESSION: Unremarkable sonographic appearance of the kidneys and bladder. Prominently sized kidneys are likely normal for patient's height. Electronically Signed   By: Keith Rake M.D.   On: 01/30/2020 22:51   DG CHEST PORT 1 VIEW  Result Date: 01/28/2020 CLINICAL DATA:  Shortness of breath. EXAM: PORTABLE CHEST 1 VIEW COMPARISON:  08/01/2019 FINDINGS: Heart size appears enlarged which may reflect artifact from portable technique. There is no pleural effusion identified. Opacities within the left base may represent atelectasis or pneumonia. Right lung appears clear. IMPRESSION: Left base opacity may represent atelectasis or pneumonia. Electronically Signed   By: Kerby Moors  M.D.   On: 01/28/2020 18:51   ECHOCARDIOGRAM COMPLETE  Result Date: 01/29/2020    ECHOCARDIOGRAM REPORT   Patient Name:   [name] Date of Exam: 01/29/2020 Medical Rec #:  [id number]    Height:       76.0 in Accession #:    [id number]   Weight:       304.2 lb Date of Birth:  1985/08/22    BSA:          2.648 m Patient Age:    37 years     BP:           129/114 mmHg Patient Gender: M            HR:           113 bpm. Exam Location:  Inpatient Procedure: 2D Echo, Cardiac Doppler and Color Doppler Indications:    Acute MI  History:        Patient has no prior history of Echocardiogram examinations.                 Acute MI and CAD; Risk Factors:Dyslipidemia, Diabetes,                 Hypertension and Obesity.  Sonographer:    Dustin Flock Referring Phys: [place]  1. Left ventricular ejection fraction, by estimation, is 30 to 35%. The left ventricle has moderately decreased function. The left ventricle demonstrates regional wall motion abnormalities (see scoring diagram/findings for description). Anterior/septal akinesis. There is moderate left ventricular hypertrophy. Left ventricular diastolic parameters are indeterminate.  2. Right ventricular systolic function is normal. The right ventricular size is normal.  3. The mitral valve is normal in structure. No evidence of mitral valve regurgitation.  4. The aortic valve is tricuspid. Aortic valve regurgitation is not visualized. No aortic stenosis is present.  5. Small pericardial effusion, measures up to 0.5cm adjacent to RV free wall at end diastole. No RV diastolic collapse seen.  6. The inferior vena cava is normal in size with <50% respiratory variability, suggesting right atrial pressure of 8 mmHg. FINDINGS  Left Ventricle: Left ventricular ejection fraction, by estimation, is 30 to 35%. The left ventricle has moderately decreased function. The left ventricle demonstrates regional wall motion abnormalities. The left  ventricular internal cavity size was normal in size. There is moderate left ventricular hypertrophy. Left ventricular diastolic parameters are indeterminate.  LV Wall Scoring: The mid and distal anterior wall, mid and distal anterior septum, mid inferoseptal segment, and apex are akinetic. The basal anteroseptal segment, basal anterior segment, and basal inferoseptal segment are hypokinetic. The entire lateral wall and entire inferior wall are normal. Right Ventricle: The right ventricular size is normal. Right vetricular wall thickness was not assessed. Right ventricular systolic function is normal. Left Atrium: Left atrial size was normal in size. Right Atrium: Right atrial size was normal in size. Pericardium: A small pericardial effusion is present. Mitral Valve: The mitral valve is normal in structure. No evidence of mitral valve regurgitation. Tricuspid Valve: The tricuspid valve is normal in structure. Tricuspid valve regurgitation is trivial. Aortic Valve: The aortic valve is tricuspid. Aortic valve regurgitation is not visualized. No aortic stenosis is present. Pulmonic Valve: The pulmonic valve was not well visualized. Pulmonic valve regurgitation is not visualized. Aorta: The aortic root is normal in size and structure. Venous: The inferior vena cava is normal in size with less than 50% respiratory variability, suggesting right atrial pressure of 8 mmHg. IAS/Shunts: The interatrial septum was not well visualized.  LEFT VENTRICLE PLAX 2D LVIDd:         4.80 cm  Diastology LVIDs:         3.70 cm  LV e' lateral:   6.85 cm/s LV PW:         1.30 cm  LV E/e' lateral: 10.6 LV IVS:        1.40 cm  LV e' medial:    7.29 cm/s LVOT diam:     2.20 cm  LV E/e' medial:  10.0 LV SV:         41 LV SV Index:   16 LVOT Area:     3.80 cm  RIGHT VENTRICLE RV Basal diam:  3.30 cm RV S prime:     13.50 cm/s TAPSE (M-mode): 2.2 cm LEFT ATRIUM             Index       RIGHT ATRIUM           Index LA diam:        4.00 cm 1.51  cm/m  RA Area:     12.30 cm LA Vol (A2C):   55.4 ml 20.93 ml/m RA Volume:   28.40  ml  10.73 ml/m LA Vol (A4C):   71.0 ml 26.82 ml/m LA Biplane Vol: 66.5 ml 25.12 ml/m  AORTIC VALVE LVOT Vmax:   69.20 cm/s LVOT Vmean:  48.500 cm/s LVOT VTI:    0.109 m  AORTA Ao Root diam: 2.90 cm MITRAL VALVE MV Area (PHT): 15.17 cm   SHUNTS MV Decel Time: 50 msec     Systemic VTI:  0.11 m MV E velocity: 72.80 cm/s  Systemic Diam: 2.20 cm MV A velocity: 71.10 cm/s MV E/A ratio:  1.02 Oswaldo Milian MD Electronically signed by Oswaldo Milian MD Signature Date/Time: 01/29/2020/10:46:34 AM    Final    ECHOCARDIOGRAM LIMITED  Result Date: 01/30/2020    ECHOCARDIOGRAM LIMITED REPORT   Patient Name:   [name] Date of Exam: 01/30/2020 Medical Rec #:  [id number]    Height:       76.0 in Accession #:    [id number]   Weight:       305.1 lb Date of Birth:  [birth year]    BSA:          2.651 m Patient Age:    84 years     BP:           114/72 mmHg Patient Gender: M            HR:           99 bpm. Exam Location:  Inpatient Procedure: Limited Echo and Limited Color Doppler                       STAT ECHO Reported to: Dr Jenkins Rouge on 01/30/2020 10:57:00 AM. Indications:    R55 Syncope; I31.3 Pericardial effusion  History:        Patient has prior history of Echocardiogram examinations, most                 recent 01/29/2020. CAD and Previous Myocardial Infarction; Risk                 Factors:Hypertension, Diabetes and Dyslipidemia.  Sonographer:    Jonelle Sidle Dance Referring Phys: [address] [place]  1. Septal apical and inferior apical hypokinesis . Left ventricular ejection fraction, by estimation, is 40 to 45%. The left ventricle has mildly decreased function. The left ventricle demonstrates regional wall motion abnormalities (see scoring diagram/findings for description). The left ventricular internal cavity size was mildly dilated.  2. Right ventricular systolic function is normal. The right ventricular size  is normal.  3. Left atrial size was moderately dilated.  4. The mitral valve is normal in structure. Trivial mitral valve regurgitation. No evidence of mitral stenosis.  5. The aortic valve is tricuspid. Aortic valve regurgitation is not visualized. No aortic stenosis is present.  6. The inferior vena cava is normal in size with greater than 50% respiratory variability, suggesting right atrial pressure of 3 mmHg. FINDINGS  Left Ventricle: Septal apical and inferior apical hypokinesis. Left ventricular ejection fraction, by estimation, is 40 to 45%. The left ventricle has mildly decreased function. The left ventricle demonstrates regional wall motion abnormalities. The left ventricular internal cavity size was mildly dilated. There is no left ventricular hypertrophy. Right Ventricle: The right ventricular size is normal. No increase in right ventricular wall thickness. Right ventricular systolic function is normal. Left Atrium: Left atrial size was moderately dilated. Right Atrium: Right atrial size was normal in size. Pericardium: Trivial pericardial effusion is present. The pericardial effusion is anterior to  the right ventricle and posterior to the left ventricle. Mitral Valve: The mitral valve is normal in structure. There is mild thickening of the mitral valve leaflet(s). Normal mobility of the mitral valve leaflets. Trivial mitral valve regurgitation. No evidence of mitral valve stenosis. Tricuspid Valve: The tricuspid valve is normal in structure. Tricuspid valve regurgitation is mild . No evidence of tricuspid stenosis. Aortic Valve: The aortic valve is tricuspid. Aortic valve regurgitation is not visualized. No aortic stenosis is present. Pulmonic Valve: The pulmonic valve was normal in structure. Pulmonic valve regurgitation is not visualized. No evidence of pulmonic stenosis. Aorta: The aortic root is normal in size and structure. Venous: The inferior vena cava is normal in size with greater than 50%  respiratory variability, suggesting right atrial pressure of 3 mmHg. IAS/Shunts: No atrial level shunt detected by color flow Doppler. LEFT VENTRICLE PLAX 2D LVIDd:         5.19 cm LVIDs:         3.68 cm LV PW:         1.33 cm LV IVS:        1.07 cm LVOT diam:     2.30 cm LVOT Area:     4.15 cm  RIGHT VENTRICLE         IVC TAPSE (M-mode): 2.2 cm  IVC diam: 2.43 cm LEFT ATRIUM             Index LA diam:        4.50 cm 1.70 cm/m LA Vol (A2C):   44.1 ml 16.64 ml/m LA Vol (A4C):   76.6 ml 28.90 ml/m LA Biplane Vol: 59.5 ml 22.45 ml/m   AORTA Ao Root diam: 3.10 cm Ao Asc diam:  3.10 cm  SHUNTS Systemic Diam: 2.30 cm Jenkins Rouge MD Electronically signed by Jenkins Rouge MD Signature Date/Time: 01/30/2020/11:00:36 AM    Final    Disposition   Pt is being discharged home today in good condition.  Follow-up Plans & Appointments    Follow-up Information    [name], MD Follow up on 02/05/2020.   Specialties: Cardiology, Radiology, Interventional Cardiology Why: 02/05/20 at 10:40 Contact information: [address] [place] [address] [phone]              Discharge Instructions    Amb Referral to Cardiac Rehabilitation   Complete by: As directed    Diagnosis:  Coronary Stents STEMI     After initial evaluation and assessments completed: Virtual Based Care may be provided alone or in conjunction with Phase 2 Cardiac Rehab based on patient barriers.: Yes   Ambulatory referral to Nutrition and Diabetic Education   Complete by: As directed    Call MD for:  difficulty breathing, headache or visual disturbances   Complete by: As directed    Call MD for:  extreme fatigue   Complete by: As directed    Call MD for:  hives   Complete by: As directed    Call MD for:  persistant dizziness or light-headedness   Complete by: As directed    Call MD for:  persistant nausea and vomiting   Complete by: As directed    Call MD for:  redness, tenderness, or signs of  infection (pain, swelling, redness, odor or green/yellow discharge around incision site)   Complete by: As directed    Call MD for:  severe uncontrolled pain   Complete by: As directed    Call MD for:  temperature >100.4  Complete by: As directed    Diet - low sodium heart healthy   Complete by: As directed    Discharge instructions   Complete by: As directed    PLEASE DO NOT MISS ANY DOSES OF YOUR PLAVIX!!!! Also keep a log of you blood pressures and bring back to your follow up appt. Please call the office with any questions.   Patients taking blood thinners should generally stay away from medicines like ibuprofen, Advil, Motrin, naproxen, and Aleve due to risk of stomach bleeding. You may take Tylenol as directed or talk to your primary doctor about alternatives.  Some studies suggest Prilosec/Omeprazole interacts with Plavix. If you have reflux symptoms, please use Protonix for less chance of interaction.   No driving for 3-5 days. No lifting over 5 lbs for 1 week. No sexual activity for 1 week. Keep procedure site clean & dry. If you notice increased pain, swelling, bleeding or pus, call/return!  You may shower, but no soaking baths/hot tubs/pools for 1 week.   One of your heart tests showed weakness of the heart muscle this admission. This may make you more susceptible to weight gain from fluid retention, which can lead to symptoms that we call heart failure. Please follow these special instructions:  1. Follow a low-salt diet - you are allowed no more than 2,000mg  of sodium per day. Watch your fluid intake. In general, you should not be taking in more than 2 liters of fluid per day (no more than 8 glasses per day). This includes sources of water in foods like soup, coffee, tea, milk, etc. 2. Weigh yourself on the same scale at same time of day and keep a log. 3. Call your doctor: (Anytime you feel any of the following symptoms)  - 3lb weight gain overnight or 5lb within a few days -  Shortness of breath, with or without a dry hacking cough  - Swelling in the hands, feet or stomach  - If you have to sleep on extra pillows at night in order to breathe   IT IS IMPORTANT TO LET YOUR DOCTOR KNOW EARLY ON IF YOU ARE HAVING SYMPTOMS SO WE CAN HELP YOU!   Increase activity slowly   Complete by: As directed      Discharge Medications   Allergies as of 02/01/2020   No Known Allergies     Medication List    STOP taking these medications   lisinopril 20 MG tablet Commonly known as: ZESTRIL   omeprazole 20 MG capsule Commonly known as: PRILOSEC     TAKE these medications   Accu-Chek Guide test strip Generic drug: glucose blood 1 each by Other route 3 (three) times daily.   aspirin 81 MG tablet Take 1 tablet (81 mg total) by mouth daily.   atorvastatin 80 MG tablet Commonly known as: LIPITOR Take 1 tablet (80 mg total) by mouth daily. Start taking on: February 02, 2020 What changed:   medication strength  how much to take   carvedilol 3.125 MG tablet Commonly known as: COREG Take 1 tablet (3.125 mg total) by mouth 2 (two) times daily with a meal.   clopidogrel 75 MG tablet Commonly known as: PLAVIX Take 1 tablet (75 mg total) by mouth daily. Start taking on: February 02, 2020   Lantus SoloStar 100 UNIT/ML Solostar Pen Generic drug: insulin glargine Inject 45-50 Units into the skin at bedtime.   NovoLOG FlexPen 100 UNIT/ML FlexPen Generic drug: insulin aspart Inject 5-10 Units into the skin See  admin instructions. Inject 5-10 units into the skin three times a day before meals, per sliding scale       Outstanding Labs/Studies   BMET in one week, order placed    Duration of Discharge Encounter   Greater than 30 minutes including physician time.  Signed, Kathyrn Drown, NP 02/01/2020, 10:27 AM

## 2020-02-01 NOTE — Progress Notes (Addendum)
Progress Note  Patient Name: DRAYLEN LOBUE Date of Encounter: 02/01/2020  Primary Cardiologist: Larae Grooms, MD  Subjective   Doing well today, no chest pain or SOB. BP improved   Inpatient Medications    Scheduled Meds: . aspirin  81 mg Oral Daily  . atorvastatin  80 mg Oral Daily  . carvedilol  3.125 mg Oral BID WC  . clopidogrel  75 mg Oral Daily  . insulin aspart  0-15 Units Subcutaneous TID WC  . insulin aspart  0-5 Units Subcutaneous QHS  . insulin aspart  12 Units Subcutaneous TID WC  . insulin glargine  60 Units Subcutaneous QHS  . pantoprazole  40 mg Oral Daily  . sodium chloride flush  3 mL Intravenous Q12H   Continuous Infusions: . sodium chloride     PRN Meds: sodium chloride, acetaminophen, morphine, ondansetron (ZOFRAN) IV, sodium chloride flush   Vital Signs    Vitals:   01/31/20 1928 01/31/20 2336 02/01/20 0335 02/01/20 0734  BP:  120/74 100/75 121/77  Pulse: 88 88 92 86  Resp: 20 18 18 18   Temp: 98.6 F (37 C) 98 F (36.7 C) 98.6 F (37 C) 99 F (37.2 C)  TempSrc: Oral Oral Oral Oral  SpO2: 98% 100% 98% 99%  Weight:   (!) 142 kg   Height:        Intake/Output Summary (Last 24 hours) at 02/01/2020 0738 Last data filed at 02/01/2020 4010 Gross per 24 hour  Intake 1080 ml  Output 3750 ml  Net -2670 ml   Filed Weights   01/30/20 0357 01/31/20 0655 02/01/20 0335  Weight: (!) 138.4 kg (!) 142 kg (!) 142 kg    Physical Exam   General: Well developed, well nourished, NAD Neck: Negative for carotid bruits. No JVD Lungs:Clear to ausculation bilaterally. Breathing is unlabored. Cardiovascular: RRR with S1 S2. No murmurs Abdomen: Soft, non-tender, non-distended. No obvious abdominal masses. Extremities: No edema. Radial pulses 2+ bilaterally Neuro: Alert and oriented. No focal deficits. No facial asymmetry. MAE spontaneously. Psych: Responds to questions appropriately with normal affect.    Labs    Chemistry Recent Labs  Lab  01/28/20 1242 01/28/20 1315 01/29/20 0731 01/29/20 0731 01/30/20 0830 01/31/20 0420 02/01/20 0446  NA 138   < > 133*   < > 134* 137 138  K 3.6   < > 4.2   < > 3.9 3.1* 3.4*  CL 103   < > 99   < > 100 102 105  CO2 24   < > 22   < > 22 22 21*  GLUCOSE 217*   < > 412*   < > 242* 115* 130*  BUN 19   < > 30*   < > 47* 56* 56*  CREATININE 2.28*   < > 3.41*   < > 6.22* 7.44* 5.64*  CALCIUM 9.0   < > 8.5*   < > 8.6* 8.6* 8.9  PROT 6.6  --  6.1*  --   --   --   --   ALBUMIN 2.7*  --  2.3*  --   --   --   --   AST 113*  --  63*  --   --   --   --   ALT 45*  --  35  --   --   --   --   ALKPHOS 93  --  88  --   --   --   --  BILITOT 1.7*  --  1.7*  --   --   --   --   GFRNONAA 36*   < > 22*   < > 11* 9* 12*  GFRAA 41*   < > 25*   < > 12* 10* 14*  ANIONGAP 11   < > 12   < > 12 13 12    < > = values in this interval not displayed.     Hematology Recent Labs  Lab 01/30/20 0830 01/31/20 0420 02/01/20 0446  WBC 14.2* 10.9* 10.4  RBC 3.50* 3.38* 3.54*  HGB 9.7* 9.5* 10.0*  HCT 30.1* 29.2* 30.6*  MCV 86.0 86.4 86.4  MCH 27.7 28.1 28.2  MCHC 32.2 32.5 32.7  RDW 13.0 12.7 12.6  PLT 213 211 258    Cardiac EnzymesNo results for input(s): TROPONINI in the last 168 hours. No results for input(s): TROPIPOC in the last 168 hours.   BNPNo results for input(s): BNP, PROBNP in the last 168 hours.   DDimer No results for input(s): DDIMER in the last 168 hours.   Radiology    US RENAL  Result Date: 01/30/2020 CLINICAL DATA:  Acute kidney injury. EXAM: RENAL / URINARY TRACT ULTRASOUND COMPLETE COMPARISON:  None. FINDINGS: Right Kidney: Renal measurements: 14.5 x 6.2 x 5.9 cm = volume: 280 mL. Echogenicity within normal limits. No mass or hydronephrosis visualized. Left Kidney: Renal measurements: 15.1 x 6.7 x 5.7 cm = volume: 310 mL. Echogenicity within normal limits. No mass or hydronephrosis visualized. Bladder: Appears normal for degree of bladder distention. No postvoid residual. Other:  None. IMPRESSION: Unremarkable sonographic appearance of the kidneys and bladder. Prominently sized kidneys are likely normal for patient's height. Electronically Signed   By: Keith Rake M.D.   On: 01/30/2020 22:51   ECHOCARDIOGRAM LIMITED  Result Date: 01/30/2020    ECHOCARDIOGRAM LIMITED REPORT   Patient Name:   JAMAURI KRUZEL Date of Exam: 01/30/2020 Medical Rec #:  [id number]    Height:       76.0 in Accession #:    [id number]   Weight:       305.1 lb Date of Birth:  04-16-1986    BSA:          2.651 m Patient Age:    34 years     BP:           114/72 mmHg Patient Gender: M            HR:           99 bpm. Exam Location:  Inpatient Procedure: Limited Echo and Limited Color Doppler                       STAT ECHO Reported to: Dr Jenkins Rouge on 01/30/2020 10:57:00 AM. Indications:    R55 Syncope; I31.3 Pericardial effusion  History:        Patient has prior history of Echocardiogram examinations, most                 recent 01/29/2020. CAD and Previous Myocardial Infarction; Risk                 Factors:Hypertension, Diabetes and Dyslipidemia.  Sonographer:    Jonelle Sidle Dance Referring Phys: 50 [place]  1. Septal apical and inferior apical hypokinesis . Left ventricular ejection fraction, by estimation, is 40 to 45%. The left ventricle has mildly decreased function. The left ventricle demonstrates regional wall motion abnormalities (see  scoring diagram/findings for description). The left ventricular internal cavity size was mildly dilated.  2. Right ventricular systolic function is normal. The right ventricular size is normal.  3. Left atrial size was moderately dilated.  4. The mitral valve is normal in structure. Trivial mitral valve regurgitation. No evidence of mitral stenosis.  5. The aortic valve is tricuspid. Aortic valve regurgitation is not visualized. No aortic stenosis is present.  6. The inferior vena cava is normal in size with greater than 50% respiratory variability, suggesting  right atrial pressure of 3 mmHg. FINDINGS  Left Ventricle: Septal apical and inferior apical hypokinesis. Left ventricular ejection fraction, by estimation, is 40 to 45%. The left ventricle has mildly decreased function. The left ventricle demonstrates regional wall motion abnormalities. The left ventricular internal cavity size was mildly dilated. There is no left ventricular hypertrophy. Right Ventricle: The right ventricular size is normal. No increase in right ventricular wall thickness. Right ventricular systolic function is normal. Left Atrium: Left atrial size was moderately dilated. Right Atrium: Right atrial size was normal in size. Pericardium: Trivial pericardial effusion is present. The pericardial effusion is anterior to the right ventricle and posterior to the left ventricle. Mitral Valve: The mitral valve is normal in structure. There is mild thickening of the mitral valve leaflet(s). Normal mobility of the mitral valve leaflets. Trivial mitral valve regurgitation. No evidence of mitral valve stenosis. Tricuspid Valve: The tricuspid valve is normal in structure. Tricuspid valve regurgitation is mild . No evidence of tricuspid stenosis. Aortic Valve: The aortic valve is tricuspid. Aortic valve regurgitation is not visualized. No aortic stenosis is present. Pulmonic Valve: The pulmonic valve was normal in structure. Pulmonic valve regurgitation is not visualized. No evidence of pulmonic stenosis. Aorta: The aortic root is normal in size and structure. Venous: The inferior vena cava is normal in size with greater than 50% respiratory variability, suggesting right atrial pressure of 3 mmHg. IAS/Shunts: No atrial level shunt detected by color flow Doppler. LEFT VENTRICLE PLAX 2D LVIDd:         5.19 cm LVIDs:         3.68 cm LV PW:         1.33 cm LV IVS:        1.07 cm LVOT diam:     2.30 cm LVOT Area:     4.15 cm  RIGHT VENTRICLE         IVC TAPSE (M-mode): 2.2 cm  IVC diam: 2.43 cm LEFT ATRIUM              Index LA diam:        4.50 cm 1.70 cm/m LA Vol (A2C):   44.1 ml 16.64 ml/m LA Vol (A4C):   76.6 ml 28.90 ml/m LA Biplane Vol: 59.5 ml 22.45 ml/m   AORTA Ao Root diam: 3.10 cm Ao Asc diam:  3.10 cm  SHUNTS Systemic Diam: 2.30 cm Jenkins Rouge MD Electronically signed by Jenkins Rouge MD Signature Date/Time: 01/30/2020/11:00:36 AM    Final    Telemetry    02/01/20 NSR- Personally Reviewed  ECG    No new tracing as of 02/01/20- Personally Reviewed  Cardiac Studies   2D echo 01/29/20 1. Left ventricular ejection fraction, by estimation, is 30 to 35%. The  left ventricle has moderately decreased function. The left ventricle  demonstrates regional wall motion abnormalities (see scoring  diagram/findings for description). Anterior/septal  akinesis. There is moderate left ventricular hypertrophy. Left ventricular  diastolic parameters are indeterminate.  2.  Right ventricular systolic function is normal. The right ventricular  size is normal.  3. The mitral valve is normal in structure. No evidence of mitral valve  regurgitation.  4. The aortic valve is tricuspid. Aortic valve regurgitation is not  visualized. No aortic stenosis is present.  5. Small pericardial effusion, measures up to 0.5cm adjacent to RV free  wall at end diastole. No RV diastolic collapse seen.  6. The inferior vena cava is normal in size with <50% respiratory  variability, suggesting right atrial pressure of 8 mmHg.   Limited echo 01/30/20  1. Septal apical and inferior apical hypokinesis . Left ventricular  ejection fraction, by estimation, is 40 to 45%. The left ventricle has  mildly decreased function. The left ventricle demonstrates regional wall  motion abnormalities (see scoring  diagram/findings for description). The left ventricular internal cavity  size was mildly dilated.  2. Right ventricular systolic function is normal. The right ventricular  size is normal.  3. Left atrial size was moderately  dilated.  4. The mitral valve is normal in structure. Trivial mitral valve  regurgitation. No evidence of mitral stenosis.  5. The aortic valve is tricuspid. Aortic valve regurgitation is not  visualized. No aortic stenosis is present.  6. The inferior vena cava is normal in size with greater than 50%  respiratory variability, suggesting right atrial pressure of 3 mmHg.   LHC 01/28/20 Prox LAD lesion is 100% stenosed. Late presenting anterior MI, as his symptoms have been going on for about 24 hours.  A drug-eluting stent was successfully placed using a SYNERGY XD [id number], postdilated to greater than 3 mm in diameter.  Post intervention, there is a 0% residual stenosis.  There is moderate to severe left ventricular systolic dysfunction with anterior wall hypokinesis.  LV end diastolic pressure is mildly elevated.  The left ventricular ejection fraction is 25-35% by visual estimate.  There is no aortic valve stenosis. Watch for 48 hours. Check echocardiogram. May need to consider LifeVest. Holding lisinopril at this time since his creatinine was elevated in the Cath Lab at the time of procedure. 1 year of dual antiplatelet therapy recommended along with aggressive secondary prevention  Patient Profile     34 y.o. male with obesity, IDDM, HTN, HLD, family history of CAD began having chest pain 1 day prior to admission. Due to persistent pain, came to the hospital where EKG demonstratedanterior Q waves and marked ST elevation.Cath showed 100% prox LAD (late presenting MI), s/p DES, LVEF 25-35%, mildly elevated LVEDP. 2D echo showed EF 30-35% with anterior/septal akinesis, normal RV function, small pericardial effusion. Also found to have AKI, uncontrolled DM, severe HLD.  Assessment & Plan    1. 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 -Stable clinically from a cardiac  standpoint however having issues with renal failure   2. Acute systolic CHF: -Initial LVEF found to be 30-35%, with repeat limited echo 01/30/20 showing improvement to 40-45% therefore no LifeVest needed (rep aware order cancelled) -Carvedilol decreased 01/31/20 secondary to orthostatic hypotension  -Hydralazine stopped 8/17 due to hypotension -Continue to avoid additional antihypertensive agents to avoid hypotension/hypoperfusion  -No ACEI/ARB/ARNI/spiro due to elevated creatinine  3. Oliguric acute renal failure superimposed on probable CKD stage II (prior Cr 1.2-1.3), present on admission -Cr 2.28 on arrival >>3.41>>6.22>>>7.44>>>>5.64 today with improved UO -Net negative 2.3L (3.7L UO yesterday)  -Renal US unremarkable  4. Essential HTN: -  Stable, 121/77>100/75>120/74 -Avoid hypotension secondary to #3 -Continue current regimen   5. Hyperlipidemia goal LDL <70 -LDL 176, trig 344 -Continue high dose atorvastatin -Recheck Lipid panel and LFTs at f/u   6. Insulin dependent DM: -Continue Lantus, meal coverage -Appreciate diabetes coordinator guidance this admission   7. Pericardial effusion: -Trivial by repeat echo 8/8  8. Leukocytosis/anemia:  -f/u labs show leukocytosis improving>>10.4 today  -Anemia improving rat 10.0 today    Signed, Kathyrn Drown NP-C [facility] Pager: [phone] 02/01/2020, 7:38 AM   For questions or updates, please contact   Please consult [email or web address] for contact info under Cardiology/STEMI.  Patient seen, examined. Available data reviewed. Agree with findings, assessment, and plan as outlined by Kathyrn Drown, NP-C.  The patient is alert and oriented, in no distress.  Lungs are clear, JVP is normal, heart is regular rate and rhythm with no murmur or gallop, abdomen is soft and nontender, extremities have no edema.  Fortunately the patient's creatinine has plateaued and is now trending down from 7.44 yesterday to 5.64 today.  His urine output  has greatly improved.  He remained stable from a cardiac perspective.  I think he is medically stable for hospital discharge today.  I will touch base with the nephrology team to make sure that we are all in agreement that he is ready.  I am sure he will need follow-up labs in the next week as an outpatient.  Discussed plan with patient and will begin working on his discharge needs.  He will be discharged on aspirin, clopidogrel, high intensity statin, and low-dose carvedilol.  His blood pressures are stable and we need to avoid hypotension in the setting of his acute kidney injury.  As previously documented, he is not a candidate for ACE/ARB at this time.  Sherren Mocha, M.D. 02/01/2020 9:50 AM

## 2020-02-01 NOTE — Progress Notes (Signed)
[facility] Progress Note   Adron Geisel is a 34 y.o. male with a pPMH of CAD, DM, HLD, HTN, who presented to [facility] on 08/16 with acute onset chest pain and diaphoresis. The chest pain started the day prior to presentation. He states that the pain was persistent but waxed and waned. He had associated nausea  IN the ED, he was found to have ischemic changes on his EKG and was sent for cardiac catheterization. He was found to have a 100% stenosis of his proximal LAD with significant anterior wall hypokinesis (EF of 25-35%) and is now s/p DES placement. Patient's Cr on presentation was 2.28 (GFR 41) w/o significant electrolyte abnormalities.   Subsequently he underwent TTE on the following day with minimal improvement of LV function (30-35%). He was stared on Brilliant and ACEI/ARB/Entresto/Aldosterone antagonists were held due to worsening Cr of 3.41 with 1 L urine output recorded. Patient had an episode of hypotension with associated n/v and syncope. Telemetry did not show any arrhythmias.   On evaluation today, patient statesthat he is feeling better today. He states that his beathing and energy have improved but does admits to some exertional dyspnea. He also admits to decreased urine output in the last 24 hours without associated dysuria or hematuria. His kidney function has significantly worsened since admission and is now at a Cr of 6.22 (GFR 12) without significant electrolyte abnormalities or uremic symptoms. Specifically he denies, change in appetite or taste, pruritis, nausea, or confusion.  Patient continues to have good urine output with improved kidney function today.   Assessment/ Plan:   1. Acute on chronic kidney disease- Patient had roughly 3.7 L of fluid output with improvement of his kidney function to 5.64 (GFR 14). It is unlikely that he will require HD during this hospitalization. He will need close follow up with nephrology for further evaluation and management  of his CKD. - Monitor renal function closely with daily BMP - No emergent need for HD at this time.  - Strict I&Os, daily weights, salt and fluid restricted diet.  - Patient is stable for discharge with close follow up.  2. Hypokalemia -  his potassium is lowe today at 3.4. - Replete potassium today.   3. 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 Hyperlipidemia: - Continue carvedilol  - Continue to hold nephrotoxic medications in the setting of worsening renal insufficiency  - Continue high intensity statin   6. Essential HTN: - Patient is normotensive today. Will hold off on antihypertensive medications.   8. Insulin dependent DM: - Continue current diabetes medications per cardiology.  Subjective:    Patient states that he is doing well today. He denies new or worsening symptoms. States that he feels good and is ready to go home. He denies chest pain,SHOB, abdominal pain, weakness/fatigue, loss of appetite.    Objective:   BP 119/90   Pulse 85   Temp 99 F (37.2 C) (Oral)   Resp 18   Ht 6\' 4"  (1.93 m)   Wt (!) 142 kg   SpO2 99%   BMI 38.11 kg/m   Intake/Output Summary (Last 24 hours) at 02/01/2020 0932 Last data filed at 02/01/2020 1610 Gross per 24 hour  Intake 1062 ml  Output 4550 ml  Net -3488 ml   Weight change: 0.024 kg  Physical Exam: Physical Exam Constitutional:      Appearance: Normal appearance. He is obese.  HENT:     Head: Normocephalic and  atraumatic.  Eyes:     Extraocular Movements: Extraocular movements intact.  Cardiovascular:     Rate and Rhythm: Normal rate.     Pulses: Normal pulses.     Heart sounds: Normal heart sounds.  Pulmonary:     Effort: Pulmonary effort is normal.     Breath sounds: Normal breath sounds.  Abdominal:     General: Bowel sounds are normal.     Palpations: Abdomen is soft.     Tenderness: There is no abdominal tenderness.  Musculoskeletal:        General: Normal range of motion.      Cervical back: Normal range of motion.     Right lower leg: No edema.     Left lower leg: No edema.  Skin:    General: Skin is warm and dry.  Neurological:     Mental Status: He is alert and oriented to person, place, and time. Mental status is at baseline.  Psychiatric:        Mood and Affect: Mood normal.     Imaging: US RENAL  Result Date: 01/30/2020 CLINICAL DATA:  Acute kidney injury. EXAM: RENAL / URINARY TRACT ULTRASOUND COMPLETE COMPARISON:  None. FINDINGS: Right Kidney: Renal measurements: 14.5 x 6.2 x 5.9 cm = volume: 280 mL. Echogenicity within normal limits. No mass or hydronephrosis visualized. Left Kidney: Renal measurements: 15.1 x 6.7 x 5.7 cm = volume: 310 mL. Echogenicity within normal limits. No mass or hydronephrosis visualized. Bladder: Appears normal for degree of bladder distention. No postvoid residual. Other: None. IMPRESSION: Unremarkable sonographic appearance of the kidneys and bladder. Prominently sized kidneys are likely normal for patient's height. Electronically Signed   By: Keith Rake M.D.   On: 01/30/2020 22:51   ECHOCARDIOGRAM LIMITED  Result Date: 01/30/2020    ECHOCARDIOGRAM LIMITED REPORT   Patient Name:   SHANDY VI Date of Exam: 01/30/2020 Medical Rec #:  [id number]    Height:       76.0 in Accession #:    [id number]   Weight:       305.1 lb Date of Birth:  January 05, 1986    BSA:          2.651 m Patient Age:    50 years     BP:           114/72 mmHg Patient Gender: M            HR:           99 bpm. Exam Location:  Inpatient Procedure: Limited Echo and Limited Color Doppler                       STAT ECHO Reported to: Dr Jenkins Rouge on 01/30/2020 10:57:00 AM. Indications:    R55 Syncope; I31.3 Pericardial effusion  History:        Patient has prior history of Echocardiogram examinations, most                 recent 01/29/2020. CAD and Previous Myocardial Infarction; Risk                 Factors:Hypertension, Diabetes and Dyslipidemia.  Sonographer:     Jonelle Sidle Dance Referring Phys: 20 [place]  1. Septal apical and inferior apical hypokinesis . Left ventricular ejection fraction, by estimation, is 40 to 45%. The left ventricle has mildly decreased function. The left ventricle demonstrates regional wall motion abnormalities (see scoring diagram/findings for description). The left  ventricular internal cavity size was mildly dilated.  2. Right ventricular systolic function is normal. The right ventricular size is normal.  3. Left atrial size was moderately dilated.  4. The mitral valve is normal in structure. Trivial mitral valve regurgitation. No evidence of mitral stenosis.  5. The aortic valve is tricuspid. Aortic valve regurgitation is not visualized. No aortic stenosis is present.  6. The inferior vena cava is normal in size with greater than 50% respiratory variability, suggesting right atrial pressure of 3 mmHg. FINDINGS  Left Ventricle: Septal apical and inferior apical hypokinesis. Left ventricular ejection fraction, by estimation, is 40 to 45%. The left ventricle has mildly decreased function. The left ventricle demonstrates regional wall motion abnormalities. The left ventricular internal cavity size was mildly dilated. There is no left ventricular hypertrophy. Right Ventricle: The right ventricular size is normal. No increase in right ventricular wall thickness. Right ventricular systolic function is normal. Left Atrium: Left atrial size was moderately dilated. Right Atrium: Right atrial size was normal in size. Pericardium: Trivial pericardial effusion is present. The pericardial effusion is anterior to the right ventricle and posterior to the left ventricle. Mitral Valve: The mitral valve is normal in structure. There is mild thickening of the mitral valve leaflet(s). Normal mobility of the mitral valve leaflets. Trivial mitral valve regurgitation. No evidence of mitral valve stenosis. Tricuspid Valve: The tricuspid valve is normal in  structure. Tricuspid valve regurgitation is mild . No evidence of tricuspid stenosis. Aortic Valve: The aortic valve is tricuspid. Aortic valve regurgitation is not visualized. No aortic stenosis is present. Pulmonic Valve: The pulmonic valve was normal in structure. Pulmonic valve regurgitation is not visualized. No evidence of pulmonic stenosis. Aorta: The aortic root is normal in size and structure. Venous: The inferior vena cava is normal in size with greater than 50% respiratory variability, suggesting right atrial pressure of 3 mmHg. IAS/Shunts: No atrial level shunt detected by color flow Doppler. LEFT VENTRICLE PLAX 2D LVIDd:         5.19 cm LVIDs:         3.68 cm LV PW:         1.33 cm LV IVS:        1.07 cm LVOT diam:     2.30 cm LVOT Area:     4.15 cm  RIGHT VENTRICLE         IVC TAPSE (M-mode): 2.2 cm  IVC diam: 2.43 cm LEFT ATRIUM             Index LA diam:        4.50 cm 1.70 cm/m LA Vol (A2C):   44.1 ml 16.64 ml/m LA Vol (A4C):   76.6 ml 28.90 ml/m LA Biplane Vol: 59.5 ml 22.45 ml/m   AORTA Ao Root diam: 3.10 cm Ao Asc diam:  3.10 cm  SHUNTS Systemic Diam: 2.30 cm Jenkins Rouge MD Electronically signed by Jenkins Rouge MD Signature Date/Time: 01/30/2020/11:00:36 AM    Final     Labs: BMET Recent Labs  Lab 01/28/20 1242 01/28/20 1315 01/29/20 0731 01/30/20 0830 01/31/20 0420 02/01/20 0446  NA 138 140 133* 134* 137 138  K 3.6 3.6 4.2 3.9 3.1* 3.4*  CL 103 102 99 100 102 105  CO2 24  --  22 22 22  21*  GLUCOSE 217* 206* 412* 242* 115* 130*  BUN 19 21* 30* 47* 56* 56*  CREATININE 2.28* 2.00* 3.41* 6.22* 7.44* 5.64*  CALCIUM 9.0  --  8.5* 8.6* 8.6* 8.9  CBC Recent Labs  Lab 01/28/20 1242 01/28/20 1315 01/29/20 0731 01/30/20 0830 01/31/20 0420 02/01/20 0446  WBC 14.8*   < > 17.5* 14.2* 10.9* 10.4  NEUTROABS 11.6*  --   --   --   --   --   HGB 12.0*   < > 10.5* 9.7* 9.5* 10.0*  HCT 36.5*   < > 31.8* 30.1* 29.2* 30.6*  MCV 86.3   < > 87.1 86.0 86.4 86.4  PLT 248   < > 214  213 211 258   < > = values in this interval not displayed.    Medications:    . aspirin  81 mg Oral Daily  . atorvastatin  80 mg Oral Daily  . carvedilol  3.125 mg Oral BID WC  . clopidogrel  75 mg Oral Daily  . insulin aspart  0-15 Units Subcutaneous TID WC  . insulin aspart  0-5 Units Subcutaneous QHS  . insulin aspart  12 Units Subcutaneous TID WC  . insulin glargine  60 Units Subcutaneous QHS  . pantoprazole  40 mg Oral Daily  . sodium chloride flush  3 mL Intravenous Q12H      Marianna Payment, D.O. [facility], PGY-2 Pager: [phone], Phone: [phone] Date 02/01/2020 Time 9:32 AM

## 2020-02-01 NOTE — Progress Notes (Signed)
CARDIAC REHAB PHASE I   Pt states he is feeling better today. States some intermittent lightheadedness, does not know if is from his blood pressure or blood sugars. Encouraged symptom monitoring at home. Reinforced importance of ASA and Plavix. Reviewed restrictions, site care, and exercise guidelines. Encouraged daily weights. Pt and wife deny further questions or concerns. Pt referred to [facility] [place].   [id number] Rufina Falco, RN BSN 02/01/2020 10:46 AM

## 2020-02-01 NOTE — Progress Notes (Signed)
Applied Freestyle Libre 2 CGM to patient in right upper arm and was started with supplied reader. Given information on how to use and explained to him that he would need a prescription from his PCP for further applications of CGM.  Patient understood instructions prior to his discharge from hospital.  Harvel Ricks RN BSN CDE Diabetes Coordinator Pager: [phone]  8am-5pm

## 2020-02-03 NOTE — Progress Notes (Signed)
Cardiology Office Note   Date:  02/05/2020   ID:  Greg, Richardson Jul 02, 1985, MRN [id number]  PCP:  [name], FNP    No chief complaint on file.  CAD/MI/ARF  Wt Readings from Last 3 Encounters:  02/05/20 (!) 306 lb 6.4 oz (139 kg)  02/01/20 (!) 313 lb 0.9 oz (142 kg)  08/01/19 295 lb (133.8 kg)       History of Present Illness: [name] is a 34 y.o. male  with with obesity, IDDM, HTN, HLD, family history of CAD  Who began having chest pain 1 day prior to admission. Due to persistent pain, came to the hospital where EKG demonstratedanterior Q waves and marked ST elevation.Cath showed 100% prox LAD (late presenting MI), s/p DES, LVEF 25-35%, mildly elevated LVEDP. 2D echo showed EF 30-35% with anterior/septal akinesis, normal RV function, small pericardial effusion. Also found to have AKI, uncontrolled DM, severe HLD.   He was not taking his BP readings or taking lisinopril regularly prior to the MI.   Cath results showed:  "Prox LAD lesion is 100% stenosed. Late presenting anterior MI, as his symptoms have been going on for about 24 hours.  A drug-eluting stent was successfully placed using a SYNERGY XD 2.50X24, postdilated to greater than 3 mm in diameter.  Post intervention, there is a 0% residual stenosis.  There is moderate to severe left ventricular systolic dysfunction with anterior wall hypokinesis.  LV end diastolic pressure is mildly elevated.  The left ventricular ejection fraction is 25-35% by visual estimate.  There is no aortic valve stenosis.   Watch for 48 hours.  Check echocardiogram.  May need to consider LifeVest.  Holding lisinopril at this time since his creatinine was elevated in the Cath Lab at the time of procedure.  1 year of dual antiplatelet therapy recommended along with aggressive secondary prevention."     Past Medical History:  Diagnosis Date  . Coronary artery disease   . Diabetes mellitus without  complication ([place])   . Hypercholesteremia   . Hypertension   . Myocardial infarction [facility]) 01/2020    Past Surgical History:  Procedure Laterality Date  . CORONARY STENT INTERVENTION N/A 01/28/2020   Procedure: CORONARY STENT INTERVENTION;  Surgeon: [name], MD;  Location: [place] [facility];  Service: Cardiovascular;  Laterality: N/A;  . CORONARY/GRAFT ACUTE MI REVASCULARIZATION N/A 01/28/2020   Procedure: Coronary/Graft Acute MI Revascularization;  Surgeon: [name], MD;  Location: [place] [facility];  Service: Cardiovascular;  Laterality: N/A;  . LEFT HEART CATH AND CORONARY ANGIOGRAPHY N/A 01/28/2020   Procedure: LEFT HEART CATH AND CORONARY ANGIOGRAPHY;  Surgeon: [name], MD;  Location: [place] [facility];  Service: Cardiovascular;  Laterality: N/A;     Current Outpatient Medications  Medication Sig Dispense Refill  . ACCU-CHEK GUIDE test strip 1 each by Other route 3 (three) times daily.   3  . aspirin 81 MG tablet Take 1 tablet (81 mg total) by mouth daily. 30 tablet 0  . atorvastatin (LIPITOR) 80 MG tablet Take 1 tablet (80 mg total) by mouth daily. 90 tablet 3  . carvedilol (COREG) 3.125 MG tablet Take 1 tablet (3.125 mg total) by mouth 2 (two) times daily with a meal. 120 tablet 3  . clopidogrel (PLAVIX) 75 MG tablet Take 1 tablet (75 mg total) by mouth daily. 90 tablet 3  . LANTUS SOLOSTAR 100 UNIT/ML Solostar Pen Inject 45-50 Units into the skin at  bedtime.     Marland Kitchen NOVOLOG FLEXPEN 100 UNIT/ML FlexPen Inject 5-10 Units into the skin See admin instructions. Inject 5-10 units into the skin three times a day before meals, per sliding scale  0   No current facility-administered medications for this visit.    Allergies:   Patient has no known allergies.    Social History:  The patient  reports that he has never smoked. He has never used smokeless tobacco. He reports that he does not drink alcohol and does not use drugs.   Family History:  The  patient's family history includes Diabetes in his father.    ROS:  Please see the history of present illness.   Otherwise, review of systems are positive for [facility] with BRilinta.   All other systems are reviewed and negative.    PHYSICAL EXAM: VS:  BP 130/78   Pulse 90   Ht 6\' 4"  (1.93 m)   Wt (!) 306 lb 6.4 oz (139 kg)   SpO2 98%   BMI 37.30 kg/m  , BMI Body mass index is 37.3 kg/m. GEN: Well nourished, well developed, in no acute distress  HEENT: normal  Neck: no JVD, carotid bruits, or masses Cardiac: RRR; no murmurs, rubs, or gallops,no edema  Respiratory:  clear to auscultation bilaterally, normal work of breathing GI: soft, nontender, nondistended, + BS MS: no deformity or atrophy ; bruising at the right radial site and forearm; small area of swelling proximal to skin insertion site Skin: warm and dry, no rash Neuro:  Strength and sensation are intact Psych: euthymic mood, full affect   EKG:   The ekg ordered 8/17 demonstrates NSR, anterior Q waves, persistent ST elevation   Recent Labs: 01/29/2020: ALT 35 01/31/2020: TSH 1.439 02/01/2020: BUN 56; Creatinine, Ser 5.64; Hemoglobin 10.0; Platelets 258; Potassium 3.4; Sodium 138   Lipid Panel    Component Value Date/Time   CHOL 290 (H) 01/29/2020 0731   TRIG 344 (H) 01/29/2020 0731   HDL 45 01/29/2020 0731   CHOLHDL 6.4 01/29/2020 0731   VLDL 69 (H) 01/29/2020 0731   LDLCALC 176 (H) 01/29/2020 0731   LDLDIRECT 118.2 (H) 01/28/2020 1242     Other studies Reviewed: Additional studies/ records that were reviewed today with results demonstrating: Hospital labs/records reviewed.   ASSESSMENT AND PLAN:  1.   CAD/ anterior wall MI:  No angina. Continue aggressive secondary prevention.  Brilinta was switched to Plavix due to shortness of breath. Recheck echo in 8 weeks. SL NTG.  EF40-45% so no lifevest was needed. 2.   Acute renal failure: Cr. Has been decreasing.  Will recheck.  Hopefully, it comes back to baseline.   Likely a combination of intrinsic renal disease, dye and low output due to low EF contributing.  Cr in the 5 range on 8/20- improving.  Check again today.  Has an appt with kidney MD in 2 weeks. No sx of volume overload. 3.   HTN: The current medical regimen is effective;  continue present plan and medications. 4.   Hyperlipidemia: High dose statin. Recheck lipids 8 weeks.  LDL target 70.  5.   DM: 10.1 A1C in 8/21.  Whole food plant based diet.  Increase fiber content.    Current medicines are reviewed at length with the patient today.  The patient concerns regarding his medicines were addressed.  The following changes have been made:  SL NTG  Labs/ tests ordered today include:  No orders of the defined types were placed in  this encounter.   Recommend 150 minutes/week of aerobic exercise Low fat, low carb, high fiber diet recommended  Disposition:   FU in 3 months   Signed, Larae Grooms, MD  02/05/2020 10:53 AM    [facility] [address], [place], [place]  [address] Phone: [phone]; Fax: [phone]

## 2020-02-05 ENCOUNTER — Encounter: Payer: Self-pay | Admitting: Interventional Cardiology

## 2020-02-05 ENCOUNTER — Ambulatory Visit (INDEPENDENT_AMBULATORY_CARE_PROVIDER_SITE_OTHER): Payer: 59 | Admitting: Interventional Cardiology

## 2020-02-05 ENCOUNTER — Other Ambulatory Visit: Payer: Self-pay

## 2020-02-05 VITALS — BP 130/78 | HR 90 | Ht 76.0 in | Wt 306.4 lb

## 2020-02-05 DIAGNOSIS — E1159 Type 2 diabetes mellitus with other circulatory complications: Secondary | ICD-10-CM

## 2020-02-05 DIAGNOSIS — I25118 Atherosclerotic heart disease of native coronary artery with other forms of angina pectoris: Secondary | ICD-10-CM

## 2020-02-05 DIAGNOSIS — I10 Essential (primary) hypertension: Secondary | ICD-10-CM | POA: Diagnosis not present

## 2020-02-05 DIAGNOSIS — Z794 Long term (current) use of insulin: Secondary | ICD-10-CM

## 2020-02-05 DIAGNOSIS — E782 Mixed hyperlipidemia: Secondary | ICD-10-CM

## 2020-02-05 DIAGNOSIS — N179 Acute kidney failure, unspecified: Secondary | ICD-10-CM

## 2020-02-05 LAB — BASIC METABOLIC PANEL
BUN/Creatinine Ratio: 18 (ref 9–20)
BUN: 43 mg/dL — ABNORMAL HIGH (ref 6–20)
CO2: 22 mmol/L (ref 20–29)
Calcium: 9.1 mg/dL (ref 8.7–10.2)
Chloride: 100 mmol/L (ref 96–106)
Creatinine, Ser: 2.37 mg/dL — ABNORMAL HIGH (ref 0.76–1.27)
GFR calc Af Amer: 39 mL/min/{1.73_m2} — ABNORMAL LOW (ref >59)
GFR calc non Af Amer: 34 mL/min/{1.73_m2} — ABNORMAL LOW (ref >59)
Glucose: 118 mg/dL — ABNORMAL HIGH (ref 65–99)
Potassium: 4 mmol/L (ref 3.5–5.2)
Sodium: 139 mmol/L (ref 134–144)

## 2020-02-05 NOTE — Patient Instructions (Signed)
Medication Instructions:  Your physician recommends that you continue on your current medications as directed. Please refer to the Current Medication list given to you today.  *If you need a refill on your cardiac medications before your next appointment, please call your pharmacy*   Lab Work: TODAY: BMET  Your physician recommends that you return for a FASTING lipid profile and LFTS on the same day as your echocardiogram in 8 weeks  If you have labs (blood work) drawn today and your tests are completely normal, you will receive your results only by: Marland Kitchen MyChart Message (if you have MyChart) OR . A paper copy in the mail If you have any lab test that is abnormal or we need to change your treatment, we will call you to review the results.   Testing/Procedures: Your physician has requested that you have an echocardiogram in 8 weeks. Echocardiography is a painless test that uses sound waves to create images of your heart. It provides your doctor with information about the size and shape of your heart and how well your heart's chambers and valves are working. This procedure takes approximately one hour. There are no restrictions for this procedure.     Follow-Up: At [facility], you and your health needs are our priority.  As part of our continuing mission to provide you with exceptional heart care, we have created designated Provider Care Teams.  These Care Teams include your primary Cardiologist (physician) and Advanced Practice Providers (APPs -  Physician Assistants and Nurse Practitioners) who all work together to provide you with the care you need, when you need it.  We recommend signing up for the patient portal called "MyChart".  Sign up information is provided on this After Visit Summary.  MyChart is used to connect with patients for Virtual Visits (Telemedicine).  Patients are able to view lab/test results, encounter notes, upcoming appointments, etc.  Non-urgent messages can be sent  to your provider as well.   To learn more about what you can do with MyChart, go to [email or web address].    Your next appointment:   3 month(s)  The format for your next appointment:   In Person  Provider:   You may see Larae Grooms, MD or one of the following Advanced Practice Providers on your designated Care Team:    Melina Copa, PA-C  Ermalinda Barrios, PA-C    Other Instructions  High-Fiber Diet Fiber, also called dietary fiber, is a type of carbohydrate that is found in fruits, vegetables, whole grains, and beans. A high-fiber diet can have many health benefits. Your health care provider may recommend a high-fiber diet to help:  Prevent constipation. Fiber can make your bowel movements more regular.  Lower your cholesterol.  Relieve the following conditions: ? Swelling of veins in the anus (hemorrhoids). ? Swelling and irritation (inflammation) of specific areas of the digestive tract (uncomplicated diverticulosis). ? A problem of the large intestine (colon) that sometimes causes pain and diarrhea (irritable bowel syndrome, IBS).  Prevent overeating as part of a weight-loss plan.  Prevent heart disease, type 2 diabetes, and certain cancers. What is my plan? The recommended daily fiber intake in grams (g) includes:  38 g for men age 91 or younger.  30 g for men over age 88.  49 g for women age 68 or younger.  21 g for women over age 40. You can get the recommended daily intake of dietary fiber by:  Eating a variety of fruits, vegetables, grains, and beans.  Taking  a fiber supplement, if it is not possible to get enough fiber through your diet. What do I need to know about a high-fiber diet?  It is better to get fiber through food sources rather than from fiber supplements. There is not a lot of research about how effective supplements are.  Always check the fiber content on the nutrition facts label of any prepackaged food. Look for foods that contain 5  g of fiber or more per serving.  Talk with a diet and nutrition specialist (dietitian) if you have questions about specific foods that are recommended or not recommended for your medical condition, especially if those foods are not listed below.  Gradually increase how much fiber you consume. If you increase your intake of dietary fiber too quickly, you may have bloating, cramping, or gas.  Drink plenty of water. Water helps you to digest fiber. What are tips for following this plan?  Eat a wide variety of high-fiber foods.  Make sure that half of the grains that you eat each day are whole grains.  Eat breads and cereals that are made with whole-grain flour instead of refined flour or white flour.  Eat brown rice, bulgur wheat, or millet instead of white rice.  Start the day with a breakfast that is high in fiber, such as a cereal that contains 5 g of fiber or more per serving.  Use beans in place of meat in soups, salads, and pasta dishes.  Eat high-fiber snacks, such as berries, raw vegetables, nuts, and popcorn.  Choose whole fruits and vegetables instead of processed forms like juice or sauce. What foods can I eat?  Fruits Berries. Pears. Apples. Oranges. Avocado. Prunes and raisins. Dried figs. Vegetables Sweet potatoes. Spinach. Kale. Artichokes. Cabbage. Broccoli. Cauliflower. Green peas. Carrots. Squash. Grains Whole-grain breads. Multigrain cereal. Oats and oatmeal. Brown rice. Barley. Bulgur wheat. [place]. Quinoa. Bran muffins. Popcorn. Rye wafer crackers. Meats and other proteins Navy, kidney, and pinto beans. Soybeans. Split peas. Lentils. Nuts and seeds. Dairy Fiber-fortified yogurt. Beverages Fiber-fortified soy milk. Fiber-fortified orange juice. Other foods Fiber bars. The items listed above may not be a complete list of recommended foods and beverages. Contact a dietitian for more options. What foods are not recommended? Fruits Fruit juice. Cooked, strained  fruit. Vegetables Fried potatoes. Canned vegetables. Well-cooked vegetables. Grains White bread. Pasta made with refined flour. White rice. Meats and other proteins Fatty cuts of meat. Fried chicken or fried fish. Dairy Milk. Yogurt. Cream cheese. Sour cream. Fats and oils Butters. Beverages Soft drinks. Other foods Cakes and pastries. The items listed above may not be a complete list of foods and beverages to avoid. Contact a dietitian for more information. Summary  Fiber is a type of carbohydrate. It is found in fruits, vegetables, whole grains, and beans.  There are many health benefits of eating a high-fiber diet, such as preventing constipation, lowering blood cholesterol, helping with weight loss, and reducing your risk of heart disease, diabetes, and certain cancers.  Gradually increase your intake of fiber. Increasing too fast can result in cramping, bloating, and gas. Drink plenty of water while you increase your fiber.  The best sources of fiber include whole fruits and vegetables, whole grains, nuts, seeds, and beans. This information is not intended to replace advice given to you by your health care provider. Make sure you discuss any questions you have with your health care provider. Document Revised: 04/04/2017 Document Reviewed: 04/04/2017 Elsevier Patient Education  2020 [employer].

## 2020-02-21 ENCOUNTER — Encounter (HOSPITAL_COMMUNITY): Payer: Self-pay

## 2020-02-21 ENCOUNTER — Telehealth (HOSPITAL_COMMUNITY): Payer: Self-pay

## 2020-02-21 NOTE — Telephone Encounter (Signed)
Attempted to call patient in regards to Cardiac Rehab - unable to leave VM.   Mailed letter 

## 2020-03-06 ENCOUNTER — Telehealth (HOSPITAL_COMMUNITY): Payer: Self-pay

## 2020-03-06 NOTE — Telephone Encounter (Signed)
No response from pt regarding CR.  Closed referral.  

## 2020-04-01 ENCOUNTER — Other Ambulatory Visit: Payer: Self-pay

## 2020-04-01 ENCOUNTER — Other Ambulatory Visit: Payer: 59 | Admitting: *Deleted

## 2020-04-01 ENCOUNTER — Ambulatory Visit (HOSPITAL_COMMUNITY): Payer: 59 | Attending: Cardiovascular Disease

## 2020-04-01 DIAGNOSIS — I25118 Atherosclerotic heart disease of native coronary artery with other forms of angina pectoris: Secondary | ICD-10-CM | POA: Insufficient documentation

## 2020-04-01 DIAGNOSIS — I10 Essential (primary) hypertension: Secondary | ICD-10-CM

## 2020-04-01 DIAGNOSIS — N179 Acute kidney failure, unspecified: Secondary | ICD-10-CM

## 2020-04-01 DIAGNOSIS — E782 Mixed hyperlipidemia: Secondary | ICD-10-CM

## 2020-04-01 DIAGNOSIS — E1159 Type 2 diabetes mellitus with other circulatory complications: Secondary | ICD-10-CM

## 2020-04-01 DIAGNOSIS — Z794 Long term (current) use of insulin: Secondary | ICD-10-CM

## 2020-04-01 LAB — COMPREHENSIVE METABOLIC PANEL
ALT: 27 IU/L (ref 0–44)
AST: 30 IU/L (ref 0–40)
Albumin/Globulin Ratio: 1.5 (ref 1.2–2.2)
Albumin: 4.3 g/dL (ref 4.0–5.0)
Alkaline Phosphatase: 133 IU/L — ABNORMAL HIGH (ref 44–121)
BUN/Creatinine Ratio: 18 (ref 9–20)
BUN: 39 mg/dL — ABNORMAL HIGH (ref 6–20)
Bilirubin Total: 0.6 mg/dL (ref 0.0–1.2)
CO2: 20 mmol/L (ref 20–29)
Calcium: 9.9 mg/dL (ref 8.7–10.2)
Chloride: 103 mmol/L (ref 96–106)
Creatinine, Ser: 2.17 mg/dL — ABNORMAL HIGH (ref 0.76–1.27)
GFR calc Af Amer: 44 mL/min/{1.73_m2} — ABNORMAL LOW (ref >59)
GFR calc non Af Amer: 38 mL/min/{1.73_m2} — ABNORMAL LOW (ref >59)
Globulin, Total: 2.9 g/dL (ref 1.5–4.5)
Glucose: 107 mg/dL — ABNORMAL HIGH (ref 65–99)
Potassium: 4 mmol/L (ref 3.5–5.2)
Sodium: 144 mmol/L (ref 134–144)
Total Protein: 7.2 g/dL (ref 6.0–8.5)

## 2020-04-01 LAB — LIPID PANEL
Chol/HDL Ratio: 5.5 ratio — ABNORMAL HIGH (ref 0.0–5.0)
Cholesterol, Total: 131 mg/dL (ref 100–199)
HDL: 24 mg/dL — ABNORMAL LOW (ref >39)
LDL Chol Calc (NIH): 67 mg/dL (ref 0–99)
Triglycerides: 242 mg/dL — ABNORMAL HIGH (ref 0–149)
VLDL Cholesterol Cal: 40 mg/dL (ref 5–40)

## 2020-04-01 LAB — ECHOCARDIOGRAM COMPLETE
Area-P 1/2: 6.54 cm2
S' Lateral: 3.4 cm

## 2020-04-01 MED ORDER — PERFLUTREN LIPID MICROSPHERE
1.0000 mL | INTRAVENOUS | Status: AC | PRN
  Administered 2020-04-01: 2 mL via INTRAVENOUS

## 2020-04-02 ENCOUNTER — Telehealth: Payer: Self-pay | Admitting: Pharmacist

## 2020-04-02 ENCOUNTER — Other Ambulatory Visit: Payer: Self-pay

## 2020-04-02 MED ORDER — VASCEPA 1 G PO CAPS: 2.0000 g | ORAL_CAPSULE | Freq: Two times a day (BID) | ORAL | 3 refills | Status: DC

## 2020-04-02 NOTE — Telephone Encounter (Signed)
Leeroy Bock, RPH-CPP  04/02/2020 7:38 AM EDT Back to Top    Yes, recommend adding Vascepa 2g twice daily for TG lowering and CV risk reduction. His A1c is uncontrolled at 10.1% which is also likely contributing to elevated TG. I'll give him a call to add Vascepa and get him set up with $9 copay card.   [name], MD  04/01/2020 6:06 PM EDT     Kidney function somewhat improved since prior test.   TG mildly elevated. Vascepa candidate?

## 2020-04-02 NOTE — Telephone Encounter (Signed)
Rx sent to pharmacy, called pharmacy but was advised that drug was not covered. Prior auth for [name] has been submitted. Will reach out to pt once approved.

## 2020-04-07 NOTE — Telephone Encounter (Signed)
Vascepa denied since generic is preferred but still requires prior authorization. New authorization has been submitted.

## 2020-04-09 NOTE — Telephone Encounter (Signed)
icosapent ethyl was denied. Insurance wants patient to use omega 3 acid (lovaza) first. I do not see much benefit in using lovaza as there are no cardiovascular protective effects. His TG are elevated most likely due to his elevated BG. I think it would be best for patient to work on his blood sugar control.  If his TG are still elevated after lifestyle modifications- could consider fenofibrate for reduction in micro-complications of DM.   Maybe he could be enrolled in Dr. Harrington Challenger dietary education project?

## 2020-04-10 NOTE — Telephone Encounter (Signed)
I think the dietary program is a great idea.  THanks.  JV

## 2020-04-10 NOTE — Telephone Encounter (Signed)
Patient is returning call.  °

## 2020-04-10 NOTE — Telephone Encounter (Addendum)
Tried to call pt to discuss with patient that his insurance will not pay for Vascepa. He should focus on controlling his blood sugars, low carb diet, avoiding saturated fats, but mail box was full. Will try again later

## 2020-04-10 NOTE — Telephone Encounter (Signed)
Spoke with patient. He states that since he got out of the hospital his diet has been much better. His blood sugars have been the in th 110s and he got off insulin. I congratulated him on the work and acknowledged that his TG have improved since august. Will continue lifestyle modifications and recheck in 2 months. Then if still high will consider fenofibrate.

## 2020-05-17 ENCOUNTER — Emergency Department (HOSPITAL_COMMUNITY): Payer: 59

## 2020-05-17 ENCOUNTER — Emergency Department (HOSPITAL_COMMUNITY)
Admission: EM | Admit: 2020-05-17 | Discharge: 2020-05-17 | Discharge status: Against Medical Advice | Payer: 59 | Attending: Emergency Medicine | Admitting: Emergency Medicine

## 2020-05-17 ENCOUNTER — Other Ambulatory Visit: Payer: Self-pay

## 2020-05-17 ENCOUNTER — Encounter (HOSPITAL_COMMUNITY): Payer: Self-pay | Admitting: Emergency Medicine

## 2020-05-17 DIAGNOSIS — I2510 Atherosclerotic heart disease of native coronary artery without angina pectoris: Secondary | ICD-10-CM | POA: Insufficient documentation

## 2020-05-17 DIAGNOSIS — R079 Chest pain, unspecified: Secondary | ICD-10-CM | POA: Diagnosis present

## 2020-05-17 DIAGNOSIS — Z7902 Long term (current) use of antithrombotics/antiplatelets: Secondary | ICD-10-CM | POA: Diagnosis not present

## 2020-05-17 DIAGNOSIS — Z79899 Other long term (current) drug therapy: Secondary | ICD-10-CM | POA: Insufficient documentation

## 2020-05-17 DIAGNOSIS — I10 Essential (primary) hypertension: Secondary | ICD-10-CM | POA: Insufficient documentation

## 2020-05-17 DIAGNOSIS — R202 Paresthesia of skin: Secondary | ICD-10-CM | POA: Diagnosis not present

## 2020-05-17 DIAGNOSIS — E876 Hypokalemia: Secondary | ICD-10-CM | POA: Diagnosis not present

## 2020-05-17 DIAGNOSIS — Z794 Long term (current) use of insulin: Secondary | ICD-10-CM | POA: Diagnosis not present

## 2020-05-17 DIAGNOSIS — R61 Generalized hyperhidrosis: Secondary | ICD-10-CM | POA: Diagnosis not present

## 2020-05-17 DIAGNOSIS — Z7982 Long term (current) use of aspirin: Secondary | ICD-10-CM | POA: Insufficient documentation

## 2020-05-17 DIAGNOSIS — Z955 Presence of coronary angioplasty implant and graft: Secondary | ICD-10-CM | POA: Insufficient documentation

## 2020-05-17 DIAGNOSIS — Z5320 Procedure and treatment not carried out because of patient's decision for unspecified reasons: Secondary | ICD-10-CM | POA: Diagnosis not present

## 2020-05-17 DIAGNOSIS — E119 Type 2 diabetes mellitus without complications: Secondary | ICD-10-CM | POA: Insufficient documentation

## 2020-05-17 LAB — CBC
HCT: 40.3 % (ref 39.0–52.0)
Hemoglobin: 12.5 g/dL — ABNORMAL LOW (ref 13.0–17.0)
MCH: 27.8 pg (ref 26.0–34.0)
MCHC: 31 g/dL (ref 30.0–36.0)
MCV: 89.6 fL (ref 80.0–100.0)
Platelets: 265 10*3/uL (ref 150–400)
RBC: 4.5 MIL/uL (ref 4.22–5.81)
RDW: 14.3 % (ref 11.5–15.5)
WBC: 9.8 10*3/uL (ref 4.0–10.5)
nRBC: 0 % (ref 0.0–0.2)

## 2020-05-17 LAB — BASIC METABOLIC PANEL
Anion gap: 13 (ref 5–15)
BUN: 22 mg/dL — ABNORMAL HIGH (ref 6–20)
CO2: 24 mmol/L (ref 22–32)
Calcium: 9.4 mg/dL (ref 8.9–10.3)
Chloride: 107 mmol/L (ref 98–111)
Creatinine, Ser: 1.46 mg/dL — ABNORMAL HIGH (ref 0.61–1.24)
GFR, Estimated: >60 mL/min (ref >60)
Glucose, Bld: 48 mg/dL — ABNORMAL LOW (ref 70–99)
Potassium: 3 mmol/L — ABNORMAL LOW (ref 3.5–5.1)
Sodium: 144 mmol/L (ref 135–145)

## 2020-05-17 LAB — TROPONIN I (HIGH SENSITIVITY)
Troponin I (High Sensitivity): 11 ng/L (ref <18)
Troponin I (High Sensitivity): 11 ng/L (ref <18)

## 2020-05-17 MED ORDER — POTASSIUM CHLORIDE CRYS ER 20 MEQ PO TBCR: 20.0000 meq | EXTENDED_RELEASE_TABLET | Freq: Once | ORAL | 0 refills | Status: DC

## 2020-05-17 MED ORDER — POTASSIUM CHLORIDE CRYS ER 20 MEQ PO TBCR
40.0000 meq | EXTENDED_RELEASE_TABLET | Freq: Once | ORAL | Status: AC
  Administered 2020-05-17: 40 meq via ORAL
  Filled 2020-05-17: qty 2

## 2020-05-17 NOTE — Discharge Instructions (Signed)
You understand that by leaving his medical advice you stand a chance that severe disability or even death.  Please make sure to return to the ER if your symptoms occur again.  Please take the potassium I prescribed daily.  Please make sure to keep your appointment with Dr. For nausea.

## 2020-05-17 NOTE — ED Notes (Signed)
DC instructions reviewed with pt. PT verbalized understanding. Pt DC °

## 2020-05-17 NOTE — ED Notes (Signed)
Got patient on the monitor patient is resting with call bell in reach  ?

## 2020-05-17 NOTE — ED Triage Notes (Signed)
Pt reports pressure to center of chest, mild SOB, and diaphoresis x 2 hours. Pain 3/10.

## 2020-05-17 NOTE — ED Provider Notes (Signed)
[facility] EMERGENCY DEPARTMENT Provider Note   CSN: [id number] Arrival date & time: 05/17/20  0844     History Chief Complaint  Patient presents with  . Chest Pain    [name] is a 34 y.o. male.  HPI 34 year old male with history of CAD, DM type II, hypercholesteremia, hypertension, MI 3 months ago with stent placement presents to the ER with complaints of left-sided chest pain.  Patient states that he woke up at around 5 AM with a dull aching left-sided pain to his left chest which was also companied with some tingling in his left hand.  He also woke up diaphoretic.  He denied any chest pain at the time.  Chest pain continued for several hours, until he arrived to the ER which was around 8 AM.  Given his history, he was concerned for MI.  He did not take any medications for his symptoms.  He has been compliant with his medications.  He is followed by Dr. Irish Lack.  Denies any fevers or chills.  Denies any recent drug or alcohol use.  Denies any cough, shortness of breath, syncope, nausea, vomiting.  Currently his pain is at a 0, at its worst he rates it a 5.    Past Medical History:  Diagnosis Date  . Coronary artery disease   . Diabetes mellitus without complication ([place])   . Hypercholesteremia   . Hypertension   . Myocardial infarction [facility]) 01/2020    Patient Active Problem List   Diagnosis Date Noted  . Acute anterior wall MI ([place]) 01/28/2020  . Gastroenteritis 07/20/2018  . AKI (acute kidney injury) ([place]) 07/19/2018  . Chest pain 07/19/2018  . Hyperlipidemia 01/14/2015  . Obesity (BMI 30-39.9) 01/14/2015  . Insulin-requiring or dependent type II diabetes mellitus ([place]) 01/14/2015  . Dyslipidemia 08/31/2014  . HTN (hypertension) 08/31/2014  . Diabetes ([place]) 08/15/2014    Past Surgical History:  Procedure Laterality Date  . CORONARY STENT INTERVENTION N/A 01/28/2020   Procedure: CORONARY STENT INTERVENTION;  Surgeon: [name], MD;  Location: [facility];  Service: Cardiovascular;  Laterality: N/A;  . CORONARY/GRAFT ACUTE MI REVASCULARIZATION N/A 01/28/2020   Procedure: Coronary/Graft Acute MI Revascularization;  Surgeon: [name], MD;  Location: [facility] CV LAB;  Service: Cardiovascular;  Laterality: N/A;  . LEFT HEART CATH AND CORONARY ANGIOGRAPHY N/A 01/28/2020   Procedure: LEFT HEART CATH AND CORONARY ANGIOGRAPHY;  Surgeon: [name], MD;  Location: [facility] CV LAB;  Service: Cardiovascular;  Laterality: N/A;       Family History  Problem Relation Age of Onset  . Diabetes Father     Social History   Tobacco Use  . Smoking status: Never Smoker  . Smokeless tobacco: Never Used  Vaping Use  . Vaping Use: Never used  Substance Use Topics  . Alcohol use: No  . Drug use: No    Home Medications Prior to Admission medications   Medication Sig Start Date End Date Taking? Authorizing Provider  aspirin 81 MG tablet Take 1 tablet (81 mg total) by mouth daily. 08/25/17  Yes Dong, Robin, DO  atorvastatin (LIPITOR) 80 MG tablet Take 1 tablet (80 mg total) by mouth daily. 02/02/20  Yes Kathyrn Drown D, NP  carvedilol (COREG) 3.125 MG tablet Take 1 tablet (3.125 mg total) by mouth 2 (two) times daily with a meal. 02/01/20  Yes Kathyrn Drown D, NP  clopidogrel (PLAVIX) 75 MG tablet Take 1 tablet (75 mg  total) by mouth daily. 02/02/20  Yes Kathyrn Drown D, NP  LANTUS SOLOSTAR 100 UNIT/ML Solostar Pen Inject 45-50 Units into the skin at bedtime.  08/09/14  Yes [provider]  metFORMIN (GLUCOPHAGE-XR) 500 MG 24 hr tablet Take 500 mg by mouth 2 (two) times daily. 02/21/20  Yes [provider]  NOVOLOG FLEXPEN 100 UNIT/ML FlexPen Inject 5-10 Units into the skin See admin instructions. Inject 5-10 units into the skin three times a day before meals, per sliding scale 02/24/18  Yes [provider]  potassium chloride SA (KLOR-CON) 20 MEQ tablet Take 1 tablet (20 mEq  total) by mouth once for 1 dose. 05/17/20 05/17/20  Garald Balding, PA-C    Allergies    Patient has no known allergies.  Review of Systems   Review of Systems  Constitutional: Negative for chills and fever.  HENT: Negative for ear pain and sore throat.   Eyes: Negative for pain and visual disturbance.  Respiratory: Positive for chest tightness. Negative for cough, shortness of breath and wheezing.   Cardiovascular: Positive for chest pain. Negative for palpitations.  Gastrointestinal: Negative for abdominal pain and vomiting.  Genitourinary: Negative for dysuria and hematuria.  Musculoskeletal: Negative for arthralgias and back pain.  Skin: Negative for color change and rash.  Neurological: Negative for seizures and syncope.  All other systems reviewed and are negative.   Physical Exam Updated Vital Signs BP 128/84   Pulse 77   Temp (!) 97.5 F (36.4 C) (Oral)   Resp 15   Ht 6\' 4"  (1.93 m)   Wt 131.5 kg   SpO2 100%   BMI 35.30 kg/m   Physical Exam Vitals and nursing note reviewed.  Constitutional:      General: He is not in acute distress.    Appearance: He is well-developed. He is obese. He is not ill-appearing, toxic-appearing or diaphoretic.     Comments: Well-appearing, nondiaphoretic, speaking full sentences without increased work of breathing  HENT:     Head: Normocephalic and atraumatic.  Eyes:     Conjunctiva/sclera: Conjunctivae normal.  Cardiovascular:     Rate and Rhythm: Normal rate and regular rhythm.     Pulses:          Radial pulses are 2+ on the right side and 2+ on the left side.     Heart sounds: Normal heart sounds. No murmur heard.   Pulmonary:     Effort: Pulmonary effort is normal. No respiratory distress.     Breath sounds: Normal breath sounds. No decreased breath sounds or wheezing.  Abdominal:     Palpations: Abdomen is soft.     Tenderness: There is no abdominal tenderness.  Musculoskeletal:        General: Normal range of motion.       Cervical back: Neck supple.     Right lower leg: No tenderness. No edema.     Left lower leg: No tenderness. No edema.  Skin:    General: Skin is warm and dry.  Neurological:     General: No focal deficit present.     Mental Status: He is alert.  Psychiatric:        Mood and Affect: Mood normal.        Behavior: Behavior normal.     ED Results / Procedures / Treatments   Labs (all labs ordered are listed, but only abnormal results are displayed) Labs Reviewed  BASIC METABOLIC PANEL - Abnormal; Notable for the following components:  Result Value   Potassium 3.0 (*)    Glucose, Bld 48 (*)    BUN 22 (*)    Creatinine, Ser 1.46 (*)    All other components within normal limits  CBC - Abnormal; Notable for the following components:   Hemoglobin 12.5 (*)    All other components within normal limits  RESP PANEL BY RT-PCR (FLU A&B, COVID) ARPGX2  TROPONIN I (HIGH SENSITIVITY)  TROPONIN I (HIGH SENSITIVITY)    EKG EKG Interpretation  Date/Time:  Saturday May 17 2020 08:56:02 EST Ventricular Rate:  82 PR Interval:  158 QRS Duration: 94 QT Interval:  422 QTC Calculation: 493 R Axis:   148 Text Interpretation: Normal sinus rhythm Right axis deviation Septal infarct , age undetermined Abnormal ECG no stemi Confirmed by Madalyn Rob [phone]) on 05/17/2020 11:03:13 AM   Radiology DG Chest 2 View  Result Date: 05/17/2020 CLINICAL DATA:  Chest pain and shortness of breath for a few hours EXAM: CHEST - 2 VIEW COMPARISON:  01/28/2020 FINDINGS: The heart size and mediastinal contours are within normal limits. Both lungs are clear. The visualized skeletal structures are unremarkable. IMPRESSION: No active cardiopulmonary disease. Electronically Signed   By: Inez Catalina M.D.   On: 05/17/2020 09:30    Procedures Procedures (including critical care time)  Medications Ordered in ED Medications  potassium chloride SA (KLOR-CON) CR tablet 40 mEq (40 mEq Oral Given  05/17/20 1214)    ED Course  I have reviewed the triage vital signs and the nursing notes.  Pertinent labs & imaging results that were available during my care of the patient were reviewed by me and considered in my medical decision making (see chart for details).  Clinical Course as of May 17 1502  Sat May 17, 2020  1500 EKG 12-Lead [RD]    Clinical Course User Index [RD] Lucrezia Starch, MD   MDM Rules/Calculators/A&P                          34 year old male presents to the ER with left-sided chest pain.  Currently asymptomatic here in the ER.  Vitals on arrival with some hypertension with a blood pressure 168/95, however not tachycardic, tachypneic or hypoxic.   DDx includes ACS, PE, dissection, musculoskeletal pain, GERD  Labs reviewed and interpreted by me -CBC largely unremarkable, hemoglobin of 12.5 which appears to be stable -BMP with hypokalemia of 3, elevated creatinine but at baseline -Delta troponins negative  EKG reviewed by myself and my supervising physician -No evidence of ischemia  MDM: Potassium repleted with 40 mEq orally.  Given patient's recent MI and concerning story, consulted Dr. Einar Gip with cardiology who recommends the patient be admitted for observation.  When discussing the plan of care and recommendations for admission with the patient, he insisted that he needs to leave as he does not have childcare.  He does have a follow-up appointment with Dr. Irish Lack on Monday.  I discussed the risks of leaving [place] and he understands.  I encouraged the patient to return to the ER at any point if his symptoms return.  He voices understanding and is agreeable.  Will send home with potassium supplements.   Discussed the case with Dr. Roslynn Amble who is agreeable to the above plan and disposition.  Final Clinical Impression(s) / ED Diagnoses Final diagnoses:  Chest pain, unspecified type  Hypokalemia    Rx / DC Orders ED Discharge Orders  Ordered    potassium chloride SA (KLOR-CON) 20 MEQ tablet   Once        05/17/20 1503           Lyndel Safe 05/17/20 1504    Lucrezia Starch, MD 05/19/20 [phone]

## 2020-05-18 NOTE — Progress Notes (Deleted)
Cardiology Office Note   Date:  05/18/2020   ID:  Abdulmalik, Darco 10-22-1985, MRN [id number]  PCP:  [name], FNP    No chief complaint on file.  CAD  Wt Readings from Last 3 Encounters:  05/17/20 290 lb (131.5 kg)  02/05/20 (!) 306 lb 6.4 oz (139 kg)  02/01/20 (!) 313 lb 0.9 oz (142 kg)       History of Present Illness: [name] is a 33 y.o. male  with obesity, IDDM, HTN, HLD, family history of CADWhobegan having chest pain 1 day prior to admission. Due to persistent pain, came to the hospital where EKG demonstratedanterior Q waves and marked ST elevation.Cath showed 100% prox LAD (late presenting MI), s/p DES, LVEF 25-35%, mildly elevated LVEDP. 2D echo showed EF 30-35% with anterior/septal akinesis, normal RV function, small pericardial effusion. Also found to have AKI, uncontrolled DM, severe HLD.   He was not taking his BP readings or taking lisinopril regularly prior to the MI.   Cath results showed:  "Prox LAD lesion is 100% stenosed. Late presenting anterior MI, as his symptoms have been going on for about 24 hours.  A drug-eluting stent was successfully placed using a SYNERGY XD [id number], postdilated to greater than 3 mm in diameter.  Post intervention, there is a 0% residual stenosis.  There is moderate to severe left ventricular systolic dysfunction with anterior wall hypokinesis.  LV end diastolic pressure is mildly elevated.  The left ventricular ejection fraction is 25-35% by visual estimate.  There is no aortic valve stenosis.  Watch for 48 hours. Check echocardiogram. May need to consider LifeVest.  Holding lisinopril at this time since his creatinine was elevated in the Cath Lab at the time of procedure.  1 year of dual antiplatelet therapy recommended along with aggressive secondary prevention."  Him to the emergency room on May 17, 2020 due to chest discomfort.  He had a negative work-up including an  unchanged ECG and negative troponin x2.  Past Medical History:  Diagnosis Date  . Coronary artery disease   . Diabetes mellitus without complication ([place])   . Hypercholesteremia   . Hypertension   . Myocardial infarction [facility]) 01/2020    Past Surgical History:  Procedure Laterality Date  . CORONARY STENT INTERVENTION N/A 01/28/2020   Procedure: CORONARY STENT INTERVENTION;  Surgeon: [name], MD;  Location: [place] [facility];  Service: Cardiovascular;  Laterality: N/A;  . CORONARY/GRAFT ACUTE MI REVASCULARIZATION N/A 01/28/2020   Procedure: Coronary/Graft Acute MI Revascularization;  Surgeon: [name], MD;  Location: [place] [facility];  Service: Cardiovascular;  Laterality: N/A;  . LEFT HEART CATH AND CORONARY ANGIOGRAPHY N/A 01/28/2020   Procedure: LEFT HEART CATH AND CORONARY ANGIOGRAPHY;  Surgeon: [name], MD;  Location: [place] [facility];  Service: Cardiovascular;  Laterality: N/A;     Current Outpatient Medications  Medication Sig Dispense Refill  . aspirin 81 MG tablet Take 1 tablet (81 mg total) by mouth daily. 30 tablet 0  . atorvastatin (LIPITOR) 80 MG tablet Take 1 tablet (80 mg total) by mouth daily. 90 tablet 3  . carvedilol (COREG) 3.125 MG tablet Take 1 tablet (3.125 mg total) by mouth 2 (two) times daily with a meal. 120 tablet 3  . clopidogrel (PLAVIX) 75 MG tablet Take 1 tablet (75 mg total) by mouth daily. 90 tablet 3  . LANTUS SOLOSTAR 100 UNIT/ML Solostar Pen Inject 45-50 Units into the skin at  bedtime.     . metFORMIN (GLUCOPHAGE-XR) 500 MG 24 hr tablet Take 500 mg by mouth 2 (two) times daily.    Marland Kitchen NOVOLOG FLEXPEN 100 UNIT/ML FlexPen Inject 5-10 Units into the skin See admin instructions. Inject 5-10 units into the skin three times a day before meals, per sliding scale  0  . potassium chloride SA (KLOR-CON) 20 MEQ tablet Take 1 tablet (20 mEq total) by mouth once for 1 dose. 1 tablet 0   No current facility-administered  medications for this visit.    Allergies:   Patient has no known allergies.    Social History:  The patient  reports that he has never smoked. He has never used smokeless tobacco. He reports that he does not drink alcohol and does not use drugs.   Family History:  The patient's ***family history includes Diabetes in his father.    ROS:  Please see the history of present illness.   Otherwise, review of systems are positive for ***.   All other systems are reviewed and negative.    PHYSICAL EXAM: VS:  There were no vitals taken for this visit. , BMI There is no height or weight on file to calculate BMI. GEN: Well nourished, well developed, in no acute distress  HEENT: normal  Neck: no JVD, carotid bruits, or masses Cardiac: ***RRR; no murmurs, rubs, or gallops,no edema  Respiratory:  clear to auscultation bilaterally, normal work of breathing GI: soft, nontender, nondistended, + BS MS: no deformity or atrophy  Skin: warm and dry, no rash Neuro:  Strength and sensation are intact Psych: euthymic mood, full affect   EKG:   The ekg ordered today demonstrates ***   Recent Labs: 01/31/2020: TSH 1.439 04/01/2020: ALT 27 05/17/2020: BUN 22; Creatinine, Ser 1.46; Hemoglobin 12.5; Platelets 265; Potassium 3.0; Sodium 144   Lipid Panel    Component Value Date/Time   CHOL 131 04/01/2020 0956   TRIG 242 (H) 04/01/2020 0956   HDL 24 (L) 04/01/2020 0956   CHOLHDL 5.5 (H) 04/01/2020 0956   CHOLHDL 6.4 01/29/2020 0731   VLDL 69 (H) 01/29/2020 0731   LDLCALC 67 04/01/2020 0956   LDLDIRECT 118.2 (H) 01/28/2020 1242     Other studies Reviewed: Additional studies/ records that were reviewed today with results demonstrating: ***.   ASSESSMENT AND PLAN:  1.   CAD/old MI:  2.   CRI: Cr. 1.46 in 12/21.  Had ARF with cath.  Resolved.  3.   HTN: low Salt diet.   4.   Hyperlipidemia:  Whole food, plant-based diet.  Continue high intensity statin. 5.  DM: Avoid processed foods.  Increase  fiber content.   Current medicines are reviewed at length with the patient today.  The patient concerns regarding his medicines were addressed.  The following changes have been made:  No change***  Labs/ tests ordered today include: *** No orders of the defined types were placed in this encounter.   Recommend 150 minutes/week of aerobic exercise Low fat, low carb, high fiber diet recommended  Disposition:   FU in ***   Signed, Larae Grooms, MD  05/18/2020 10:20 PM    [facility] [address], [place], [place]  [id number] Phone: [phone]; Fax: [phone]

## 2020-05-19 ENCOUNTER — Ambulatory Visit: Payer: 59 | Admitting: Interventional Cardiology

## 2020-05-19 DIAGNOSIS — N179 Acute kidney failure, unspecified: Secondary | ICD-10-CM

## 2020-05-19 DIAGNOSIS — E782 Mixed hyperlipidemia: Secondary | ICD-10-CM

## 2020-05-19 DIAGNOSIS — I25118 Atherosclerotic heart disease of native coronary artery with other forms of angina pectoris: Secondary | ICD-10-CM

## 2020-05-19 DIAGNOSIS — I10 Essential (primary) hypertension: Secondary | ICD-10-CM

## 2020-05-19 DIAGNOSIS — E1159 Type 2 diabetes mellitus with other circulatory complications: Secondary | ICD-10-CM

## 2020-06-09 ENCOUNTER — Telehealth: Payer: Self-pay

## 2020-06-09 DIAGNOSIS — E785 Hyperlipidemia, unspecified: Secondary | ICD-10-CM

## 2020-06-09 NOTE — Telephone Encounter (Signed)
Called and lmomed the pt for cholesterol labs needed asap. Ordered, just need scheduling

## 2020-06-14 ENCOUNTER — Other Ambulatory Visit: Payer: Self-pay

## 2020-06-14 ENCOUNTER — Encounter (HOSPITAL_COMMUNITY): Payer: Self-pay | Admitting: Emergency Medicine

## 2020-06-14 ENCOUNTER — Emergency Department (HOSPITAL_COMMUNITY)
Admission: EM | Admit: 2020-06-14 | Discharge: 2020-06-14 | Disposition: A | Discharge status: Home / Self Care | Payer: HRSA Program | Attending: Emergency Medicine | Admitting: Emergency Medicine

## 2020-06-14 ENCOUNTER — Emergency Department (HOSPITAL_COMMUNITY): Payer: HRSA Program

## 2020-06-14 DIAGNOSIS — Z794 Long term (current) use of insulin: Secondary | ICD-10-CM | POA: Diagnosis not present

## 2020-06-14 DIAGNOSIS — Z7982 Long term (current) use of aspirin: Secondary | ICD-10-CM | POA: Insufficient documentation

## 2020-06-14 DIAGNOSIS — I2510 Atherosclerotic heart disease of native coronary artery without angina pectoris: Secondary | ICD-10-CM | POA: Insufficient documentation

## 2020-06-14 DIAGNOSIS — U071 COVID-19: Secondary | ICD-10-CM | POA: Diagnosis not present

## 2020-06-14 DIAGNOSIS — I10 Essential (primary) hypertension: Secondary | ICD-10-CM | POA: Diagnosis not present

## 2020-06-14 DIAGNOSIS — R0789 Other chest pain: Secondary | ICD-10-CM

## 2020-06-14 DIAGNOSIS — Z7902 Long term (current) use of antithrombotics/antiplatelets: Secondary | ICD-10-CM | POA: Diagnosis not present

## 2020-06-14 DIAGNOSIS — E119 Type 2 diabetes mellitus without complications: Secondary | ICD-10-CM | POA: Diagnosis not present

## 2020-06-14 DIAGNOSIS — M25512 Pain in left shoulder: Secondary | ICD-10-CM | POA: Diagnosis not present

## 2020-06-14 DIAGNOSIS — Z7984 Long term (current) use of oral hypoglycemic drugs: Secondary | ICD-10-CM | POA: Diagnosis not present

## 2020-06-14 DIAGNOSIS — R079 Chest pain, unspecified: Secondary | ICD-10-CM | POA: Diagnosis present

## 2020-06-14 DIAGNOSIS — Z79899 Other long term (current) drug therapy: Secondary | ICD-10-CM | POA: Diagnosis not present

## 2020-06-14 DIAGNOSIS — M79602 Pain in left arm: Secondary | ICD-10-CM | POA: Insufficient documentation

## 2020-06-14 LAB — CBC
HCT: 37.1 % — ABNORMAL LOW (ref 39.0–52.0)
Hemoglobin: 12.1 g/dL — ABNORMAL LOW (ref 13.0–17.0)
MCH: 29.3 pg (ref 26.0–34.0)
MCHC: 32.6 g/dL (ref 30.0–36.0)
MCV: 89.8 fL (ref 80.0–100.0)
Platelets: 185 10*3/uL (ref 150–400)
RBC: 4.13 MIL/uL — ABNORMAL LOW (ref 4.22–5.81)
RDW: 12.8 % (ref 11.5–15.5)
WBC: 6.6 10*3/uL (ref 4.0–10.5)
nRBC: 0 % (ref 0.0–0.2)

## 2020-06-14 LAB — TROPONIN I (HIGH SENSITIVITY)
Troponin I (High Sensitivity): 10 ng/L (ref <18)
Troponin I (High Sensitivity): 10 ng/L (ref <18)

## 2020-06-14 LAB — BASIC METABOLIC PANEL
Anion gap: 13 (ref 5–15)
BUN: 28 mg/dL — ABNORMAL HIGH (ref 6–20)
CO2: 25 mmol/L (ref 22–32)
Calcium: 9.4 mg/dL (ref 8.9–10.3)
Chloride: 106 mmol/L (ref 98–111)
Creatinine, Ser: 1.75 mg/dL — ABNORMAL HIGH (ref 0.61–1.24)
GFR, Estimated: 51 mL/min — ABNORMAL LOW (ref >60)
Glucose, Bld: 74 mg/dL (ref 70–99)
Potassium: 3.8 mmol/L (ref 3.5–5.1)
Sodium: 144 mmol/L (ref 135–145)

## 2020-06-14 LAB — RESP PANEL BY RT-PCR (FLU A&B, COVID) ARPGX2
Influenza A by PCR: NEGATIVE
Influenza B by PCR: NEGATIVE
SARS Coronavirus 2 by RT PCR: POSITIVE — AB

## 2020-06-14 NOTE — H&P (Incomplete)
History and Physical    [name] QQV:[id number] DOB: 1985/07/23 DOA: 06/14/2020  Referring MD/NP/PA: *** PCP: [name], FNP  Patient coming from: ***  Chief Complaint: ***  I have personally briefly reviewed patient's old medical records in [place]   HPI: [name] is a 35 y.o. male with medical history significant of   ED Course: ***  ROS  Past Medical History:  Diagnosis Date  . Coronary artery disease   . Diabetes mellitus without complication ([place])   . Hypercholesteremia   . Hypertension   . Myocardial infarction [facility]) 01/2020    Past Surgical History:  Procedure Laterality Date  . CORONARY STENT INTERVENTION N/A 01/28/2020   Procedure: CORONARY STENT INTERVENTION;  Surgeon: [name], MD;  Location: [place] [facility];  Service: Cardiovascular;  Laterality: N/A;  . CORONARY/GRAFT ACUTE MI REVASCULARIZATION N/A 01/28/2020   Procedure: Coronary/Graft Acute MI Revascularization;  Surgeon: [name], MD;  Location: [place] [facility];  Service: Cardiovascular;  Laterality: N/A;  . LEFT HEART CATH AND CORONARY ANGIOGRAPHY N/A 01/28/2020   Procedure: LEFT HEART CATH AND CORONARY ANGIOGRAPHY;  Surgeon: [name], MD;  Location: [place] [facility];  Service: Cardiovascular;  Laterality: N/A;     reports that he has never smoked. He has never used smokeless tobacco. He reports that he does not drink alcohol and does not use drugs.  No Known Allergies  Family History  Problem Relation Age of Onset  . Diabetes Father     Prior to Admission medications   Medication Sig Start Date End Date Taking? Authorizing Provider  aspirin 81 MG tablet Take 1 tablet (81 mg total) by mouth daily. 08/25/17   Greg Poet, DO  atorvastatin (LIPITOR) 80 MG tablet Take 1 tablet (80 mg total) by mouth daily. 02/02/20   Greg Raymond, NP  carvedilol (COREG) 3.125 MG tablet Take 1 tablet (3.125 mg total) by mouth 2 (two) times  daily with a meal. 02/01/20   Greg Drown D, NP  clopidogrel (PLAVIX) 75 MG tablet Take 1 tablet (75 mg total) by mouth daily. 02/02/20   Greg Raymond, NP  LANTUS SOLOSTAR 100 UNIT/ML Solostar Pen Inject 45-50 Units into the skin at bedtime.  08/09/14   [provider]  metFORMIN (GLUCOPHAGE-XR) 500 MG 24 hr tablet Take 500 mg by mouth 2 (two) times daily. 02/21/20   [provider]  NOVOLOG FLEXPEN 100 UNIT/ML FlexPen Inject 5-10 Units into the skin See admin instructions. Inject 5-10 units into the skin three times a day before meals, per sliding scale 02/24/18   [provider]  potassium chloride SA (KLOR-CON) 20 MEQ tablet Take 1 tablet (20 mEq total) by mouth once for 1 dose. 05/17/20 05/17/20  Greg Balding, PA-C    Physical Exam:  Constitutional: NAD, calm, comfortable Vitals:   06/14/20 1043 06/14/20 1239 06/14/20 1330  BP: 126/83 97/74 112/77  Pulse: 78 75 71  Resp: 16 18 15   Temp: 98.6 F (37 C)    TempSrc: Oral    SpO2: 100% 100% 98%   Eyes: PERRL, lids and conjunctivae normal ENMT: Mucous membranes are moist. Posterior pharynx clear of any exudate or lesions.Normal dentition.  Neck: normal, supple, no masses, no thyromegaly Respiratory: clear to auscultation bilaterally, no wheezing, no crackles. Normal respiratory effort. No accessory muscle use.  Cardiovascular: Regular rate and rhythm, no murmurs / rubs / gallops. No extremity edema. 2+ pedal pulses. No carotid bruits.  Abdomen: no tenderness, no masses palpated. No hepatosplenomegaly. Bowel sounds positive.  Musculoskeletal: no clubbing / cyanosis. No joint deformity upper and lower extremities. Good ROM, no contractures. Normal muscle tone.  Skin: no rashes, lesions, ulcers. No induration Neurologic: CN 2-12 grossly intact. Sensation intact, DTR normal. Strength 5/5 in all 4.  Psychiatric: Normal judgment and insight. Alert and oriented x 3. Normal mood.     Labs on Admission: I have  personally reviewed following labs and imaging studies  CBC: Recent Labs  Lab 06/14/20 1110  WBC 6.6  HGB 12.1*  HCT 37.1*  MCV 89.8  PLT 956   Basic Metabolic Panel: Recent Labs  Lab 06/14/20 1110  NA 144  K 3.8  CL 106  CO2 25  GLUCOSE 74  BUN 28*  CREATININE 1.75*  CALCIUM 9.4   GFR: CrCl cannot be calculated (Unknown ideal weight.). Liver Function Tests: No results for input(s): AST, ALT, ALKPHOS, BILITOT, PROT, ALBUMIN in the last 168 hours. No results for input(s): LIPASE, AMYLASE in the last 168 hours. No results for input(s): AMMONIA in the last 168 hours. Coagulation Profile: No results for input(s): INR, PROTIME in the last 168 hours. Cardiac Enzymes: No results for input(s): CKTOTAL, CKMB, CKMBINDEX, TROPONINI in the last 168 hours. BNP (last 3 results) No results for input(s): PROBNP in the last 8760 hours. HbA1C: No results for input(s): HGBA1C in the last 72 hours. CBG: No results for input(s): GLUCAP in the last 168 hours. Lipid Profile: No results for input(s): CHOL, HDL, LDLCALC, TRIG, CHOLHDL, LDLDIRECT in the last 72 hours. Thyroid Function Tests: No results for input(s): TSH, T4TOTAL, FREET4, T3FREE, THYROIDAB in the last 72 hours. Anemia Panel: No results for input(s): VITAMINB12, FOLATE, FERRITIN, TIBC, IRON, RETICCTPCT in the last 72 hours. Urine analysis:    Component Value Date/Time   COLORURINE STRAW (A) 07/19/2018 2030   APPEARANCEUR CLEAR 07/19/2018 2030   [place] 1.010 07/19/2018 2030   [place] 5.0 07/19/2018 2030   GLUCOSEU NEGATIVE 07/19/2018 2030   [place] (A) 07/19/2018 2030   [place] NEGATIVE 07/19/2018 2030   [place] 07/19/2018 2030   PROTEINUR NEGATIVE 07/19/2018 2030   NITRITE NEGATIVE 07/19/2018 2030   LEUKOCYTESUR NEGATIVE 07/19/2018 2030   Sepsis Labs: No results found for this or any previous visit (from the past 240 hour(s)).   Radiological Exams on Admission: DG Chest 2 View  Result Date:  06/14/2020 CLINICAL DATA:  Chest pain for 1 day. EXAM: CHEST - 2 VIEW COMPARISON:  May 17, 2020 FINDINGS: The heart size and mediastinal contours are within normal limits. Both lungs are clear. The visualized skeletal structures are unremarkable. IMPRESSION: No active cardiopulmonary disease. Electronically Signed   By: Abelardo Diesel M.Richardson.   On: 06/14/2020 11:32    EKG: Independently reviewed. ***  Assessment/Plan Chest pain   DVT prophylaxis: ***   Code Status: ***  Family Communication: ***  Disposition Plan: ***  Consults called: ***  Admission status: ***  Norval Morton MD [facility]   If 7PM-7AM, please contact night-coverage   06/14/2020, 2:19 PM

## 2020-06-14 NOTE — ED Triage Notes (Signed)
Pt reports pain across chest since last night that radiates to L arm and shoulder blades.  Denies sob, nausea, and vomiting.

## 2020-06-14 NOTE — ED Provider Notes (Signed)
[place] EMERGENCY DEPARTMENT Provider Note   CSN: [id number] Arrival date & time: 06/14/20  1009     History Chief Complaint  Patient presents with  . Chest Pain    [name] is a 35 y.o. male.  The history is provided by the patient and medical records. No language interpreter was used.  Chest Pain    35 year old male with prior history of MI status post DES currently on Plavix, hypertension, hypercholesterolemia, diabetes presents ED for evaluation of chest pain.  Patient report he developed discomfort to his mid chest since yesterday afternoon while sitting and resting.  He described as a sharp shooting sensation radiates towards his left shoulder and his arm.  It has been waxing waning but persistent throughout the night, it has improved and now rates pain as 2 out of 10.  He does not complain of any associated fever or chills no lightheadedness or dizziness no shortness of breath no productive cough no nausea vomiting or diarrhea.  He does endorse some cold symptoms including some congestion sneezing and mild throat irritation for the past 3 days.  He has been fully vaccinated for COVID-19.  He mention having an MI several months prior and this pain felt somewhat similar but not as intense.  He denies tobacco or alcohol use.  Patient states he has been taking his medication as prescribed and have been losing approximately 25 pounds of weight since his last diagnosis.  Past Medical History:  Diagnosis Date  . Coronary artery disease   . Diabetes mellitus without complication ([place])   . Hypercholesteremia   . Hypertension   . Myocardial infarction [facility]) 01/2020    Patient Active Problem List   Diagnosis Date Noted  . Acute anterior wall MI ([place]) 01/28/2020  . Gastroenteritis 07/20/2018  . AKI (acute kidney injury) ([place]) 07/19/2018  . Chest pain 07/19/2018  . Hyperlipidemia 01/14/2015  . Obesity (BMI 30-39.9) 01/14/2015  . Insulin-requiring or  dependent type II diabetes mellitus ([place]) 01/14/2015  . Dyslipidemia 08/31/2014  . HTN (hypertension) 08/31/2014  . Diabetes ([place]) 08/15/2014    Past Surgical History:  Procedure Laterality Date  . CORONARY STENT INTERVENTION N/A 01/28/2020   Procedure: CORONARY STENT INTERVENTION;  Surgeon: [name], MD;  Location: [place] [facility];  Service: Cardiovascular;  Laterality: N/A;  . CORONARY/GRAFT ACUTE MI REVASCULARIZATION N/A 01/28/2020   Procedure: Coronary/Graft Acute MI Revascularization;  Surgeon: [name], MD;  Location: [place] [facility];  Service: Cardiovascular;  Laterality: N/A;  . LEFT HEART CATH AND CORONARY ANGIOGRAPHY N/A 01/28/2020   Procedure: LEFT HEART CATH AND CORONARY ANGIOGRAPHY;  Surgeon: [name], MD;  Location: [place] [facility];  Service: Cardiovascular;  Laterality: N/A;       Family History  Problem Relation Age of Onset  . Diabetes Father     Social History   Tobacco Use  . Smoking status: Never Smoker  . Smokeless tobacco: Never Used  Vaping Use  . Vaping Use: Never used  Substance Use Topics  . Alcohol use: No  . Drug use: No    Home Medications Prior to Admission medications   Medication Sig Start Date End Date Taking? Authorizing Provider  aspirin 81 MG tablet Take 1 tablet (81 mg total) by mouth daily. 08/25/17   Tobie Poet, DO  atorvastatin (LIPITOR) 80 MG tablet Take 1 tablet (80 mg total) by mouth daily. 02/02/20   Kathyrn Drown D, NP  carvedilol (COREG) 3.125 MG  tablet Take 1 tablet (3.125 mg total) by mouth 2 (two) times daily with a meal. 02/01/20   Kathyrn Drown D, NP  clopidogrel (PLAVIX) 75 MG tablet Take 1 tablet (75 mg total) by mouth daily. 02/02/20   Tommie Raymond, NP  LANTUS SOLOSTAR 100 UNIT/ML Solostar Pen Inject 45-50 Units into the skin at bedtime.  08/09/14   [provider]  metFORMIN (GLUCOPHAGE-XR) 500 MG 24 hr tablet Take 500 mg by mouth 2 (two) times daily. 02/21/20    [provider]  NOVOLOG FLEXPEN 100 UNIT/ML FlexPen Inject 5-10 Units into the skin See admin instructions. Inject 5-10 units into the skin three times a day before meals, per sliding scale 02/24/18   [provider]  potassium chloride SA (KLOR-CON) 20 MEQ tablet Take 1 tablet (20 mEq total) by mouth once for 1 dose. 05/17/20 05/17/20  Garald Balding, PA-C    Allergies    Patient has no known allergies.  Review of Systems   Review of Systems  Cardiovascular: Positive for chest pain.  All other systems reviewed and are negative.   Physical Exam Updated Vital Signs BP 97/74   Pulse 75   Temp 98.6 F (37 C) (Oral)   Resp 18   SpO2 100%   Physical Exam Vitals and nursing note reviewed.  Constitutional:      General: He is not in acute distress.    Appearance: He is well-developed and well-nourished. He is obese.  HENT:     Head: Atraumatic.  Eyes:     Conjunctiva/sclera: Conjunctivae normal.  Cardiovascular:     Rate and Rhythm: Normal rate and regular rhythm.     Heart sounds: Normal heart sounds.  Pulmonary:     Effort: Pulmonary effort is normal.     Breath sounds: Normal breath sounds. No decreased breath sounds, wheezing, rhonchi or rales.  Musculoskeletal:     Cervical back: Neck supple.     Right lower leg: No edema.     Left lower leg: No edema.  Skin:    Findings: No rash.  Neurological:     Mental Status: He is alert and oriented to person, place, and time.  Psychiatric:        Mood and Affect: Mood and affect and mood normal.     ED Results / Procedures / Treatments   Labs (all labs ordered are listed, but only abnormal results are displayed) Labs Reviewed  RESP PANEL BY RT-PCR (FLU A&B, COVID) ARPGX2 - Abnormal; Notable for the following components:      Result Value   SARS Coronavirus 2 by RT PCR POSITIVE (*)    All other components within normal limits  BASIC METABOLIC PANEL - Abnormal; Notable for the following components:   BUN  28 (*)    Creatinine, Ser 1.75 (*)    GFR, Estimated 51 (*)    All other components within normal limits  CBC - Abnormal; Notable for the following components:   RBC 4.13 (*)    Hemoglobin 12.1 (*)    HCT 37.1 (*)    All other components within normal limits  RAPID URINE DRUG SCREEN, HOSP PERFORMED  TROPONIN I (HIGH SENSITIVITY)  TROPONIN I (HIGH SENSITIVITY)    EKG None  ED ECG REPORT   Date: 06/14/2020  Rate: 81  Rhythm: normal sinus rhythm  QRS Axis: right  Intervals: normal  ST/T Wave abnormalities: nonspecific T wave changes  Conduction Disutrbances:none  Narrative Interpretation:   Old EKG Reviewed:  unchanged  I have personally reviewed the EKG tracing and agree with the computerized printout as noted.   Radiology DG Chest 2 View  Result Date: 06/14/2020 CLINICAL DATA:  Chest pain for 1 day. EXAM: CHEST - 2 VIEW COMPARISON:  May 17, 2020 FINDINGS: The heart size and mediastinal contours are within normal limits. Both lungs are clear. The visualized skeletal structures are unremarkable. IMPRESSION: No active cardiopulmonary disease. Electronically Signed   By: Abelardo Diesel M.D.   On: 06/14/2020 11:32    Procedures Procedures (including critical care time)  Medications Ordered in ED Medications - No data to display  ED Course  I have reviewed the triage vital signs and the nursing notes.  Pertinent labs & imaging results that were available during my care of the patient were reviewed by me and considered in my medical decision making (see chart for details).    MDM Rules/Calculators/A&P                          BP 97/74   Pulse 75   Temp 98.6 F (37 C) (Oral)   Resp 18   SpO2 100%   Final Clinical Impression(s) / ED Diagnoses Final diagnoses:  COVID-19 virus infection  Atypical chest pain    Rx / DC Orders ED Discharge Orders    None     1:08 PM Patient is a 35 year old male significant family history of CAD who previously had an MI in  August of last year with a heart cath showing 100% occlusions of the proximal LAD status post drug-eluting stents.  His cardiologist is Dr. Irish Lack.  He reports pain today feels somewhat similar to prior but less intense.  Pain is currently 2 out of 10 without any specific treatment.  EKG without concerning finding.  Work-up initiated.  2:25 PM Patient has negative delta troponin.  EKG without concerning changes.  His pain is minimal at this time.  Covid test is currently pending.  Will consult cardiology for recommendation.  3:15 PM Patient did report having some sinus congestion and cold symptoms for the past few days.  He has been fully vaccinated for COVID-19.  Covid test came back showing positive Covid infection thus likely explain his discomfort.  He reported most of his discomfort now is to the back of his neck and back which is likely myalgia.  Since patient does not have any chest pain and he does have a source to explain his aches and pain at this time I have very low suspicion for cardiac cause contributing to his symptoms.  He is not hypoxic will give outpatient follow-up and will also provide return precaution.  Finnis Bj Morlock was evaluated in Emergency Department on 06/14/2020 for the symptoms described in the history of present illness. He was evaluated in the context of the global COVID-19 pandemic, which necessitated consideration that the patient might be at risk for infection with the SARS-CoV-2 virus that causes COVID-19. Institutional protocols and algorithms that pertain to the evaluation of patients at risk for COVID-19 are in a state of rapid change based on information released by regulatory bodies including the CDC and federal and state organizations. These policies and algorithms were followed during the patient's care in the ED.    Domenic Moras, PA-C 06/14/20 1522    Lajean Saver, MD 06/15/20 2043

## 2020-06-14 NOTE — Discharge Instructions (Signed)
You have been tested positive for Covid infection.  Please follow instruction below

## 2020-06-30 ENCOUNTER — Telehealth: Payer: Self-pay

## 2020-06-30 NOTE — Telephone Encounter (Signed)
-----   Message from Ramond Dial, [place] sent at 06/27/2020 10:15 AM EST ----- Try calling again please ----- Message ----- From: Allean Found, CMA Sent: 06/09/2020   9:19 AM EST To: Ramond Dial, RPH-CPP  lmom ----- Message ----- From: Ramond Dial, RPH-CPP Sent: 06/09/2020   8:26 AM EST To: Allean Found, CMA   ----- Message ----- From: Ramond Dial, RPH-CPP Sent: 06/05/2020  12:00 AM EST To: Ramond Dial, RPH-CPP  Set up lipid labs

## 2020-06-30 NOTE — Telephone Encounter (Signed)
Unable to lmom voicemail full

## 2020-09-30 ENCOUNTER — Other Ambulatory Visit: Payer: Self-pay | Admitting: Cardiology

## 2020-10-30 ENCOUNTER — Emergency Department (HOSPITAL_COMMUNITY): Payer: 59

## 2020-10-30 ENCOUNTER — Other Ambulatory Visit: Payer: Self-pay

## 2020-10-30 ENCOUNTER — Emergency Department (HOSPITAL_COMMUNITY)
Admission: EM | Admit: 2020-10-30 | Discharge: 2020-10-30 | Disposition: A | Discharge status: Home / Self Care | Payer: 59 | Attending: Emergency Medicine | Admitting: Emergency Medicine

## 2020-10-30 ENCOUNTER — Encounter (HOSPITAL_COMMUNITY): Payer: Self-pay

## 2020-10-30 DIAGNOSIS — I2510 Atherosclerotic heart disease of native coronary artery without angina pectoris: Secondary | ICD-10-CM | POA: Diagnosis not present

## 2020-10-30 DIAGNOSIS — R072 Precordial pain: Secondary | ICD-10-CM | POA: Insufficient documentation

## 2020-10-30 DIAGNOSIS — I10 Essential (primary) hypertension: Secondary | ICD-10-CM | POA: Diagnosis not present

## 2020-10-30 DIAGNOSIS — Z955 Presence of coronary angioplasty implant and graft: Secondary | ICD-10-CM | POA: Insufficient documentation

## 2020-10-30 DIAGNOSIS — Z794 Long term (current) use of insulin: Secondary | ICD-10-CM | POA: Diagnosis not present

## 2020-10-30 DIAGNOSIS — Z79899 Other long term (current) drug therapy: Secondary | ICD-10-CM | POA: Insufficient documentation

## 2020-10-30 DIAGNOSIS — E119 Type 2 diabetes mellitus without complications: Secondary | ICD-10-CM | POA: Insufficient documentation

## 2020-10-30 DIAGNOSIS — R0602 Shortness of breath: Secondary | ICD-10-CM | POA: Insufficient documentation

## 2020-10-30 DIAGNOSIS — Z7982 Long term (current) use of aspirin: Secondary | ICD-10-CM | POA: Diagnosis not present

## 2020-10-30 DIAGNOSIS — R519 Headache, unspecified: Secondary | ICD-10-CM | POA: Insufficient documentation

## 2020-10-30 DIAGNOSIS — Z7984 Long term (current) use of oral hypoglycemic drugs: Secondary | ICD-10-CM | POA: Diagnosis not present

## 2020-10-30 LAB — BASIC METABOLIC PANEL
Anion gap: 6 (ref 5–15)
BUN: 24 mg/dL — ABNORMAL HIGH (ref 6–20)
CO2: 25 mmol/L (ref 22–32)
Calcium: 9 mg/dL (ref 8.9–10.3)
Chloride: 107 mmol/L (ref 98–111)
Creatinine, Ser: 1.7 mg/dL — ABNORMAL HIGH (ref 0.61–1.24)
GFR, Estimated: 53 mL/min — ABNORMAL LOW (ref >60)
Glucose, Bld: 114 mg/dL — ABNORMAL HIGH (ref 70–99)
Potassium: 4 mmol/L (ref 3.5–5.1)
Sodium: 138 mmol/L (ref 135–145)

## 2020-10-30 LAB — CBC
HCT: 37 % — ABNORMAL LOW (ref 39.0–52.0)
Hemoglobin: 12.2 g/dL — ABNORMAL LOW (ref 13.0–17.0)
MCH: 28.9 pg (ref 26.0–34.0)
MCHC: 33 g/dL (ref 30.0–36.0)
MCV: 87.7 fL (ref 80.0–100.0)
Platelets: 217 10*3/uL (ref 150–400)
RBC: 4.22 MIL/uL (ref 4.22–5.81)
RDW: 13.1 % (ref 11.5–15.5)
WBC: 9.4 10*3/uL (ref 4.0–10.5)
nRBC: 0 % (ref 0.0–0.2)

## 2020-10-30 LAB — TROPONIN I (HIGH SENSITIVITY)
Troponin I (High Sensitivity): 13 ng/L (ref <18)
Troponin I (High Sensitivity): 13 ng/L (ref <18)

## 2020-10-30 MED ORDER — SUCRALFATE 1 GM/10ML PO SUSP: 1.0000 g | Freq: Three times a day (TID) | ORAL | 0 refills | Status: DC

## 2020-10-30 MED ORDER — ACETAMINOPHEN 500 MG PO TABS
1000.0000 mg | ORAL_TABLET | Freq: Once | ORAL | Status: AC
  Administered 2020-10-30: 1000 mg via ORAL
  Filled 2020-10-30: qty 2

## 2020-10-30 MED ORDER — ALUM & MAG HYDROXIDE-SIMETH 200-200-20 MG/5ML PO SUSP
30.0000 mL | Freq: Once | ORAL | Status: AC
  Administered 2020-10-30: 30 mL via ORAL
  Filled 2020-10-30: qty 30

## 2020-10-30 MED ORDER — PANTOPRAZOLE SODIUM 20 MG PO TBEC: 20.0000 mg | DELAYED_RELEASE_TABLET | Freq: Two times a day (BID) | ORAL | 0 refills | Status: DC

## 2020-10-30 NOTE — ED Provider Notes (Signed)
Patient was initially seen by Dr. Nicholes Stairs.  Please see her note.  Plan was to follow-up on the second troponin and head CT.  The troponin is unchanged.  Head CT does not show any acute findings.  Will dc home with antacids.  Follow up with cardiologist.  REturn precautions discussed   Dorie Rank, MD 10/30/20 [phone]

## 2020-10-30 NOTE — Discharge Instructions (Signed)
Take the medications as prescribed.  Take tylenol for headache.  Follow up with your cardiologist.  Return to the ED for recurrent symptoms.

## 2020-10-30 NOTE — ED Provider Notes (Signed)
[facility] EMERGENCY DEPARTMENT Provider Note   CSN: [id number] Arrival date & time: 10/30/20  0305     History Chief Complaint  Patient presents with  . Chest Pain    [name] is a 35 y.o. male.  The history is provided by the patient.  Chest Pain Pain location:  Substernal area Pain quality: dull   Radiates to: base of the neck. Pain severity:  Moderate Onset quality:  Sudden Timing:  Constant Progression:  Improving Chronicity:  Recurrent Context: at rest   Relieved by:  Nothing Worsened by:  Nothing Ineffective treatments:  None tried Associated symptoms: headache and shortness of breath   Associated symptoms: no abdominal pain, no altered mental status, no anorexia, no anxiety, no back pain, no claudication, no cough, no diaphoresis, no fever, no heartburn, no lower extremity edema, no nausea, no near-syncope, no palpitations, no PND, no syncope and no vomiting   Patient with CAD presents with CP this evening at rest at approximately 9 pm.  He was post prandial at this time and was able to belch but this did not alleviate the pain.  He had associated SOB, but no n/v/d.  No travel.  No leg swelling.  No exertional complaints.         Past Medical History:  Diagnosis Date  . Coronary artery disease   . Diabetes mellitus without complication ([place])   . Hypercholesteremia   . Hypertension   . Myocardial infarction [facility]) 01/2020    Patient Active Problem List   Diagnosis Date Noted  . Acute anterior wall MI ([place]) 01/28/2020  . Gastroenteritis 07/20/2018  . AKI (acute kidney injury) ([place]) 07/19/2018  . Chest pain 07/19/2018  . Hyperlipidemia 01/14/2015  . Obesity (BMI 30-39.9) 01/14/2015  . Insulin-requiring or dependent type II diabetes mellitus ([place]) 01/14/2015  . Dyslipidemia 08/31/2014  . HTN (hypertension) 08/31/2014  . Diabetes ([place]) 08/15/2014    Past Surgical History:  Procedure Laterality Date  . CORONARY STENT  INTERVENTION N/A 01/28/2020   Procedure: CORONARY STENT INTERVENTION;  Surgeon: [name], MD;  Location: [facility];  Service: Cardiovascular;  Laterality: N/A;  . CORONARY/GRAFT ACUTE MI REVASCULARIZATION N/A 01/28/2020   Procedure: Coronary/Graft Acute MI Revascularization;  Surgeon: [name], MD;  Location: [facility];  Service: Cardiovascular;  Laterality: N/A;  . LEFT HEART CATH AND CORONARY ANGIOGRAPHY N/A 01/28/2020   Procedure: LEFT HEART CATH AND CORONARY ANGIOGRAPHY;  Surgeon: [name], MD;  Location: [facility];  Service: Cardiovascular;  Laterality: N/A;       Family History  Problem Relation Age of Onset  . Diabetes Father     Social History   Tobacco Use  . Smoking status: Never Smoker  . Smokeless tobacco: Never Used  Vaping Use  . Vaping Use: Never used  Substance Use Topics  . Alcohol use: No  . Drug use: No    Home Medications Prior to Admission medications   Medication Sig Start Date End Date Taking? Authorizing Provider  aspirin 81 MG tablet Take 1 tablet (81 mg total) by mouth daily. 08/25/17   Tobie Poet, DO  atorvastatin (LIPITOR) 80 MG tablet Take 1 tablet (80 mg total) by mouth daily. 02/02/20   Kathyrn Drown D, NP  carvedilol (COREG) 3.125 MG tablet TAKE 1 TABLET(3.125 MG) BY MOUTH TWICE DAILY WITH A MEAL 10/01/20   [name], MD  clopidogrel (PLAVIX) 75 MG tablet Take 1 tablet (75 mg  total) by mouth daily. 02/02/20   Tommie Raymond, NP  LANTUS SOLOSTAR 100 UNIT/ML Solostar Pen Inject 45-50 Units into the skin at bedtime.  08/09/14   [provider]  metFORMIN (GLUCOPHAGE-XR) 500 MG 24 hr tablet Take 500 mg by mouth 2 (two) times daily. 02/21/20   [provider]  NOVOLOG FLEXPEN 100 UNIT/ML FlexPen Inject 5-10 Units into the skin See admin instructions. Inject 5-10 units into the skin three times a day before meals, per sliding scale 02/24/18   [provider]   potassium chloride SA (KLOR-CON) 20 MEQ tablet Take 1 tablet (20 mEq total) by mouth once for 1 dose. 05/17/20 05/17/20  Garald Balding, PA-C    Allergies    Patient has no known allergies.  Review of Systems   Review of Systems  Constitutional: Negative for diaphoresis and fever.  HENT: Negative for congestion.   Eyes: Negative for visual disturbance.  Respiratory: Positive for shortness of breath. Negative for cough.   Cardiovascular: Positive for chest pain. Negative for palpitations, claudication, syncope, PND and near-syncope.  Gastrointestinal: Negative for abdominal pain, anorexia, heartburn, nausea and vomiting.  Genitourinary: Negative for difficulty urinating.  Musculoskeletal: Negative for back pain.  Neurological: Positive for headaches.  Psychiatric/Behavioral: Negative for agitation.  All other systems reviewed and are negative.   Physical Exam Updated Vital Signs BP (!) 169/96   Pulse 73   Temp 98.1 F (36.7 C) (Oral)   Resp 13   Ht '6\' 4"'$  (1.93 m)   Wt 131.5 kg   SpO2 100%   BMI 35.29 kg/m   Physical Exam Vitals and nursing note reviewed.  Constitutional:      General: He is not in acute distress.    Appearance: Normal appearance.  HENT:     Head: Normocephalic and atraumatic.     Nose: Nose normal.  Eyes:     Extraocular Movements: Extraocular movements intact.     Conjunctiva/sclera: Conjunctivae normal.     Pupils: Pupils are equal, round, and reactive to light.  Cardiovascular:     Rate and Rhythm: Normal rate and regular rhythm.     Pulses: Normal pulses.     Heart sounds: Normal heart sounds.  Pulmonary:     Effort: Pulmonary effort is normal.     Breath sounds: Normal breath sounds.  Abdominal:     General: Abdomen is flat. Bowel sounds are normal.     Palpations: Abdomen is soft.     Tenderness: There is no abdominal tenderness. There is no guarding.  Musculoskeletal:        General: Normal range of motion.     Cervical back: Normal  range of motion and neck supple.  Skin:    General: Skin is warm and dry.     Capillary Refill: Capillary refill takes less than 2 seconds.  Neurological:     General: No focal deficit present.     Mental Status: He is alert and oriented to person, place, and time.     Deep Tendon Reflexes: Reflexes normal.  Psychiatric:        Mood and Affect: Mood normal.        Behavior: Behavior normal.     ED Results / Procedures / Treatments   Labs (all labs ordered are listed, but only abnormal results are displayed) Results for orders placed or performed during the hospital encounter of [id number]  Basic metabolic panel  Result Value Ref Range   Sodium 138 135 - 145  mmol/L   Potassium 4.0 3.5 - 5.1 mmol/L   Chloride 107 98 - 111 mmol/L   CO2 25 22 - 32 mmol/L   Glucose, Bld 114 (H) 70 - 99 mg/dL   BUN 24 (H) 6 - 20 mg/dL   Creatinine, Ser 1.70 (H) 0.61 - 1.24 mg/dL   Calcium 9.0 8.9 - 10.3 mg/dL   GFR, Estimated 53 (L) >60 mL/min   Anion gap 6 5 - 15  CBC  Result Value Ref Range   WBC 9.4 4.0 - 10.5 K/uL   RBC 4.22 4.22 - 5.81 MIL/uL   Hemoglobin 12.2 (L) 13.0 - 17.0 g/dL   HCT 37.0 (L) 39.0 - 52.0 %   MCV 87.7 80.0 - 100.0 fL   MCH 28.9 26.0 - 34.0 pg   MCHC 33.0 30.0 - 36.0 g/dL   RDW 13.1 11.5 - 15.5 %   Platelets 217 150 - 400 K/uL   nRBC 0.0 0.0 - 0.2 %  Troponin I (High Sensitivity)  Result Value Ref Range   Troponin I (High Sensitivity) 13 <18 ng/L   DG Chest 2 View  Result Date: 10/30/2020 CLINICAL DATA:  Chest pain EXAM: CHEST - 2 VIEW COMPARISON:  June 14, 2020 FINDINGS: The heart size and mediastinal contours are within normal limits. No focal consolidation. No pleural effusion. No pneumothorax. The visualized skeletal structures are unremarkable. IMPRESSION: No active cardiopulmonary disease. Electronically Signed   By: Dahlia Bailiff MD   On: 10/30/2020 03:51    EKG EKG Interpretation  Date/Time:  Thursday Oct 30 2020 04:26:37 EDT Ventricular Rate:  77 PR  Interval:  165 QRS Duration: 106 QT Interval:  395 QTC Calculation: 447 R Axis:   181 Text Interpretation: Sinus rhythm Probable left atrial enlargement Anterior infarct, old Confirmed by Randal Buba, Ahmaad Neidhardt ([id number]) on 10/30/2020 6:32:24 AM   Radiology DG Chest 2 View  Result Date: 10/30/2020 CLINICAL DATA:  Chest pain EXAM: CHEST - 2 VIEW COMPARISON:  June 14, 2020 FINDINGS: The heart size and mediastinal contours are within normal limits. No focal consolidation. No pleural effusion. No pneumothorax. The visualized skeletal structures are unremarkable. IMPRESSION: No active cardiopulmonary disease. Electronically Signed   By: Dahlia Bailiff MD   On: 10/30/2020 03:51    Procedures Procedures   Medications Ordered in ED Medications  alum & mag hydroxide-simeth (MAALOX/MYLANTA) 200-200-20 MG/5ML suspension 30 mL (30 mLs Oral Given 10/30/20 0625)  acetaminophen (TYLENOL) tablet 1,000 mg (1,000 mg Oral Given 10/30/20 [id number])    ED Course  I have reviewed the triage vital signs and the nursing notes.  Pertinent labs & imaging results that were available during my care of the patient were reviewed by me and considered in my medical decision making (see chart for details).    Developed a gradual onset headache during the visit and requested tylenol.  Plan for CT of the head to exclude intracranial bleed.   I suspect this is GERD based on the symptoms.  Patient will require a second troponin and head CT to clear .    [name] was evaluated in Emergency Department on 10/30/2020 for the symptoms described in the history of present illness. He was evaluated in the context of the global COVID-19 pandemic, which necessitated consideration that the patient might be at risk for infection with the SARS-CoV-2 virus that causes COVID-19. Institutional protocols and algorithms that pertain to the evaluation of patients at risk for COVID-19 are in a state of rapid change based on information released  by regulatory bodies including the CDC and federal and state organizations. These policies and algorithms were followed during the patient's care in the ED.  Final Clinical Impression(s) / ED Diagnoses Signed out to Dr. Tomi Bamberger pending head and troponin   Torre Schaumburg, MD 10/30/20 [id number]

## 2020-10-30 NOTE — ED Notes (Signed)
Patient transported to CT 

## 2020-10-30 NOTE — ED Triage Notes (Signed)
midsternal chest pain radiating to the back a couple of hours ago, with shortness of breath.   Denies any nausea, dizziness and cough. Hx of HTN and cardiac stent. [id number]

## 2020-12-22 ENCOUNTER — Other Ambulatory Visit: Payer: Self-pay

## 2020-12-22 ENCOUNTER — Emergency Department (HOSPITAL_COMMUNITY)
Admission: EM | Admit: 2020-12-22 | Discharge: 2020-12-22 | Disposition: A | Discharge status: Home / Self Care | Payer: 59 | Attending: Emergency Medicine | Admitting: Emergency Medicine

## 2020-12-22 ENCOUNTER — Emergency Department (HOSPITAL_COMMUNITY): Payer: 59

## 2020-12-22 DIAGNOSIS — I2510 Atherosclerotic heart disease of native coronary artery without angina pectoris: Secondary | ICD-10-CM | POA: Diagnosis not present

## 2020-12-22 DIAGNOSIS — Z7902 Long term (current) use of antithrombotics/antiplatelets: Secondary | ICD-10-CM | POA: Insufficient documentation

## 2020-12-22 DIAGNOSIS — Z7984 Long term (current) use of oral hypoglycemic drugs: Secondary | ICD-10-CM | POA: Diagnosis not present

## 2020-12-22 DIAGNOSIS — R110 Nausea: Secondary | ICD-10-CM | POA: Insufficient documentation

## 2020-12-22 DIAGNOSIS — Z794 Long term (current) use of insulin: Secondary | ICD-10-CM | POA: Insufficient documentation

## 2020-12-22 DIAGNOSIS — Z7982 Long term (current) use of aspirin: Secondary | ICD-10-CM | POA: Diagnosis not present

## 2020-12-22 DIAGNOSIS — R61 Generalized hyperhidrosis: Secondary | ICD-10-CM | POA: Diagnosis not present

## 2020-12-22 DIAGNOSIS — R0789 Other chest pain: Secondary | ICD-10-CM | POA: Diagnosis present

## 2020-12-22 DIAGNOSIS — Z79899 Other long term (current) drug therapy: Secondary | ICD-10-CM | POA: Diagnosis not present

## 2020-12-22 DIAGNOSIS — I10 Essential (primary) hypertension: Secondary | ICD-10-CM | POA: Insufficient documentation

## 2020-12-22 DIAGNOSIS — R079 Chest pain, unspecified: Secondary | ICD-10-CM

## 2020-12-22 DIAGNOSIS — E119 Type 2 diabetes mellitus without complications: Secondary | ICD-10-CM | POA: Diagnosis not present

## 2020-12-22 DIAGNOSIS — R0602 Shortness of breath: Secondary | ICD-10-CM | POA: Insufficient documentation

## 2020-12-22 LAB — CBC
HCT: 38.3 % — ABNORMAL LOW (ref 39.0–52.0)
Hemoglobin: 12.8 g/dL — ABNORMAL LOW (ref 13.0–17.0)
MCH: 28.6 pg (ref 26.0–34.0)
MCHC: 33.4 g/dL (ref 30.0–36.0)
MCV: 85.7 fL (ref 80.0–100.0)
Platelets: 206 10*3/uL (ref 150–400)
RBC: 4.47 MIL/uL (ref 4.22–5.81)
RDW: 12.6 % (ref 11.5–15.5)
WBC: 7.9 10*3/uL (ref 4.0–10.5)
nRBC: 0 % (ref 0.0–0.2)

## 2020-12-22 LAB — TROPONIN I (HIGH SENSITIVITY)
Troponin I (High Sensitivity): 11 ng/L (ref <18)
Troponin I (High Sensitivity): 12 ng/L (ref <18)

## 2020-12-22 LAB — BASIC METABOLIC PANEL
Anion gap: 6 (ref 5–15)
BUN: 23 mg/dL — ABNORMAL HIGH (ref 6–20)
CO2: 31 mmol/L (ref 22–32)
Calcium: 8.9 mg/dL (ref 8.9–10.3)
Chloride: 101 mmol/L (ref 98–111)
Creatinine, Ser: 1.91 mg/dL — ABNORMAL HIGH (ref 0.61–1.24)
GFR, Estimated: 46 mL/min — ABNORMAL LOW (ref >60)
Glucose, Bld: 157 mg/dL — ABNORMAL HIGH (ref 70–99)
Potassium: 3.5 mmol/L (ref 3.5–5.1)
Sodium: 138 mmol/L (ref 135–145)

## 2020-12-22 MED ORDER — PANTOPRAZOLE SODIUM 40 MG IV SOLR
40.0000 mg | Freq: Once | INTRAVENOUS | Status: AC
  Administered 2020-12-22: 40 mg via INTRAVENOUS
  Filled 2020-12-22: qty 40

## 2020-12-22 MED ORDER — LIDOCAINE VISCOUS HCL 2 % MT SOLN
15.0000 mL | Freq: Once | OROMUCOSAL | Status: AC
  Administered 2020-12-22: 15 mL via ORAL
  Filled 2020-12-22: qty 15

## 2020-12-22 MED ORDER — ASPIRIN 81 MG PO CHEW
CHEWABLE_TABLET | ORAL | Status: AC
  Filled 2020-12-22: qty 4

## 2020-12-22 MED ORDER — PANTOPRAZOLE SODIUM 20 MG PO TBEC: 20.0000 mg | DELAYED_RELEASE_TABLET | Freq: Two times a day (BID) | ORAL | 0 refills | Status: DC

## 2020-12-22 MED ORDER — ALUM & MAG HYDROXIDE-SIMETH 200-200-20 MG/5ML PO SUSP
30.0000 mL | Freq: Once | ORAL | Status: AC
  Administered 2020-12-22: 30 mL via ORAL
  Filled 2020-12-22: qty 30

## 2020-12-22 MED ORDER — ASPIRIN 81 MG PO CHEW
324.0000 mg | CHEWABLE_TABLET | Freq: Once | ORAL | Status: AC
  Administered 2020-12-22: 324 mg via ORAL
  Filled 2020-12-22: qty 4

## 2020-12-22 NOTE — ED Triage Notes (Signed)
Pt c/o acute onset of centralized chest pain this morning while at rest. Pain radiates down left arm and back, along with SOB, nausea, and headache. Pt states stent placed August 2021. This pain is similar to the pain before. Take plavix.

## 2020-12-22 NOTE — Discharge Instructions (Addendum)
You were seen in the emergency department today for chest pain. Your work-up in the emergency department has been overall reassuring. Your labs have been fairly normal and or similar to previous blood work you have had done. Your EKG and the enzyme we use to check your heart did not show an acute heart attack at this time. Your chest x-ray was normal.   Please begin taking Protonix again twice daily as needed, you can use Tums or Maalox as needed for breakthrough symptoms.  We would like you to follow up closely with your primary care provider and cardiologist  within 1 week. Return to the ER immediately should you experience any new or worsening symptoms including but not limited to return of pain, worsened pain, vomiting, shortness of breath, dizziness, lightheadedness, passing out, or any other concerns that you may have.

## 2020-12-22 NOTE — ED Provider Notes (Signed)
[facility] EMERGENCY DEPARTMENT Provider Note   CSN: [id number] Arrival date & time: 12/22/20  [id number]     History Chief Complaint  Patient presents with   Chest Pain    [name] is a 35 y.o. male.  [name] is a 35 y.o. male with hx of HTN, DM, HLD, CAD s/p PCI, and obesity, who presents for evaluation of chest pain. Symptoms started at midnight last night. Reports central chest pain that radiates to the left shoulder. Pain described as a pressure. Nothing makes it better or worse. Pain has been constant and not improving. No meds prior to arrival.  Pain feels similar to when he got his previous stent.  He had a similar episode of pain a few months ago and was treated with PPI, which he had been taking regularly until a week ago.  No associated abdominal pain.  Does report that he had some nausea and diaphoresis when pain began as well as shortness of breath which is now improved.  No lower extremity swelling.  Pain is not pleuritic or exertional.  Followed by Dr. Scarlette Calico with cardiology.  Reports he has been compliant with his medications.  No smoking or drug use.  The history is provided by the patient.      Past Medical History:  Diagnosis Date   Coronary artery disease    Diabetes mellitus without complication ([place])    Hypercholesteremia    Hypertension    Myocardial infarction ([place]) 01/2020    Patient Active Problem List   Diagnosis Date Noted   Acute anterior wall MI ([place]) 01/28/2020   Gastroenteritis 07/20/2018   AKI (acute kidney injury) ([place]) 07/19/2018   Chest pain 07/19/2018   Hyperlipidemia 01/14/2015   Obesity (BMI 30-39.9) 01/14/2015   Insulin-requiring or dependent type II diabetes mellitus ([place]) 01/14/2015   Dyslipidemia 08/31/2014   HTN (hypertension) 08/31/2014   Diabetes ([place]) 08/15/2014    Past Surgical History:  Procedure Laterality Date   CORONARY STENT INTERVENTION N/A 01/28/2020   Procedure: CORONARY STENT  INTERVENTION;  Surgeon: [name], MD;  Location: [facility];  Service: Cardiovascular;  Laterality: N/A;   CORONARY/GRAFT ACUTE MI REVASCULARIZATION N/A 01/28/2020   Procedure: Coronary/Graft Acute MI Revascularization;  Surgeon: [name], MD;  Location: [facility];  Service: Cardiovascular;  Laterality: N/A;   LEFT HEART CATH AND CORONARY ANGIOGRAPHY N/A 01/28/2020   Procedure: LEFT HEART CATH AND CORONARY ANGIOGRAPHY;  Surgeon: [name], MD;  Location: [facility];  Service: Cardiovascular;  Laterality: N/A;       Family History  Problem Relation Age of Onset   Diabetes Father     Social History   Tobacco Use   Smoking status: Never   Smokeless tobacco: Never  Vaping Use   Vaping Use: Never used  Substance Use Topics   Alcohol use: No   Drug use: No    Home Medications Prior to Admission medications   Medication Sig Start Date End Date Taking? Authorizing Provider  aspirin 81 MG tablet Take 1 tablet (81 mg total) by mouth daily. 08/25/17  Yes Dong, Robin, DO  atorvastatin (LIPITOR) 80 MG tablet Take 1 tablet (80 mg total) by mouth daily. 02/02/20  Yes Kathyrn Drown D, NP  carvedilol (COREG) 3.125 MG tablet TAKE 1 TABLET(3.125 MG) BY MOUTH TWICE DAILY WITH A MEAL Patient taking differently: Take 3.125 mg by mouth 2 (two) times daily with a meal. 10/01/20  Yes  [name], MD  clopidogrel (PLAVIX) 75 MG tablet Take 1 tablet (75 mg total) by mouth daily. 02/02/20  Yes Kathyrn Drown D, NP  LANTUS SOLOSTAR 100 UNIT/ML Solostar Pen Inject 50 Units into the skin at bedtime. 08/09/14  Yes [provider]  metFORMIN (GLUCOPHAGE-XR) 500 MG 24 hr tablet Take 500 mg by mouth 2 (two) times daily. 02/21/20  Yes [provider]  NOVOLOG FLEXPEN 100 UNIT/ML FlexPen Inject 5-10 Units into the skin See admin instructions. Inject 5-10 units into the skin three times a day before meals, per sliding scale 02/24/18  Yes  [provider]  pantoprazole (PROTONIX) 20 MG tablet Take 1 tablet (20 mg total) by mouth 2 (two) times daily. 12/22/20   Jacqlyn Larsen, PA-C  potassium chloride SA (KLOR-CON) 20 MEQ tablet Take 1 tablet (20 mEq total) by mouth once for 1 dose. Patient not taking: Reported on 12/22/2020 05/17/20 05/17/20  Garald Balding, PA-C  sucralfate (CARAFATE) 1 GM/10ML suspension Take 10 mLs (1 g total) by mouth 4 (four) times daily -  with meals and at bedtime. Patient not taking: No sig reported 10/30/20   Palumbo, April, MD    Allergies    Patient has no known allergies.  Review of Systems   Review of Systems  Constitutional:  Positive for diaphoresis. Negative for chills and fever.  HENT: Negative.    Respiratory:  Positive for shortness of breath. Negative for cough.   Cardiovascular:  Positive for chest pain. Negative for palpitations and leg swelling.  Gastrointestinal:  Positive for nausea. Negative for abdominal pain and vomiting.  Musculoskeletal:  Negative for arthralgias and myalgias.  Skin:  Negative for color change and rash.  Neurological:  Negative for dizziness, syncope, weakness, light-headedness and numbness.  All other systems reviewed and are negative.  Physical Exam Updated Vital Signs BP (!) 158/110   Pulse 72   Temp 98.4 F (36.9 C) (Oral)   Resp 14   Ht '6\' 4"'$  (1.93 m)   Wt 136.1 kg   SpO2 98%   BMI 36.52 kg/m   Physical Exam Vitals and nursing note reviewed.  Constitutional:      General: He is not in acute distress.    Appearance: Normal appearance. He is well-developed. He is obese. He is not diaphoretic.  HENT:     Head: Normocephalic and atraumatic.  Eyes:     General:        Right eye: No discharge.        Left eye: No discharge.  Cardiovascular:     Rate and Rhythm: Normal rate and regular rhythm.     Pulses: Normal pulses.          Radial pulses are 2+ on the right side and 2+ on the left side.       Dorsalis pedis pulses are 2+ on the  right side and 2+ on the left side.     Heart sounds: Normal heart sounds. No murmur heard.   No friction rub. No gallop.  Pulmonary:     Effort: Pulmonary effort is normal. No respiratory distress.     Breath sounds: Normal breath sounds. No wheezing or rales.     Comments: Respirations equal and unlabored, patient able to speak in full sentences, lungs clear to auscultation bilaterally  Chest:     Chest wall: No tenderness.  Abdominal:     General: Bowel sounds are normal. There is no distension.     Palpations: Abdomen  is soft. There is no mass.     Tenderness: There is no abdominal tenderness. There is no guarding.     Comments: Abdomen soft, nondistended, nontender to palpation in all quadrants without guarding or peritoneal signs  Musculoskeletal:        General: No deformity.     Cervical back: Neck supple.     Right lower leg: No tenderness. No edema.     Comments: Left leg with trace edema which patient reports is chronic and unchanged, nontender  Skin:    General: Skin is warm and dry.     Capillary Refill: Capillary refill takes less than 2 seconds.  Neurological:     Mental Status: He is alert and oriented to person, place, and time.     Coordination: Coordination normal.     Comments: Speech is clear, able to follow commands CN III-XII intact Normal strength in upper and lower extremities bilaterally including dorsiflexion and plantar flexion, strong and equal grip strength Sensation normal to light and sharp touch Moves extremities without ataxia, coordination intact  Psychiatric:        Mood and Affect: Mood normal.        Behavior: Behavior normal.    ED Results / Procedures / Treatments   Labs (all labs ordered are listed, but only abnormal results are displayed) Labs Reviewed  BASIC METABOLIC PANEL - Abnormal; Notable for the following components:      Result Value   Glucose, Bld 157 (*)    BUN 23 (*)    Creatinine, Ser 1.91 (*)    GFR, Estimated 46 (*)     All other components within normal limits  CBC - Abnormal; Notable for the following components:   Hemoglobin 12.8 (*)    HCT 38.3 (*)    All other components within normal limits  TROPONIN I (HIGH SENSITIVITY)  TROPONIN I (HIGH SENSITIVITY)    EKG EKG Interpretation  Date/Time:  Monday December 22 2020 06:28:53 EDT Ventricular Rate:  81 PR Interval:  158 QRS Duration: 96 QT Interval:  394 QTC Calculation: 457 R Axis:   197 Text Interpretation: Normal sinus rhythm Incomplete right bundle branch block Septal infarct , age undetermined Possible Lateral infarct , age undetermined Abnormal ECG NSR, similar to previous Confirmed by Lavenia Atlas [phone]) on 12/22/2020 7:23:49 AM  Radiology DG Chest 2 View  Result Date: 12/22/2020 CLINICAL DATA:  35 year old male with history of chest pain radiating into the left arm and back. Shortness of breath. Nausea. Headache. EXAM: CHEST - 2 VIEW COMPARISON:  Chest x-ray 10/30/2020. FINDINGS: Lung volumes are normal. No consolidative airspace disease. No pleural effusions. No pneumothorax. No pulmonary nodule or mass noted. Pulmonary vasculature and the cardiomediastinal silhouette are within normal limits. IMPRESSION: No radiographic evidence of acute cardiopulmonary disease. Electronically Signed   By: Vinnie Langton M.D.   On: 12/22/2020 06:48    Procedures Procedures   Medications Ordered in ED Medications  aspirin 81 MG chewable tablet (  Not Given 12/22/20 0931)  aspirin chewable tablet 324 mg (324 mg Oral Given 12/22/20 0701)  pantoprazole (PROTONIX) injection 40 mg (40 mg Intravenous Given 12/22/20 0856)  alum & mag hydroxide-simeth (MAALOX/MYLANTA) 200-200-20 MG/5ML suspension 30 mL (30 mLs Oral Given 12/22/20 0853)    And  lidocaine (XYLOCAINE) 2 % viscous mouth solution 15 mL (15 mLs Oral Given 12/22/20 [id number])    ED Course  I have reviewed the triage vital signs and the nursing notes.  Pertinent labs & imaging  results that were  available during my care of the patient were reviewed by me and considered in my medical decision making (see chart for details).   MDM Rules/Calculators/A&P                          Patient presents to the emergency department with chest pain. Patient nontoxic appearing, in no apparent distress, vitals without significant abnormality. Fairly benign physical exam.   Prior heart catheterization reviewed: Had 100% LAD stenosis with late presenting MI, PCI successful, moderate to severe LV systolic dysfunction and anterior wall hypokinesis noted, minimal blockages noted elsewhere Patient's primary cardiologist: Dr. Scarlette Calico  DDX: ACS, pulmonary embolism, dissection, pneumothorax, pneumonia, arrhythmia, severe anemia, MSK, GERD, anxiety, abdominal process.   Additional history obtained:  Additional history obtained from chart review & nursing note review.   EKG: Normal sinus rhythm with partial right bundle branch block, some nonspecific ST changes that are unchanged when compared to prior EKG  Lab Tests:  I Ordered, reviewed, and interpreted labs, which included:  CBC: No leukocytosis, stable hemoglobin BMP: No significant electrolyte derangements, creatinine of 1.91, typically at baseline of 1.7 Troponin: Initial troponin 11, delta troponin 12, reassuring trend  Imaging Studies ordered:  I ordered imaging studies which included CXR, I independently reviewed, formal radiology impression shows:  No acute active cardiopulmonary disease  ED Course:   Heart Pathway Score - 5  Patient did have similar pain and was seen in the ED on 5/19 and treated for GERD with improvement.  He had been taking Protonix but ran out of these medications about a week ago.  We will also treat with Protonix and GI cocktail to see if this improves symptoms.  Patient did not get any improvement after aspirin.  Patient does have history of previous MI, reviewed cath report, patient had LAD occlusion which was  resolved with PCI, but did not have any other significant stenosis noted, has been compliant with Plavix.  EKG without concerning changes today and troponins flat.  Pain resolved with treatment with GI cocktail and Protonix and cardiac work-up today has been very reassuring, will represcribe patient's Protonix, but feel this is more likely the cause of his symptoms, will have patient follow-up very closely with his cardiologist though and discussed strict return precautions.  He expresses understanding and agreement with this plan.  Discharged home in good condition.   Portions of this note were generated with [employer]. Dictation errors may occur despite best attempts at proofreading.    Final Clinical Impression(s) / ED Diagnoses Final diagnoses:  Central chest pain    Rx / DC Orders ED Discharge Orders          Ordered    pantoprazole (PROTONIX) 20 MG tablet  2 times daily        12/22/20 1110             Jacqlyn Larsen, [place] 12/22/20 1112    Horton, Alvin Critchley, DO 12/25/20 1344

## 2020-12-22 NOTE — ED Provider Notes (Signed)
Emergency Medicine Provider Triage Evaluation Note  [name] , a 35 y.o. male  was evaluated in triage.  Pt complains of chest pain and pressure that started 6 hours ago.  Hx of the same and has had a stent placed about a year ago.  Feels similar.  Sees Dr. Scarlette Calico.  Nothing makes the symptoms better or worse.  Review of Systems  Positive: CP, pressure Negative: Fever, cough  Physical Exam  There were no vitals taken for this visit. Gen:   Awake, no distress   Resp:  Normal effort  MSK:   Moves extremities without difficulty  Other:    Medical Decision Making  Medically screening exam initiated at 6:29 AM.  Appropriate orders placed.  Einar Pheasant Shaquille Marchetta was informed that the remainder of the evaluation will be completed by another provider, this initial triage assessment does not replace that evaluation, and the importance of remaining in the ED until their evaluation is complete.  Left heart cath with stent 01/28/20.    Montine Circle, PA-C 12/22/20 0631    Merrily Pew, MD 12/22/20 [phone]

## 2021-01-02 ENCOUNTER — Ambulatory Visit: Payer: 59 | Admitting: Interventional Cardiology

## 2021-01-20 ENCOUNTER — Other Ambulatory Visit: Payer: Self-pay | Admitting: Cardiology

## 2021-01-26 ENCOUNTER — Emergency Department (HOSPITAL_COMMUNITY)
Admission: EM | Admit: 2021-01-26 | Discharge: 2021-01-26 | Disposition: A | Discharge status: Home / Self Care | Payer: 59 | Attending: Emergency Medicine | Admitting: Emergency Medicine

## 2021-01-26 ENCOUNTER — Encounter (HOSPITAL_COMMUNITY): Payer: Self-pay | Admitting: *Deleted

## 2021-01-26 ENCOUNTER — Emergency Department (HOSPITAL_COMMUNITY): Payer: 59

## 2021-01-26 ENCOUNTER — Telehealth: Payer: Self-pay | Admitting: Interventional Cardiology

## 2021-01-26 DIAGNOSIS — I10 Essential (primary) hypertension: Secondary | ICD-10-CM | POA: Diagnosis not present

## 2021-01-26 DIAGNOSIS — R0789 Other chest pain: Secondary | ICD-10-CM | POA: Diagnosis present

## 2021-01-26 DIAGNOSIS — Z794 Long term (current) use of insulin: Secondary | ICD-10-CM | POA: Diagnosis not present

## 2021-01-26 DIAGNOSIS — I2510 Atherosclerotic heart disease of native coronary artery without angina pectoris: Secondary | ICD-10-CM | POA: Diagnosis not present

## 2021-01-26 DIAGNOSIS — Z7902 Long term (current) use of antithrombotics/antiplatelets: Secondary | ICD-10-CM | POA: Insufficient documentation

## 2021-01-26 DIAGNOSIS — E119 Type 2 diabetes mellitus without complications: Secondary | ICD-10-CM | POA: Insufficient documentation

## 2021-01-26 DIAGNOSIS — Z79899 Other long term (current) drug therapy: Secondary | ICD-10-CM | POA: Insufficient documentation

## 2021-01-26 DIAGNOSIS — Z7982 Long term (current) use of aspirin: Secondary | ICD-10-CM | POA: Insufficient documentation

## 2021-01-26 DIAGNOSIS — Z7984 Long term (current) use of oral hypoglycemic drugs: Secondary | ICD-10-CM | POA: Diagnosis not present

## 2021-01-26 DIAGNOSIS — R079 Chest pain, unspecified: Secondary | ICD-10-CM

## 2021-01-26 DIAGNOSIS — I25118 Atherosclerotic heart disease of native coronary artery with other forms of angina pectoris: Secondary | ICD-10-CM

## 2021-01-26 LAB — CBC
HCT: 37.6 % — ABNORMAL LOW (ref 39.0–52.0)
Hemoglobin: 12.6 g/dL — ABNORMAL LOW (ref 13.0–17.0)
MCH: 28.6 pg (ref 26.0–34.0)
MCHC: 33.5 g/dL (ref 30.0–36.0)
MCV: 85.3 fL (ref 80.0–100.0)
Platelets: 215 10*3/uL (ref 150–400)
RBC: 4.41 MIL/uL (ref 4.22–5.81)
RDW: 12.7 % (ref 11.5–15.5)
WBC: 6.5 10*3/uL (ref 4.0–10.5)
nRBC: 0 % (ref 0.0–0.2)

## 2021-01-26 LAB — BASIC METABOLIC PANEL
Anion gap: 9 (ref 5–15)
BUN: 22 mg/dL — ABNORMAL HIGH (ref 6–20)
CO2: 24 mmol/L (ref 22–32)
Calcium: 9.1 mg/dL (ref 8.9–10.3)
Chloride: 104 mmol/L (ref 98–111)
Creatinine, Ser: 1.71 mg/dL — ABNORMAL HIGH (ref 0.61–1.24)
GFR, Estimated: 52 mL/min — ABNORMAL LOW (ref >60)
Glucose, Bld: 247 mg/dL — ABNORMAL HIGH (ref 70–99)
Potassium: 3.8 mmol/L (ref 3.5–5.1)
Sodium: 137 mmol/L (ref 135–145)

## 2021-01-26 LAB — TROPONIN I (HIGH SENSITIVITY)
Troponin I (High Sensitivity): 10 ng/L (ref <18)
Troponin I (High Sensitivity): 10 ng/L (ref <18)

## 2021-01-26 MED ORDER — PANTOPRAZOLE SODIUM 20 MG PO TBEC: 20.0000 mg | DELAYED_RELEASE_TABLET | Freq: Two times a day (BID) | ORAL | 0 refills | Status: DC

## 2021-01-26 MED ORDER — NITROGLYCERIN 0.4 MG SL SUBL: 0.4000 mg | SUBLINGUAL_TABLET | SUBLINGUAL | Status: DC | PRN

## 2021-01-26 NOTE — Telephone Encounter (Signed)
Please set up exercise myoview for this patient for CAD with other forms of angina.  He is being sent home from ER today and I see him next week.

## 2021-01-26 NOTE — ED Notes (Signed)
Checked on patient. Pain the same in chest and arm. No distress.

## 2021-01-26 NOTE — ED Provider Notes (Signed)
[facility] EMERGENCY DEPARTMENT Provider Note   CSN: [id number] Arrival date & time: 01/26/21  1029     History Chief Complaint  Patient presents with   Chest Pain   Shortness of Breath    [name] is a 35 y.o. male.  HPI  35 year old male with past medical history of obesity, HTN, HLD, DM, CAD with previous LAD lesion status post stent about a year ago presents to the emergency department midsternal chest heaviness.  Patient states intermittently for the past year he has been having episodes of chest heaviness.  It sometimes radiates into his left arm and causes shortness of breath.  Currently he states the discomfort is almost completely resolved but he still has a mild soreness in the middle of his chest.  Denies any recent illness.  He has chronic swelling of his bilateral ankles that he feels has been baseline and not acutely changed.  No other leg swelling/PE symptoms.  No back or abdominal pain.  Past Medical History:  Diagnosis Date   Coronary artery disease    Diabetes mellitus without complication ([place])    Hypercholesteremia    Hypertension    Myocardial infarction ([place]) 01/2020    Patient Active Problem List   Diagnosis Date Noted   Acute anterior wall MI ([place]) 01/28/2020   Gastroenteritis 07/20/2018   AKI (acute kidney injury) ([place]) 07/19/2018   Chest pain 07/19/2018   Hyperlipidemia 01/14/2015   Obesity (BMI 30-39.9) 01/14/2015   Insulin-requiring or dependent type II diabetes mellitus ([place]) 01/14/2015   Dyslipidemia 08/31/2014   HTN (hypertension) 08/31/2014   Diabetes ([place]) 08/15/2014    Past Surgical History:  Procedure Laterality Date   CORONARY STENT INTERVENTION N/A 01/28/2020   Procedure: CORONARY STENT INTERVENTION;  Surgeon: [name], MD;  Location: [place] [facility];  Service: Cardiovascular;  Laterality: N/A;   CORONARY/GRAFT ACUTE MI REVASCULARIZATION N/A 01/28/2020   Procedure: Coronary/Graft Acute MI  Revascularization;  Surgeon: [name], MD;  Location: [place] [facility];  Service: Cardiovascular;  Laterality: N/A;   LEFT HEART CATH AND CORONARY ANGIOGRAPHY N/A 01/28/2020   Procedure: LEFT HEART CATH AND CORONARY ANGIOGRAPHY;  Surgeon: [name], MD;  Location: [place] [facility];  Service: Cardiovascular;  Laterality: N/A;       Family History  Problem Relation Age of Onset   Diabetes Father     Social History   Tobacco Use   Smoking status: Never   Smokeless tobacco: Never  Vaping Use   Vaping Use: Never used  Substance Use Topics   Alcohol use: No   Drug use: No    Home Medications Prior to Admission medications   Medication Sig Start Date End Date Taking? Authorizing Provider  aspirin EC 81 MG tablet Take 81 mg by mouth daily. Swallow whole.   Yes [provider]  atorvastatin (LIPITOR) 80 MG tablet TAKE 1 TABLET(80 MG) BY MOUTH DAILY 01/20/21  Yes [name], MD  carvedilol (COREG) 3.125 MG tablet TAKE 1 TABLET(3.125 MG) BY MOUTH TWICE DAILY WITH A MEAL Patient taking differently: Take 3.125 mg by mouth 2 (two) times daily with a meal. 10/01/20  Yes [name], MD  clopidogrel (PLAVIX) 75 MG tablet TAKE 1 TABLET(75 MG) BY MOUTH DAILY 01/20/21  Yes [name], MD  LANTUS SOLOSTAR 100 UNIT/ML Solostar Pen Inject 50 Units into the skin at bedtime. 08/09/14  Yes [provider]  NOVOLOG FLEXPEN 100 UNIT/ML FlexPen Inject 5-10  Units into the skin See admin instructions. Inject 5-10 units into the skin three times a day before meals, per sliding scale 02/24/18  Yes [provider]  aspirin 81 MG tablet Take 1 tablet (81 mg total) by mouth daily. Patient not taking: Reported on 01/26/2021 08/25/17   Tobie Poet, DO  metFORMIN (GLUCOPHAGE-XR) 500 MG 24 hr tablet Take 500 mg by mouth 2 (two) times daily. 02/21/20   [provider]  pantoprazole (PROTONIX) 20 MG tablet Take 1 tablet (20 mg total) by  mouth 2 (two) times daily. 01/26/21   Quetzal Meany, Alvin Critchley, DO  potassium chloride SA (KLOR-CON) 20 MEQ tablet Take 1 tablet (20 mEq total) by mouth once for 1 dose. 05/17/20 05/17/20  Garald Balding, PA-C  sucralfate (CARAFATE) 1 GM/10ML suspension Take 10 mLs (1 g total) by mouth 4 (four) times daily -  with meals and at bedtime. Patient not taking: No sig reported 10/30/20   Palumbo, April, MD    Allergies    Patient has no known allergies.  Review of Systems   Review of Systems  Constitutional:  Negative for chills and fever.  HENT:  Negative for congestion.   Eyes:  Negative for visual disturbance.  Respiratory:  Negative for shortness of breath.   Cardiovascular:  Positive for chest pain. Negative for palpitations and leg swelling.  Gastrointestinal:  Negative for abdominal pain, diarrhea and vomiting.  Genitourinary:  Negative for dysuria.  Musculoskeletal:  Negative for back pain and neck pain.  Skin:  Negative for rash.  Neurological:  Negative for headaches.   Physical Exam Updated Vital Signs BP (!) 154/97   Pulse 72   Temp 98.9 F (37.2 C) (Oral)   Resp 12   SpO2 98%   Physical Exam Vitals and nursing note reviewed.  Constitutional:      Appearance: Normal appearance. He is obese.  HENT:     Head: Normocephalic.     Mouth/Throat:     Mouth: Mucous membranes are moist.  Cardiovascular:     Rate and Rhythm: Normal rate.  Pulmonary:     Effort: Pulmonary effort is normal. No respiratory distress.  Abdominal:     Palpations: Abdomen is soft.     Tenderness: There is no abdominal tenderness.  Skin:    General: Skin is warm.  Neurological:     Mental Status: He is alert and oriented to person, place, and time. Mental status is at baseline.  Psychiatric:        Mood and Affect: Mood normal.    ED Results / Procedures / Treatments   Labs (all labs ordered are listed, but only abnormal results are displayed) Labs Reviewed  BASIC METABOLIC PANEL - Abnormal;  Notable for the following components:      Result Value   Glucose, Bld 247 (*)    BUN 22 (*)    Creatinine, Ser 1.71 (*)    GFR, Estimated 52 (*)    All other components within normal limits  CBC - Abnormal; Notable for the following components:   Hemoglobin 12.6 (*)    HCT 37.6 (*)    All other components within normal limits  TROPONIN I (HIGH SENSITIVITY)  TROPONIN I (HIGH SENSITIVITY)    EKG EKG Interpretation  Date/Time:  Monday January 26 2021 11:08:00 EDT Ventricular Rate:  84 PR Interval:  170 QRS Duration: 90 QT Interval:  374 QTC Calculation: 441 R Axis:   229 Text Interpretation: Normal sinus rhythm Right superior axis deviation  Anteroseptal infarct , age undetermined Abnormal ECG Similar to previous Confirmed by Lavenia Atlas [phone]) on 01/26/2021 1:33:45 PM  Radiology DG Chest 2 View  Result Date: 01/26/2021 CLINICAL DATA:  Chest pain. EXAM: CHEST - 2 VIEW COMPARISON:  December 22, 2020. FINDINGS: The heart size and mediastinal contours are within normal limits. Both lungs are clear. The visualized skeletal structures are unremarkable. IMPRESSION: No active cardiopulmonary disease. Electronically Signed   By: Marijo Conception M.D.   On: 01/26/2021 12:08    Procedures Procedures   Medications Ordered in ED Medications  nitroGLYCERIN (NITROSTAT) SL tablet 0.4 mg (has no administration in time range)    ED Course  I have reviewed the triage vital signs and the nursing notes.  Pertinent labs & imaging results that were available during my care of the patient were reviewed by me and considered in my medical decision making (see chart for details).    MDM Rules/Calculators/A&P                           35 year old male with previous stent to the LAD 1 year ago presents to the emergency department with intermittent chest heaviness.  This was present prior to arrival, improved and currently just a mild chest soreness.  Vitals are otherwise stable on arrival.  EKG is  unchanged from previous.  Chest x-ray is unremarkable.  No findings of heart failure.  He is PERC negative with no respiratory symptoms/hypoxia.  Blood work is reassuring, troponin is negative x2 with no delta.  Spoke with Dr. Irish Lack who reviewed the patient's cath report and current work-up.  On his cath a year ago the only lesion was the LAD which was stented, there was otherwise minimal disease.  His work-up today is very reassuring, no signs of active ACS.  Since the patient is feeling improved and would rather go home the plan will be to schedule the patient for an outpatient stress test and he will follow-up at his already scheduled appointment with Dr. Irish Lack next week.  Patient at this time appears safe and stable for discharge and will be treated as an outpatient.  Discharge plan and strict return to ED precautions discussed, patient verbalizes understanding and agreement.  Final Clinical Impression(s) / ED Diagnoses Final diagnoses:  Chest pain, unspecified type    Rx / DC Orders ED Discharge Orders          Ordered    pantoprazole (PROTONIX) 20 MG tablet  2 times daily        01/26/21 [place], Alvin Critchley, DO 01/26/21 1621

## 2021-01-26 NOTE — Telephone Encounter (Signed)
Order placed

## 2021-01-26 NOTE — ED Provider Notes (Signed)
Emergency Medicine Provider Triage Evaluation Note  Faruk Mcneil , a 35 y.o. male  was evaluated in triage.  Pt complains of chest pain.  Pt has a history a stent   Review of Systems  Positive: Chest pain Negative: fever  Physical Exam  BP (!) 176/109 (BP Location: Left Arm)   Pulse 85   Temp 98.9 F (37.2 C) (Oral)   Resp 16   SpO2 99%  Gen:   Awake, no distress   Resp:  Normal effort  MSK:   Moves extremities without difficulty  Other:    Medical Decision Making  Medically screening exam initiated at 11:23 AM.  Appropriate orders placed.  Einar Pheasant Jantzen Heon was informed that the remainder of the evaluation will be completed by another provider, this initial triage assessment does not replace that evaluation, and the importance of remaining in the ED until their evaluation is complete.     Fransico Meadow, PA-C 01/26/21 1125    Lorelle Gibbs, DO 01/28/21 2324

## 2021-01-26 NOTE — ED Triage Notes (Signed)
Pt reports mid chest pain, sob and feet swelling x 2 days. Has cardiac hx. No resp distress is noted at triage.

## 2021-01-26 NOTE — Discharge Instructions (Signed)
You have been seen and discharged from the emergency department.  You will be scheduled for stress test, follow-up as an outpatient with Dr. Irish Lack.  Take your home medications as prescribed.  If you have any worsening symptoms, worsening chest pain, difficulty breathing or further concerns for your health please return to an emergency department for further evaluation.

## 2021-01-27 NOTE — Telephone Encounter (Signed)
I placed call to patient but was unable to leave message as mailbox is full.  Nuclear scheduling department has also tried to reach patient

## 2021-01-28 ENCOUNTER — Encounter (HOSPITAL_COMMUNITY): Payer: Self-pay

## 2021-02-01 NOTE — Progress Notes (Signed)
Cardiology Office Note   Date:  02/02/2021   ID:  [name], [name] September 04, 1985, MRN [id number]  PCP:  Greg Loader, FNP    No chief complaint on file.  CAD/MI  Wt Readings from Last 3 Encounters:  02/02/21 (!) 329 lb 6.4 oz (149.4 kg)  12/22/20 300 lb (136.1 kg)  10/30/20 289 lb 14.5 oz (131.5 kg)       History of Present Illness: [name] is a 35 y.o. male  with obesity, IDDM, HTN, HLD, family history of CAD  Who began having chest pain 1 day prior to admission. Due to persistent pain, came to the hospital where EKG demonstrated anterior Q waves and marked ST elevation. Cath showed 100% prox LAD (late presenting MI), s/p DES, LVEF 25-35%, mildly elevated LVEDP. 2D echo showed EF 30-35% with anterior/septal akinesis, normal RV function, small pericardial effusion. Also found to have AKI, uncontrolled DM, severe HLD.    He was not taking his BP readings or taking lisinopril regularly prior to the MI.    Cath results showed: "Prox LAD lesion is 100% stenosed. Late presenting anterior MI, as his symptoms have been going on for about 24 hours. A drug-eluting stent was successfully placed using a SYNERGY XD [id number], postdilated to greater than 3 mm in diameter. Post intervention, there is a 0% residual stenosis. There is moderate to severe left ventricular systolic dysfunction with anterior wall hypokinesis. LV end diastolic pressure is mildly elevated. The left ventricular ejection fraction is 25-35% by visual estimate. There is no aortic valve stenosis.   Watch for 48 hours.  Check echocardiogram.  May need to consider LifeVest.   Holding lisinopril at this time since his creatinine was elevated in the Cath Lab at the time of procedure.   1 year of dual antiplatelet therapy recommended along with aggressive secondary prevention."  Weight has increased.  BP has been high.  He had some stress with father requiring LVAD.  He is better. He got depresssed and  stopped taking good care of himself.   Denies : Dizziness. Leg edema. Nitroglycerin use. Orthopnea. Palpitations. Paroxysmal nocturnal dyspnea. Shortness of breath. Syncope.      Past Medical History:  Diagnosis Date   Coronary artery disease    Diabetes mellitus without complication ([place])    Hypercholesteremia    Hypertension    Myocardial infarction ([place]) 01/2020    Past Surgical History:  Procedure Laterality Date   CORONARY STENT INTERVENTION N/A 01/28/2020   Procedure: CORONARY STENT INTERVENTION;  Surgeon: Greg Booze, MD;  Location: [place] [facility];  Service: Cardiovascular;  Laterality: N/A;   CORONARY/GRAFT ACUTE MI REVASCULARIZATION N/A 01/28/2020   Procedure: Coronary/Graft Acute MI Revascularization;  Surgeon: Greg Booze, MD;  Location: [place] [facility];  Service: Cardiovascular;  Laterality: N/A;   LEFT HEART CATH AND CORONARY ANGIOGRAPHY N/A 01/28/2020   Procedure: LEFT HEART CATH AND CORONARY ANGIOGRAPHY;  Surgeon: Greg Booze, MD;  Location: [place] [facility];  Service: Cardiovascular;  Laterality: N/A;     Current Outpatient Medications  Medication Sig Dispense Refill   aspirin EC 81 MG tablet Take 81 mg by mouth daily. Swallow whole.     atorvastatin (LIPITOR) 80 MG tablet TAKE 1 TABLET(80 MG) BY MOUTH DAILY 90 tablet 3   carvedilol (COREG) 3.125 MG tablet TAKE 1 TABLET(3.125 MG) BY MOUTH TWICE DAILY WITH A MEAL (Patient taking differently: Take 3.125 mg by mouth 2 (two) times daily with a  meal.) 180 tablet 1   clopidogrel (PLAVIX) 75 MG tablet TAKE 1 TABLET(75 MG) BY MOUTH DAILY 90 tablet 3   LANTUS SOLOSTAR 100 UNIT/ML Solostar Pen Inject 50 Units into the skin at bedtime.     metFORMIN (GLUCOPHAGE-XR) 500 MG 24 hr tablet Take 500 mg by mouth 2 (two) times daily.     NOVOLOG FLEXPEN 100 UNIT/ML FlexPen Inject 5-10 Units into the skin See admin instructions. Inject 5-10 units into the skin three times a day before meals, per sliding  scale  0   pantoprazole (PROTONIX) 20 MG tablet Take 1 tablet (20 mg total) by mouth 2 (two) times daily. (Patient not taking: Reported on 02/02/2021) 60 tablet 0   No current facility-administered medications for this visit.    Allergies:   Patient has no known allergies.    Social History:  The patient  reports that he has never smoked. He has never used smokeless tobacco. He reports that he does not drink alcohol and does not use drugs.   Family History:  The patient's family history includes Diabetes in his father.    ROS:  Please see the history of present illness.   Otherwise, review of systems are positive for weight gain.   All other systems are reviewed and negative.    PHYSICAL EXAM: VS:  BP (!) 180/110   Pulse 85   Ht '6\' 4"'$  (1.93 m)   Wt (!) 329 lb 6.4 oz (149.4 kg)   SpO2 97%   BMI 40.10 kg/m  , BMI Body mass index is 40.1 kg/m. GEN: Well nourished, well developed, in no acute distress HEENT: normal Neck: no JVD, carotid bruits, or masses Cardiac: RRR; no murmurs, rubs, or gallops,no edema  Respiratory:  clear to auscultation bilaterally, normal work of breathing GI: soft, nontender, nondistended, + BS MS: no deformity or atrophy Skin: warm and dry, no rash Neuro:  Strength and sensation are intact Psych: euthymic mood, full affect   EKG:   The ekg ordered today demonstrates NSR, PRWP, residual ST elevation anteriorly suggestive of apical aneurysm   Recent Labs: 04/01/2020: ALT 27 01/26/2021: BUN 22; Creatinine, Ser 1.71; Hemoglobin 12.6; Platelets 215; Potassium 3.8; Sodium 137   Lipid Panel    Component Value Date/Time   CHOL 131 04/01/2020 0956   TRIG 242 (H) 04/01/2020 0956   HDL 24 (L) 04/01/2020 0956   CHOLHDL 5.5 (H) 04/01/2020 0956   CHOLHDL 6.4 01/29/2020 0731   VLDL 69 (H) 01/29/2020 0731   LDLCALC 67 04/01/2020 0956   LDLDIRECT 118.2 (H) 01/28/2020 1242     Other studies Reviewed: Additional studies/ records that were reviewed today  with results demonstrating: Lab work reviewed.  LDL 67.   ASSESSMENT AND PLAN:  CAD/Old MI: Episode of  angina with negative troponins. Increase Coreg 12.5 mg BID.  He felt chest pain that was similar to his prior MI.  Stress test tomorrow.  I instructed him to continue his beta-blocker even the day of his stress test to avoid him having such a high blood pressure that he would not be allowed to walk on the treadmill.  If he is unable to reach target heart rate, could change him to a Lexiscan. Chronic systolic dysfunction: Medications to treat heart failure have been limited by chronic renal insufficiency.  Creatinine has improved to 1.7.  Could perhaps consider trying low-dose Entresto.  He will follow-up with the Pharm.D. hypertension clinic and we will see if based on his insurance, that would be  a possibility.  Otherwise, could consider hydralazine as well. Hyperlipidemia: Continue lipid-lowering therapy. Hypertension: As noted above.  Avoid processed foods.  Low-salt diet.  Whole food, plant-based diet. DM: A1C 10.1.  Fiber intake and increased exercise to the target below should also help.  He gets about 6000 steps per day.  Morbidly obese.  Weight loss will help as well. CRI: Avoid nephrotoxins like NSAIDs.   Current medicines are reviewed at length with the patient today.  The patient concerns regarding his medicines were addressed.  The following changes have been made:  as above  Labs/ tests ordered today include:  No orders of the defined types were placed in this encounter.   Recommend 150 minutes/week of aerobic exercise Low fat, low carb, high fiber diet recommended  Disposition:   FU in for stress test, PharmD HTN clinic   Signed, Larae Grooms, MD  02/02/2021 9:22 AM    [facility] [address], [place], [place]  [address] Phone: [phone]; Fax: [phone]

## 2021-02-02 ENCOUNTER — Other Ambulatory Visit: Payer: Self-pay

## 2021-02-02 ENCOUNTER — Encounter: Payer: Self-pay | Admitting: Interventional Cardiology

## 2021-02-02 ENCOUNTER — Ambulatory Visit (INDEPENDENT_AMBULATORY_CARE_PROVIDER_SITE_OTHER): Payer: 59 | Admitting: Interventional Cardiology

## 2021-02-02 VITALS — BP 180/110 | HR 85 | Ht 76.0 in | Wt 329.4 lb

## 2021-02-02 DIAGNOSIS — I25118 Atherosclerotic heart disease of native coronary artery with other forms of angina pectoris: Secondary | ICD-10-CM

## 2021-02-02 DIAGNOSIS — I252 Old myocardial infarction: Secondary | ICD-10-CM

## 2021-02-02 DIAGNOSIS — I110 Hypertensive heart disease with heart failure: Secondary | ICD-10-CM

## 2021-02-02 DIAGNOSIS — E785 Hyperlipidemia, unspecified: Secondary | ICD-10-CM

## 2021-02-02 DIAGNOSIS — E782 Mixed hyperlipidemia: Secondary | ICD-10-CM

## 2021-02-02 DIAGNOSIS — I5022 Chronic systolic (congestive) heart failure: Secondary | ICD-10-CM | POA: Diagnosis not present

## 2021-02-02 MED ORDER — CARVEDILOL 12.5 MG PO TABS: 12.5000 mg | ORAL_TABLET | Freq: Two times a day (BID) | ORAL | 3 refills | Status: DC

## 2021-02-02 MED ORDER — NITROGLYCERIN 0.4 MG SL SUBL: 0.4000 mg | SUBLINGUAL_TABLET | SUBLINGUAL | 3 refills | Status: AC | PRN

## 2021-02-02 NOTE — Patient Instructions (Signed)
Medication Instructions:   Increase your Coreg (carvedilol) to 12.5 mg twice a day  *If you need a refill on your cardiac medications before your next appointment, please call your pharmacy*   Lab Work: none If you have labs (blood work) drawn today and your tests are completely normal, you will receive your results only by: [facility] [place] (if you have MyChart) OR A paper copy in the mail If you have any lab test that is abnormal or we need to change your treatment, we will call you to review the results.   Testing/Procedures: Take you Coreg/ Carvedilol the morning fo your stress test 02/03/21   Follow-Up: At [facility], you and your health needs are our priority.  As part of our continuing mission to provide you with exceptional heart care, we have created designated Provider Care Teams.  These Care Teams include your primary Cardiologist (physician) and Advanced Practice Providers (APPs -  Physician Assistants and Nurse Practitioners) who all work together to provide you with the care you need, when you need it.  We recommend signing up for the patient portal called "MyChart".  Sign up information is provided on this After Visit Summary.  MyChart is used to connect with patients for Virtual Visits (Telemedicine).  Patients are able to view lab/test results, encounter notes, upcoming appointments, etc.  Non-urgent messages can be sent to your provider as well.   To learn more about what you can do with MyChart, go to [email or web address].    Your next appointment:   2-3 week(s)  The format for your next appointment:   In Person  Provider:   Pharm D HTN Clinic   Other Instructions

## 2021-02-03 ENCOUNTER — Ambulatory Visit (HOSPITAL_COMMUNITY): Payer: 59

## 2021-02-04 ENCOUNTER — Encounter (HOSPITAL_COMMUNITY): Payer: Self-pay | Admitting: Interventional Cardiology

## 2021-02-04 ENCOUNTER — Ambulatory Visit (HOSPITAL_COMMUNITY): Payer: 59

## 2021-02-10 NOTE — Telephone Encounter (Signed)
Patient saw Dr Irish Lack on 02/02/21

## 2021-02-18 ENCOUNTER — Telehealth (HOSPITAL_COMMUNITY): Payer: Self-pay | Admitting: Interventional Cardiology

## 2021-02-18 NOTE — Telephone Encounter (Signed)
Just an FYI. We have made several attempts to contact this patient including sending a letter to schedule or reschedule their Myoview. We will be removing the patient from the echo/nuc WQ.   02/03/21 NO SHOWED-MAILED LETTER LBW  01/28/21 Called and VM is ful so I sent SMS @ 8:33/LBW  01/27/21 called and VM is full and unable to Leave message @ 3:27/LBW SENT SMS  01/27/21 called patient and VM is ful and unable to leave message @ 8:41/LBW     Thank you

## 2021-02-24 ENCOUNTER — Ambulatory Visit: Payer: 59 | Admitting: Pharmacist

## 2021-02-24 NOTE — Progress Notes (Deleted)
Patient ID: [name]                 DOB: 1986-05-05                      MRN: [id number]     HPI: [name] is a 35 y.o. male referred by Dr. Irish Lack to HTN clinic. PMH is significant for CAD s/p STEMI 01/2020 with 100% prox LAD s/p DES, LVEF 25-35% at the time, also found to have uncontrolled DM (A1c 10.1%) and severe HLD at that time. Also has hx of HTN, CKD (SCr 1.5-2), obesity (BMI 40). LVEF 35-40% on 03/2020 echo. BP was severely elevated at last visit on 8/22 at 180/110 and his carvedilol was increased to 12.'5mg'$  BID. He was scheduled for stress test on 8/23 due to chest pain but no showed.   Add zetia and vascepa - [employer] Add entresto with close eye on SCr, also inc coreg, can also try low dose spiro Otherwise hydral Is DM being followed closely?  Current HTN/CHF meds: carvedilol 12.'5mg'$  BID  BP goal: <130/80mHg  Family History: Diabetes and CHF in his father  Social History: The patient  reports that he has never smoked. He has never used smokeless tobacco. He reports that he does not drink alcohol and does not use drugs.  Diet:   Exercise:   Home BP readings:   Labs: 01/29/20: TC 290, TG 344, HDL 45, LDL 176 (no LLT) 04/01/20: TC 131, TG 242, HDL 24, LDL 67 (atorva '80mg'$  daily)  Wt Readings from Last 3 Encounters:  02/02/21 (!) 329 lb 6.4 oz (149.4 kg)  12/22/20 300 lb (136.1 kg)  10/30/20 289 lb 14.5 oz (131.5 kg)   BP Readings from Last 3 Encounters:  02/02/21 (!) 180/110  01/26/21 (!) 154/97  12/22/20 (!) 158/110   Pulse Readings from Last 3 Encounters:  02/02/21 85  01/26/21 72  12/22/20 72    Renal function: CrCl cannot be calculated (Patient's most recent lab result is older than the maximum 21 days allowed.).  Past Medical History:  Diagnosis Date   Coronary artery disease    Diabetes mellitus [place] complication [place]    Hypercholesteremia    Hypertension    [place]ocardial [place] 01/[place]    Current  Outpatient Medications on File Prior to Visit  Medication Sig Dispense Refill   aspirin EC 81 MG tablet Take 81 mg by mouth daily. Swallow whole.     atorvastatin (LIPITOR) 80 MG tablet TAKE 1 TABLET(80 MG) BY MOUTH DAILY 90 tablet 3   carvedilol (COREG) 12.5 MG tablet Take 1 tablet (12.5 mg total) by mouth 2 (two) times daily. 180 tablet 3   clopidogrel (PLAVIX) 75 MG tablet TAKE 1 TABLET(75 MG) BY MOUTH DAILY 90 tablet 3   LANTUS SOLOSTAR 100 UNIT/ML Solostar Pen Inject 50 Units into the skin at bedtime.     metFORMIN (GLUCOPHAGE-XR) 500 MG 24 hr tablet Take 500 mg by mouth 2 (two) times daily.     nitroGLYCERIN (NITROSTAT) 0.4 MG SL tablet Place 1 tablet (0.4 mg total) under the tongue every 5 (five) minutes as needed for chest pain. 90 tablet 3   NOVOLOG FLEXPEN 100 UNIT/ML FlexPen Inject 5-10 Units into the skin See admin instructions. Inject 5-10 units into the skin three times a day before meals, per sliding scale  0   pantoprazole (PROTONIX) 20 MG tablet Take 1 tablet (20 mg total) by mouth 2 (two)  times daily. (Patient not taking: Reported on 02/02/2021) 60 tablet 0   No current facility-administered medications on file prior to visit.    No Known Allergies   Assessment/Plan:  1. Hypertension -

## 2021-05-01 ENCOUNTER — Other Ambulatory Visit: Payer: Self-pay

## 2021-05-01 ENCOUNTER — Emergency Department (HOSPITAL_COMMUNITY)
Admission: EM | Admit: 2021-05-01 | Discharge: 2021-05-01 | Disposition: A | Discharge status: Home / Self Care | Payer: 59 | Attending: Emergency Medicine | Admitting: Emergency Medicine

## 2021-05-01 DIAGNOSIS — Z5321 Procedure and treatment not carried out due to patient leaving prior to being seen by health care provider: Secondary | ICD-10-CM | POA: Insufficient documentation

## 2021-05-01 DIAGNOSIS — R1030 Lower abdominal pain, unspecified: Secondary | ICD-10-CM | POA: Insufficient documentation

## 2021-05-01 LAB — CBC
HCT: 40.6 % (ref 39.0–52.0)
Hemoglobin: 13 g/dL (ref 13.0–17.0)
MCH: 28.1 pg (ref 26.0–34.0)
MCHC: 32 g/dL (ref 30.0–36.0)
MCV: 87.7 fL (ref 80.0–100.0)
Platelets: 209 10*3/uL (ref 150–400)
RBC: 4.63 MIL/uL (ref 4.22–5.81)
RDW: 13 % (ref 11.5–15.5)
WBC: 9.4 10*3/uL (ref 4.0–10.5)
nRBC: 0 % (ref 0.0–0.2)

## 2021-05-01 LAB — COMPREHENSIVE METABOLIC PANEL
ALT: 23 U/L (ref 0–44)
AST: 28 U/L (ref 15–41)
Albumin: 2.7 g/dL — ABNORMAL LOW (ref 3.5–5.0)
Alkaline Phosphatase: 109 U/L (ref 38–126)
Anion gap: 15 (ref 5–15)
BUN: 19 mg/dL (ref 6–20)
CO2: 21 mmol/L — ABNORMAL LOW (ref 22–32)
Calcium: 9.2 mg/dL (ref 8.9–10.3)
Chloride: 102 mmol/L (ref 98–111)
Creatinine, Ser: 2.84 mg/dL — ABNORMAL HIGH (ref 0.61–1.24)
GFR, Estimated: 28 mL/min — ABNORMAL LOW (ref >60)
Glucose, Bld: 111 mg/dL — ABNORMAL HIGH (ref 70–99)
Potassium: 4.3 mmol/L (ref 3.5–5.1)
Sodium: 138 mmol/L (ref 135–145)
Total Bilirubin: 1.4 mg/dL — ABNORMAL HIGH (ref 0.3–1.2)
Total Protein: 6.9 g/dL (ref 6.5–8.1)

## 2021-05-01 LAB — LIPASE, BLOOD: Lipase: 33 U/L (ref 11–51)

## 2021-05-01 NOTE — ED Notes (Signed)
Called pt for vitas no answer

## 2021-05-01 NOTE — ED Notes (Signed)
Pt called multiple times for vitals, no response.

## 2021-05-01 NOTE — ED Triage Notes (Signed)
Pt. Stated, [name] started having some lower stomach pain that goes to the back and Ive some taste of metal in my mouth for about a week.

## 2021-05-04 ENCOUNTER — Other Ambulatory Visit: Payer: Self-pay | Admitting: Home Modifications

## 2021-05-04 DIAGNOSIS — R319 Hematuria, unspecified: Secondary | ICD-10-CM

## 2021-05-04 DIAGNOSIS — R109 Unspecified abdominal pain: Secondary | ICD-10-CM

## 2021-05-04 DIAGNOSIS — N184 Chronic kidney disease, stage 4 (severe): Secondary | ICD-10-CM

## 2021-05-05 ENCOUNTER — Ambulatory Visit
Admission: RE | Admit: 2021-05-05 | Discharge: 2021-05-05 | Disposition: A | Discharge status: Home / Self Care | Payer: 59 | Source: Ambulatory Visit | Attending: Home Modifications | Admitting: Home Modifications

## 2021-05-05 ENCOUNTER — Other Ambulatory Visit: Payer: Self-pay | Admitting: Home Modifications

## 2021-05-05 DIAGNOSIS — N184 Chronic kidney disease, stage 4 (severe): Secondary | ICD-10-CM

## 2021-05-05 DIAGNOSIS — R319 Hematuria, unspecified: Secondary | ICD-10-CM

## 2021-05-05 DIAGNOSIS — R109 Unspecified abdominal pain: Secondary | ICD-10-CM

## 2021-05-06 ENCOUNTER — Other Ambulatory Visit: Payer: 59

## 2021-07-10 DIAGNOSIS — I5020 Unspecified systolic (congestive) heart failure: Secondary | ICD-10-CM | POA: Diagnosis not present

## 2021-07-10 DIAGNOSIS — I2510 Atherosclerotic heart disease of native coronary artery without angina pectoris: Secondary | ICD-10-CM | POA: Diagnosis not present

## 2021-07-10 DIAGNOSIS — N1832 Chronic kidney disease, stage 3b: Secondary | ICD-10-CM | POA: Diagnosis not present

## 2021-07-10 DIAGNOSIS — R319 Hematuria, unspecified: Secondary | ICD-10-CM | POA: Diagnosis not present

## 2021-07-10 DIAGNOSIS — E1122 Type 2 diabetes mellitus with diabetic chronic kidney disease: Secondary | ICD-10-CM | POA: Diagnosis not present

## 2021-07-10 DIAGNOSIS — I129 Hypertensive chronic kidney disease with stage 1 through stage 4 chronic kidney disease, or unspecified chronic kidney disease: Secondary | ICD-10-CM | POA: Diagnosis not present

## 2021-07-10 DIAGNOSIS — N179 Acute kidney failure, unspecified: Secondary | ICD-10-CM | POA: Diagnosis not present

## 2021-08-28 ENCOUNTER — Other Ambulatory Visit: Payer: Self-pay

## 2021-08-28 ENCOUNTER — Encounter (HOSPITAL_BASED_OUTPATIENT_CLINIC_OR_DEPARTMENT_OTHER): Payer: Self-pay

## 2021-08-28 ENCOUNTER — Observation Stay (HOSPITAL_BASED_OUTPATIENT_CLINIC_OR_DEPARTMENT_OTHER)
Admission: EM | Admit: 2021-08-28 | Discharge: 2021-08-29 | Disposition: A | Discharge status: Home / Self Care | Payer: 59 | Attending: Internal Medicine | Admitting: Internal Medicine

## 2021-08-28 ENCOUNTER — Emergency Department (HOSPITAL_BASED_OUTPATIENT_CLINIC_OR_DEPARTMENT_OTHER): Payer: 59

## 2021-08-28 DIAGNOSIS — N179 Acute kidney failure, unspecified: Secondary | ICD-10-CM | POA: Insufficient documentation

## 2021-08-28 DIAGNOSIS — I517 Cardiomegaly: Secondary | ICD-10-CM | POA: Diagnosis not present

## 2021-08-28 DIAGNOSIS — N183 Chronic kidney disease, stage 3 unspecified: Secondary | ICD-10-CM | POA: Diagnosis not present

## 2021-08-28 DIAGNOSIS — Z794 Long term (current) use of insulin: Secondary | ICD-10-CM | POA: Diagnosis not present

## 2021-08-28 DIAGNOSIS — E119 Type 2 diabetes mellitus without complications: Secondary | ICD-10-CM | POA: Diagnosis not present

## 2021-08-28 DIAGNOSIS — R079 Chest pain, unspecified: Secondary | ICD-10-CM | POA: Diagnosis not present

## 2021-08-28 DIAGNOSIS — N185 Chronic kidney disease, stage 5: Secondary | ICD-10-CM | POA: Diagnosis present

## 2021-08-28 DIAGNOSIS — Z79899 Other long term (current) drug therapy: Secondary | ICD-10-CM | POA: Diagnosis not present

## 2021-08-28 DIAGNOSIS — R519 Headache, unspecified: Secondary | ICD-10-CM | POA: Diagnosis not present

## 2021-08-28 DIAGNOSIS — Z7984 Long term (current) use of oral hypoglycemic drugs: Secondary | ICD-10-CM | POA: Diagnosis not present

## 2021-08-28 DIAGNOSIS — I2510 Atherosclerotic heart disease of native coronary artery without angina pectoris: Secondary | ICD-10-CM | POA: Diagnosis not present

## 2021-08-28 DIAGNOSIS — N1832 Chronic kidney disease, stage 3b: Secondary | ICD-10-CM | POA: Insufficient documentation

## 2021-08-28 DIAGNOSIS — I129 Hypertensive chronic kidney disease with stage 1 through stage 4 chronic kidney disease, or unspecified chronic kidney disease: Secondary | ICD-10-CM | POA: Diagnosis not present

## 2021-08-28 DIAGNOSIS — R0789 Other chest pain: Secondary | ICD-10-CM | POA: Diagnosis not present

## 2021-08-28 DIAGNOSIS — Z955 Presence of coronary angioplasty implant and graft: Secondary | ICD-10-CM | POA: Insufficient documentation

## 2021-08-28 DIAGNOSIS — Z7902 Long term (current) use of antithrombotics/antiplatelets: Secondary | ICD-10-CM | POA: Insufficient documentation

## 2021-08-28 DIAGNOSIS — I10 Essential (primary) hypertension: Secondary | ICD-10-CM | POA: Diagnosis not present

## 2021-08-28 DIAGNOSIS — Z7982 Long term (current) use of aspirin: Secondary | ICD-10-CM | POA: Diagnosis not present

## 2021-08-28 DIAGNOSIS — E785 Hyperlipidemia, unspecified: Secondary | ICD-10-CM | POA: Diagnosis not present

## 2021-08-28 DIAGNOSIS — Z9861 Coronary angioplasty status: Secondary | ICD-10-CM

## 2021-08-28 DIAGNOSIS — N184 Chronic kidney disease, stage 4 (severe): Secondary | ICD-10-CM | POA: Diagnosis present

## 2021-08-28 DIAGNOSIS — I25119 Atherosclerotic heart disease of native coronary artery with unspecified angina pectoris: Secondary | ICD-10-CM

## 2021-08-28 DIAGNOSIS — E1122 Type 2 diabetes mellitus with diabetic chronic kidney disease: Secondary | ICD-10-CM | POA: Diagnosis not present

## 2021-08-28 DIAGNOSIS — I160 Hypertensive urgency: Principal | ICD-10-CM | POA: Diagnosis present

## 2021-08-28 LAB — CBC WITH DIFFERENTIAL/PLATELET
Abs Immature Granulocytes: 0.02 10*3/uL (ref 0.00–0.07)
Basophils Absolute: 0 10*3/uL (ref 0.0–0.1)
Basophils Relative: 1 %
Eosinophils Absolute: 0.1 10*3/uL (ref 0.0–0.5)
Eosinophils Relative: 2 %
HCT: 34 % — ABNORMAL LOW (ref 39.0–52.0)
Hemoglobin: 11.4 g/dL — ABNORMAL LOW (ref 13.0–17.0)
Immature Granulocytes: 0 %
Lymphocytes Relative: 23 %
Lymphs Abs: 1.4 10*3/uL (ref 0.7–4.0)
MCH: 28.4 pg (ref 26.0–34.0)
MCHC: 33.5 g/dL (ref 30.0–36.0)
MCV: 84.6 fL (ref 80.0–100.0)
Monocytes Absolute: 0.4 10*3/uL (ref 0.1–1.0)
Monocytes Relative: 6 %
Neutro Abs: 4.2 10*3/uL (ref 1.7–7.7)
Neutrophils Relative %: 68 %
Platelets: 169 10*3/uL (ref 150–400)
RBC: 4.02 MIL/uL — ABNORMAL LOW (ref 4.22–5.81)
RDW: 12.8 % (ref 11.5–15.5)
WBC: 6.1 10*3/uL (ref 4.0–10.5)
nRBC: 0 % (ref 0.0–0.2)

## 2021-08-28 LAB — COMPREHENSIVE METABOLIC PANEL
ALT: 17 U/L (ref 0–44)
AST: 22 U/L (ref 15–41)
Albumin: 3.4 g/dL — ABNORMAL LOW (ref 3.5–5.0)
Alkaline Phosphatase: 72 U/L (ref 38–126)
Anion gap: 12 (ref 5–15)
BUN: 25 mg/dL — ABNORMAL HIGH (ref 6–20)
CO2: 20 mmol/L — ABNORMAL LOW (ref 22–32)
Calcium: 9.2 mg/dL (ref 8.9–10.3)
Chloride: 107 mmol/L (ref 98–111)
Creatinine, Ser: 2.48 mg/dL — ABNORMAL HIGH (ref 0.61–1.24)
GFR, Estimated: 33 mL/min — ABNORMAL LOW (ref >60)
Glucose, Bld: 131 mg/dL — ABNORMAL HIGH (ref 70–99)
Potassium: 3.5 mmol/L (ref 3.5–5.1)
Sodium: 139 mmol/L (ref 135–145)
Total Bilirubin: 1.3 mg/dL — ABNORMAL HIGH (ref 0.3–1.2)
Total Protein: 6.3 g/dL — ABNORMAL LOW (ref 6.5–8.1)

## 2021-08-28 LAB — TROPONIN I (HIGH SENSITIVITY)
Troponin I (High Sensitivity): 11 ng/L (ref <18)
Troponin I (High Sensitivity): 12 ng/L (ref <18)

## 2021-08-28 LAB — BRAIN NATRIURETIC PEPTIDE: B Natriuretic Peptide: 130.5 pg/mL — ABNORMAL HIGH (ref 0.0–100.0)

## 2021-08-28 LAB — GLUCOSE, CAPILLARY: Glucose-Capillary: 130 mg/dL — ABNORMAL HIGH (ref 70–99)

## 2021-08-28 MED ORDER — PROCHLORPERAZINE EDISYLATE 10 MG/2ML IJ SOLN
5.0000 mg | Freq: Four times a day (QID) | INTRAMUSCULAR | Status: DC | PRN
  Administered 2021-08-29: 5 mg via INTRAVENOUS
  Filled 2021-08-28: qty 2

## 2021-08-28 MED ORDER — NITROGLYCERIN 0.4 MG SL SUBL
0.4000 mg | SUBLINGUAL_TABLET | SUBLINGUAL | Status: DC | PRN
  Administered 2021-08-28 – 2021-08-29 (×3): 0.4 mg via SUBLINGUAL
  Filled 2021-08-28 (×2): qty 1

## 2021-08-28 MED ORDER — ASPIRIN EC 81 MG PO TBEC: 81.0000 mg | DELAYED_RELEASE_TABLET | Freq: Every day | ORAL | Status: DC

## 2021-08-28 MED ORDER — ACETAMINOPHEN 500 MG PO TABS
1000.0000 mg | ORAL_TABLET | Freq: Once | ORAL | Status: AC
Start: 2021-08-28 — End: 2021-08-28
  Administered 2021-08-28: 1000 mg via ORAL
  Filled 2021-08-28: qty 2

## 2021-08-28 MED ORDER — ACETAMINOPHEN 325 MG PO TABS
650.0000 mg | ORAL_TABLET | Freq: Four times a day (QID) | ORAL | Status: DC | PRN
  Administered 2021-08-28 – 2021-08-29 (×2): 650 mg via ORAL
  Filled 2021-08-28 (×2): qty 2

## 2021-08-28 MED ORDER — ALBUTEROL SULFATE (2.5 MG/3ML) 0.083% IN NEBU: 2.5000 mg | INHALATION_SOLUTION | Freq: Four times a day (QID) | RESPIRATORY_TRACT | Status: DC | PRN

## 2021-08-28 MED ORDER — HYDRALAZINE HCL 20 MG/ML IJ SOLN: 10.0000 mg | INTRAMUSCULAR | Status: DC | PRN

## 2021-08-28 MED ORDER — ISOSORB DINITRATE-HYDRALAZINE 20-37.5 MG PO TABS
1.0000 | ORAL_TABLET | Freq: Three times a day (TID) | ORAL | Status: DC
  Administered 2021-08-28 – 2021-08-29 (×3): 1 via ORAL
  Filled 2021-08-28 (×3): qty 1

## 2021-08-28 MED ORDER — CARVEDILOL 25 MG PO TABS
25.0000 mg | ORAL_TABLET | Freq: Two times a day (BID) | ORAL | Status: DC
  Administered 2021-08-28 – 2021-08-29 (×2): 25 mg via ORAL
  Filled 2021-08-28 (×2): qty 1

## 2021-08-28 MED ORDER — CLOPIDOGREL BISULFATE 75 MG PO TABS: 75.0000 mg | ORAL_TABLET | Freq: Every day | ORAL | Status: DC

## 2021-08-28 MED ORDER — PANTOPRAZOLE SODIUM 20 MG PO TBEC: 20.0000 mg | DELAYED_RELEASE_TABLET | Freq: Two times a day (BID) | ORAL | Status: DC

## 2021-08-28 MED ORDER — INSULIN ASPART 100 UNIT/ML IJ SOLN: 0.0000 [IU] | Freq: Three times a day (TID) | INTRAMUSCULAR | Status: DC

## 2021-08-28 MED ORDER — ACETAMINOPHEN 650 MG RE SUPP
650.0000 mg | Freq: Four times a day (QID) | RECTAL | Status: DC | PRN
Start: 2021-08-28 — End: 2021-08-30

## 2021-08-28 MED ORDER — ONDANSETRON HCL 4 MG/2ML IJ SOLN
INTRAMUSCULAR | Status: AC
Start: 2021-08-28 — End: 2021-08-29
  Filled 2021-08-28: qty 2

## 2021-08-28 MED ORDER — HYDRALAZINE HCL 20 MG/ML IJ SOLN: 20.0000 mg | INTRAMUSCULAR | Status: DC | PRN

## 2021-08-28 MED ORDER — MORPHINE SULFATE (PF) 2 MG/ML IV SOLN
1.0000 mg | Freq: Once | INTRAVENOUS | Status: AC | PRN
  Administered 2021-08-28: 1 mg via INTRAVENOUS
  Filled 2021-08-28: qty 1

## 2021-08-28 MED ORDER — HYDRALAZINE HCL 20 MG/ML IJ SOLN
10.0000 mg | INTRAMUSCULAR | Status: DC | PRN
  Administered 2021-08-28: 10 mg via INTRAVENOUS
  Filled 2021-08-28: qty 1

## 2021-08-28 MED ORDER — SODIUM CHLORIDE 0.9% FLUSH
3.0000 mL | Freq: Two times a day (BID) | INTRAVENOUS | Status: DC
  Administered 2021-08-28 – 2021-08-29 (×2): 3 mL via INTRAVENOUS

## 2021-08-28 MED ORDER — EMPAGLIFLOZIN 10 MG PO TABS
10.0000 mg | ORAL_TABLET | Freq: Every day | ORAL | Status: DC
  Administered 2021-08-29: 10 mg via ORAL
  Filled 2021-08-28 (×2): qty 1

## 2021-08-28 MED ORDER — PROCHLORPERAZINE EDISYLATE 10 MG/2ML IJ SOLN
5.0000 mg | Freq: Once | INTRAMUSCULAR | Status: DC | PRN
Start: 2021-08-28 — End: 2021-08-28

## 2021-08-28 MED ORDER — HYDRALAZINE HCL 25 MG PO TABS
25.0000 mg | ORAL_TABLET | Freq: Once | ORAL | Status: AC
  Administered 2021-08-28: 25 mg via ORAL
  Filled 2021-08-28: qty 1

## 2021-08-28 MED ORDER — ATORVASTATIN CALCIUM 80 MG PO TABS
80.0000 mg | ORAL_TABLET | Freq: Every day | ORAL | Status: DC
  Administered 2021-08-29: 80 mg via ORAL
  Filled 2021-08-28: qty 1

## 2021-08-28 MED ORDER — ONDANSETRON HCL 4 MG/2ML IJ SOLN
4.0000 mg | Freq: Four times a day (QID) | INTRAMUSCULAR | Status: DC | PRN
  Administered 2021-08-28: 4 mg via INTRAVENOUS
  Filled 2021-08-28: qty 2

## 2021-08-28 MED ORDER — ENOXAPARIN SODIUM 40 MG/0.4ML IJ SOSY
40.0000 mg | PREFILLED_SYRINGE | INTRAMUSCULAR | Status: DC
  Administered 2021-08-28: 40 mg via SUBCUTANEOUS
  Filled 2021-08-28: qty 0.4

## 2021-08-28 MED ORDER — INSULIN GLARGINE-YFGN 100 UNIT/ML ~~LOC~~ SOLN
20.0000 [IU] | Freq: Every day | SUBCUTANEOUS | Status: DC
  Administered 2021-08-28: 20 [IU] via SUBCUTANEOUS
  Filled 2021-08-28 (×3): qty 0.2

## 2021-08-28 MED ORDER — HYDRALAZINE HCL 20 MG/ML IJ SOLN: 5.0000 mg | INTRAMUSCULAR | Status: DC | PRN

## 2021-08-28 NOTE — Assessment & Plan Note (Addendum)
Last available hemoglobin A1c was 10.1 back in 2021.  Home diabetes medication regimen includes Lantus 15 units nightly and sliding scale insulin with meals. ?-Hypoglycemic protocol ?-Check hemoglobin A1c ?-Pharmacy substitution of Semglee reduced to 20 units nightly given limited p.o. intake ?-CBGs before every meal with moderate SSI ?-Adjust insulin regimen as needed ?

## 2021-08-28 NOTE — Assessment & Plan Note (Addendum)
Acute.  On admission patient was noted to have elevated blood pressure of 198/101 and complaints of headache.  Home medication regimen included Coreg 12.5 mg twice daily and losartan 50 mg daily.   ?-Admit to a telemetry bed ?-Held losartan due to kidney function. ?-Per cardiology increase Coreg to 25 mg twice daily and add on BiDil 20-37.5 mg 3 times daily ?-Hydralazine IV as needed for elevated blood pressure ?-Appreciate cardiology consultative services, we will follow-up for further recommendations ?

## 2021-08-28 NOTE — Plan of Care (Signed)
  Problem: Education: Goal: Knowledge of General Education information will improve Description: Including pain rating scale, medication(s)/side effects and non-pharmacologic comfort measures Outcome: Progressing   Problem: Health Behavior/Discharge Planning: Goal: Ability to manage health-related needs will improve Outcome: Progressing   Problem: Pain Managment: Goal: General experience of comfort will improve Outcome: Progressing   

## 2021-08-28 NOTE — Assessment & Plan Note (Signed)
Continue atorvastatin

## 2021-08-28 NOTE — H&P (Addendum)
?History and Physical  ? ? ?Patient: Bashar Milam [id number] DOB: 1985/11/12 ?DOA: 08/28/2021 ?DOS: the patient was seen and examined on 08/28/2021 ?PCP: [name], FNP  ?Patient coming from: [facility] ? ?Chief Complaint:  ?Chief Complaint  ?Patient presents with  ? Hypertension  ? ?HPI: Ayson Cherubini is a 36 y.o. male with medical history significant of hypertension, hyperlipidemia, MI in 2021,CAD s/p DES, ICM last EF 35 -40%, DM type II, CKD stage IIIb, and GERD presented with complaints of headache and elevated blood pressures.  He reports that typically his systolic blood pressures in the 140s.  This morning when the patient woke up he complained of a severe headache with tingling down his left arm and  pressure sensation of his upper abdomen.  He noted associated symptoms of some mild lower extremity swelling.  Denies having any significant fever, shortness of breath, cough, diaphoresis, nausea, vomiting, or recent sick contacts.  He has been taking losartan 50 mg daily and Coreg 12.5 mg twice daily as prescribed.  He is being followed by Dr. Zenda Alpers of nephrology in the outpatient setting.  He reports symptoms did not feel exactly like his previous heart attack.  He notices significant family history of heart disease in his father and grandfather at young ages.  Patient denies any tobacco, alcohol, or illicit drug use.  Records note patient was supposed to follow-up to have a stress test last year, but missed the follow-up. ? ?In the ED patient was found to have blood pressure elevated up to 190/97.  Labs noted hemoglobin 11.4, BUN 25, creatinine 2.48, total bilirubin 1.3, BNP 130.5, and high-sensitivity troponins 11 and 12.  Chest x-ray noted cardiomegaly without congestive failure or acute disease.  Patient had been given hydralazine 25 mg p.o., Tylenol 1000 mg p.o., and nitroglycerin sublingual. ? ?Review of Systems: As mentioned in the history of present illness. All other  systems reviewed and are negative. ?Past Medical History:  ?Diagnosis Date  ? Coronary artery disease   ? Diabetes mellitus without complication ([place])   ? Hypercholesteremia   ? Hypertension   ? Myocardial infarction [facility]) 01/2020  ? ?Past Surgical History:  ?Procedure Laterality Date  ? CORONARY STENT INTERVENTION N/A 01/28/2020  ? Procedure: CORONARY STENT INTERVENTION;  Surgeon: [name], MD;  Location: [place] [facility];  Service: Cardiovascular;  Laterality: N/A;  ? CORONARY/GRAFT ACUTE MI REVASCULARIZATION N/A 01/28/2020  ? Procedure: Coronary/Graft Acute MI Revascularization;  Surgeon: [name], MD;  Location: [place] [facility];  Service: Cardiovascular;  Laterality: N/A;  ? LEFT HEART CATH AND CORONARY ANGIOGRAPHY N/A 01/28/2020  ? Procedure: LEFT HEART CATH AND CORONARY ANGIOGRAPHY;  Surgeon: [name], MD;  Location: [place] [facility];  Service: Cardiovascular;  Laterality: N/A;  ? ?Social History:  reports that he has never smoked. He has never used smokeless tobacco. He reports that he does not drink alcohol and does not use drugs. ? ?No Known Allergies ? ?Family History  ?Problem Relation Age of Onset  ? Diabetes Father   ? ? ?Prior to Admission medications   ?Medication Sig Start Date End Date Taking? Authorizing Provider  ?aspirin EC 81 MG tablet Take 81 mg by mouth daily. Swallow whole.    [provider]  ?atorvastatin (LIPITOR) 80 MG tablet TAKE 1 TABLET(80 MG) BY MOUTH DAILY 01/20/21   [name], MD  ?carvedilol (COREG) 12.5 MG tablet Take 1 tablet (12.5 mg total) by mouth 2 (two) times  daily. 02/02/21   [name], MD  ?clopidogrel (PLAVIX) 75 MG tablet TAKE 1 TABLET(75 MG) BY MOUTH DAILY 01/20/21   [name], MD  ?LANTUS SOLOSTAR 100 UNIT/ML Solostar Pen Inject 50 Units into the skin at bedtime. 08/09/14   [provider]  ?metFORMIN (GLUCOPHAGE-XR) 500 MG 24 hr tablet Take 500 mg by mouth 2 (two) times daily. 02/21/20    [provider]  ?nitroGLYCERIN (NITROSTAT) 0.4 MG SL tablet Place 1 tablet (0.4 mg total) under the tongue every 5 (five) minutes as needed for chest pain. 02/02/21 05/03/21  [name], MD  ?NOVOLOG FLEXPEN 100 UNIT/ML FlexPen Inject 5-10 Units into the skin See admin instructions. Inject 5-10 units into the skin three times a day before meals, per sliding scale 02/24/18   [provider]  ?pantoprazole (PROTONIX) 20 MG tablet Take 1 tablet (20 mg total) by mouth 2 (two) times daily. ?Patient not taking: Reported on 02/02/2021 01/26/21   Lorelle Gibbs, DO  ? ? ?Physical Exam: ?Vitals:  ? 08/28/21 1100 08/28/21 1200 08/28/21 1230 08/28/21 1535  ?BP: (!) 168/91 (!) 154/87 (!) 162/97 (!) 188/103  ?Pulse: 71 70 65 72  ?Resp: 11 13 12 16   ?Temp:    98.2 ?F (36.8 ?C)  ?TempSrc:    Oral  ?SpO2: 97% 97% 99% 98%  ? ?Constitutional: Young male currently in no acute distress. ?Eyes: PERRL, lids and conjunctivae normal ?ENMT: Mucous membranes are moist.   ?Neck: normal, supple, no masses, no thyromegaly ?Respiratory: clear to auscultation bilaterally, no wheezing, no crackles. Normal respiratory effort. No accessory muscle use.  ?Cardiovascular: Regular rate and rhythm, no murmurs / rubs / gallops.  Trace lower extremity edema. 2+ pedal pulses. No carotid bruits.  ?Abdomen: no tenderness, no masses palpated.  ?Musculoskeletal: no clubbing / cyanosis. No joint deformity upper and lower extremities. Good ROM, no contractures. Normal muscle tone.  ?Skin: no rashes, lesions, ulcers.  ?Neurologic: CN 2-12 grossly intact. Strength 5/5 in all 4.  ?Psychiatric: Normal judgment and insight. Alert and oriented x 3. Normal mood.  ? ?Data Reviewed: ? ? ? ?Assessment and Plan: ?* Hypertensive urgency ?Acute.  On admission patient was noted to have elevated blood pressure of 198/101 and complaints of headache.  Home medication regimen included Coreg 12.5 mg twice daily and losartan 50 mg daily.   ?-Admit to a  telemetry bed ?-Held losartan due to kidney function. ?-Per cardiology increase Coreg to 25 mg twice daily and add on BiDil 20-37.5 mg 3 times daily ?-Hydralazine IV as needed for elevated blood pressure ?-Appreciate cardiology consultative services, we will follow-up for further recommendations ? ?CAD S/P percutaneous coronary angioplasty chest discomfort(acute) ?Patient reported complaints of epigastric pressure as well as tingling down his left arm.  However high-sensitivity troponins negative x2 and EKG similar to previous.  Suspect symptoms likely related with patient's uncontrolled blood pressures.  Cardiology evaluated and given patient's current kidney function would not be a candidate for cardiac cath at this time.  On ?-Continue ASA, Plavix, and statin ? ?CKD (chronic kidney disease) stage 3 b ?Patient creatinine 2.48.  He follows with [name] of [name] in the outpatient setting. ?-Follow-up with [name] in the outpatient setting.  Cardiology recommends consideration of the addition of [name]  ? ?Insulin-requiring or dependent type II diabetes mellitus ([place]) ?Last available hemoglobin A1c was 10.1 back in 2021.  Home diabetes medication regimen includes Lantus 15 units nightly and sliding scale insulin with meals. ?-  Hypoglycemic protocol ?-Check hemoglobin A1c ?-Pharmacy substitution of Semglee reduced to 20 units nightly given limited p.o. intake ?-CBGs before every meal with moderate SSI ?-Adjust insulin regimen as needed ? ?Dyslipidemia ?- Continue atorvastatin ? ? ? ? ? Advance Care Planning:   Code Status: Full ? ?Consults: Cardiology ? ?Family Communication: Significant other updated at bedside ? ?Severity of Illness: ?The appropriate patient status for this patient is OBSERVATION. Observation status is judged to be reasonable and necessary in order to provide the required intensity of service to ensure the patient's safety. The patient's presenting symptoms, physical  exam findings, and initial radiographic and laboratory data in the context of their medical condition is felt to place them at decreased risk for further clinical deterioration. Furthermore, it is anticipat

## 2021-08-28 NOTE — Assessment & Plan Note (Signed)
Patient creatinine 2.48.  He follows with [name] of [name] in the outpatient setting. ?-Follow-up with [name] in the outpatient setting.  Cardiology recommends consideration of the addition of [name]  ?

## 2021-08-28 NOTE — Plan of Care (Signed)
Transfer from [facility] to [facility].  See [facility] communication for signout. ?

## 2021-08-28 NOTE — Progress Notes (Signed)
Overnight event ? ?Patient admitted for hypertensive urgency and headaches.  Notified by RN that patient complaining of severe 10 out of 10 intensity headache and vomiting.  No abdominal pain.  He was given Zofran and Tylenol earlier but still symptomatic.  Current blood pressure is 159/96. ? ?-Stat head CT ordered ?-Hold aspirin, Plavix, Lovenox at this time, resume if head CT is negative for intracranial hemorrhage ?-IV morphine ordered for severe headache ?-Phenergan as needed for nausea/vomiting ?-Continue Coreg and BiDil ?-IV hydralazine prn SBP >160 ? ?

## 2021-08-28 NOTE — Consult Note (Addendum)
?Cardiology Consultation:  ? ?Patient ID: [name] ?MRN: [id number]; DOB: 11/19/1985 ? ?Admit date: 08/28/2021 ?Date of Consult: 08/28/2021 ? ?PCP:  [name], FNP ?  ?[facility] Providers ?Cardiologist:  Larae Grooms, MD   { ?Nephrologist: Dr. Hubert Azure of [name] ? ? ?Patient Profile:  ? ?[name] is a 36 y.o. male with a hx of HTN, HLD, IDDM, ICM, CAD and CKD stage III-IV who is being seen 08/28/2021 for the evaluation of chest discomfort in the setting of hypertensive urgency at the request of Dr. Tamala Julian. ? ?History of Present Illness:  ? ?[name] is a 36 year old male with past medical history of HTN, HLD, IDDM, ICM, CAD and CKD stage III-IV.  Patient has significant family history of heart disease and high blood pressure on his father side.  His father required LVAD.  He had anterior STEMI in August 2021.  Initial EKG showed anterior Q waves with marked ST elevation.  Cardiac catheterization on 01/28/2020 by Dr. Irish Lack revealed 100% proximal LAD occlusion treated with Synergy XD 2.5 x 24 mm DES postdilated to greater than 3 mm in diameter, EF 25 to 35% by visual estimate.  Subsequent echocardiogram obtained on 01/29/2020 showed EF 30 to 35% with regional wall motion abnormality, moderate LVH, normal RV.  Repeat echocardiogram on 04/01/2020 showed EF 35 to 40%, regional wall motion abnormality, mild LVH, grade 1 DD, trivial MR.  Patient was last seen by Dr. Irish Lack in August 2022 at which time he has gained 40 pounds in 3 months, his blood pressure was 180/110.  Carvedilol was increased to 12.5 mg twice a day.  There was plan of adding Entresto at a later time. He also had some chest discomfort at the time, therefore Myoview was ordered.  He was supposed to finish the Myoview and follow-up in 2 to 3 weeks for blood pressure medication titration, he has been lost to follow-up since.  Myoview was never done.  He went to the emergency room in November  2022 for abdominal discomfort.  Blood work at the time revealed his creatinine has jumped from the previous 1.7 up to 2.84.  CT of abdomen and pelvis showed no acute issue other than 5 cm subcutaneous fluid collection in the right groin area of uncertain significance, cannot exclude small subcutaneous abscess.  Unfortunately patient left the emergency room before he could be seen by a provider. ? ?According to patient, he has not had any further chest pain since he last saw Dr. Irish Lack.  He follows [name] of [facility].  He was seen by his nephrologist a month ago who is aware of the worsening renal function.  Patient also mentions other than carvedilol, he is also on losartan 50 mg at home which is not on our medication list.  He says [name] added Vania Rea to his medical regimen for renal protection.  Despite his compliance with 2 blood pressure medications, he says his usual blood pressure at home has been in the 140s.  Last night, he started having a severe headache along with some mild chest discomfort.  He says the discomfort in his chest is not a pain. He did have some left arm pain as well.  Symptom is different from his previous angina in 2021 at which time he had significant chest pain radiating to the back and also left arm.  On arrival, his blood pressure was in the 180s to the 190s range.  His last dose of  blood pressure medication was this morning.  Serial troponin negative x2.  BNP 130.5.  Creatinine 2.48.  Hemoglobin 11.4. ? ? ?Past Medical History:  ?Diagnosis Date  ? Coronary artery disease   ? Diabetes mellitus without complication ([place])   ? Hypercholesteremia   ? Hypertension   ? Myocardial infarction [facility]) 01/2020  ? ? ?Past Surgical History:  ?Procedure Laterality Date  ? CORONARY STENT INTERVENTION N/A 01/28/2020  ? Procedure: CORONARY STENT INTERVENTION;  Surgeon: [name], MD;  Location: [place] [facility];  Service: Cardiovascular;  Laterality: N/A;  ?  CORONARY/GRAFT ACUTE MI REVASCULARIZATION N/A 01/28/2020  ? Procedure: Coronary/Graft Acute MI Revascularization;  Surgeon: [name], MD;  Location: [place] [facility];  Service: Cardiovascular;  Laterality: N/A;  ? LEFT HEART CATH AND CORONARY ANGIOGRAPHY N/A 01/28/2020  ? Procedure: LEFT HEART CATH AND CORONARY ANGIOGRAPHY;  Surgeon: [name], MD;  Location: [place] [facility];  Service: Cardiovascular;  Laterality: N/A;  ?  ? ?Home Medications:  ?Prior to Admission medications   ?Medication Sig Start Date End Date Taking? Authorizing Provider  ?aspirin EC 81 MG tablet Take 81 mg by mouth daily. Swallow whole.    [provider]  ?atorvastatin (LIPITOR) 80 MG tablet TAKE 1 TABLET(80 MG) BY MOUTH DAILY ?Patient taking differently: Take 80 mg by mouth daily. 01/20/21   [name], MD  ?carvedilol (COREG) 12.5 MG tablet Take 1 tablet (12.5 mg total) by mouth 2 (two) times daily. 02/02/21   [name], MD  ?clopidogrel (PLAVIX) 75 MG tablet TAKE 1 TABLET(75 MG) BY MOUTH DAILY ?Patient taking differently: Take 75 mg by mouth daily. 01/20/21   [name], MD  ?LANTUS SOLOSTAR 100 UNIT/ML Solostar Pen Inject 50 Units into the skin at bedtime. 08/09/14   [provider]  ?metFORMIN (GLUCOPHAGE-XR) 500 MG 24 hr tablet Take 500 mg by mouth 2 (two) times daily. 02/21/20   [provider]  ?nitroGLYCERIN (NITROSTAT) 0.4 MG SL tablet Place 1 tablet (0.4 mg total) under the tongue every 5 (five) minutes as needed for chest pain. 02/02/21 05/03/21  [name], MD  ?NOVOLOG FLEXPEN 100 UNIT/ML FlexPen Inject 5-10 Units into the skin See admin instructions. Inject 5-10 units into the skin three times a day before meals, per sliding scale 02/24/18   [provider]  ?pantoprazole (PROTONIX) 20 MG tablet Take 1 tablet (20 mg total) by mouth 2 (two) times daily. ?Patient not taking: Reported on 02/02/2021 01/26/21   Horton, Alvin Critchley, DO   ? ? ?Inpatient Medications: ?Scheduled Meds: ? enoxaparin (LOVENOX) injection  40 mg Subcutaneous Q24H  ? sodium chloride flush  3 mL Intravenous Q12H  ? ?Continuous Infusions: ? ?PRN Meds: ?acetaminophen **OR** acetaminophen, albuterol, hydrALAZINE, nitroGLYCERIN ? ?Allergies:   No Known Allergies ? ?Social History:   ?Social History  ? ?Socioeconomic History  ? Marital status: Married  ?  Spouse name: Not on file  ? Number of children: Not on file  ? Years of education: Not on file  ? Highest education level: Not on file  ?Occupational History  ? Not on file  ?Tobacco Use  ? Smoking status: Never  ? Smokeless tobacco: Never  ?Vaping Use  ? Vaping Use: Never used  ?Substance and Sexual Activity  ? Alcohol use: No  ? Drug use: No  ? Sexual activity: Not on file  ?Other Topics Concern  ? Not on file  ?Social History Narrative  ? Not on  file  ? ?Social Determinants of Health  ? ?Financial Resource Strain: Not on file  ?Food Insecurity: Not on file  ?Transportation Needs: Not on file  ?Physical Activity: Not on file  ?Stress: Not on file  ?Social Connections: Not on file  ?Intimate Partner Violence: Not on file  ?  ?Family History:   ? ?Family History  ?Problem Relation Age of Onset  ? Diabetes Father   ?  ? ?ROS:  ?Please see the history of present illness.  ? ?All other ROS reviewed and negative.    ? ?Physical Exam/Data:  ? ?Vitals:  ? 08/28/21 1200 08/28/21 1230 08/28/21 1535 08/28/21 1630  ?BP: (!) 154/87 (!) 162/97 (!) 188/103 (!) 198/101  ?Pulse: 70 65 72 74  ?Resp: 13 12 16 13   ?Temp:   98.2 ?F (36.8 ?C)   ?TempSrc:   Oral   ?SpO2: 97% 99% 98% 99%  ? ?No intake or output data in the 24 hours ending 08/28/21 1717 ?Last 3 Weights 02/02/2021 12/22/2020 10/30/2020  ?Weight (lbs) 329 lb 6.4 oz 300 lb 289 lb 14.5 oz  ?Weight (kg) 149.415 kg 136.079 kg 131.5 kg  ?   ?There is no height or weight on file to calculate BMI.  ?General:  Well nourished, well developed, in no acute distress ?HEENT: normal ?Neck: no  JVD ?Vascular: No carotid bruits; Distal pulses 2+ bilaterally ?Cardiac:  normal S1, S2; RRR; no murmur  ?Lungs:  clear to auscultation bilaterally, no wheezing, rhonchi or rales  ?Abd: soft, nontender, no hepatomegaly  ?Ext

## 2021-08-28 NOTE — ED Notes (Signed)
Telephone report given to Ermalene Postin, RN  ?

## 2021-08-28 NOTE — Assessment & Plan Note (Addendum)
Patient reported complaints of epigastric pressure as well as tingling down his left arm.  However high-sensitivity troponins negative x2 and EKG similar to previous.  Suspect symptoms likely related with patient's uncontrolled blood pressures.  Cardiology evaluated and given patient's current kidney function would not be a candidate for cardiac cath at this time.  On ?-Continue ASA, Plavix, and statin ?

## 2021-08-28 NOTE — ED Provider Notes (Addendum)
?[facility] ?Provider Note ? ? ?CSN: [id number] ?Arrival date & time: 08/28/21  1194 ? ?  ? ?History ? ?Chief Complaint  ?Patient presents with  ? Hypertension  ? ? ?[name] is a 36 y.o. male. ? ? Pt with hx of obesity, DM, HTN, HLD, family history of CAD, 100% prox LAD (late presenting MI), s/p DES, EF 35-40% based on last echo in 2021 with anterior/septal akinesis most recently seen by cardiology in August 22and at that time was supposed to have a stress test but missed the appointment and had blood pressure modification who is presenting today with a complaint of elevated blood pressure this week, when he woke up this morning he had a severe headache and did not feel well.  He reports some mild discomfort in his chest versus his upper abdomen and in his left arm starting last night and is still mildly present this morning.  He denies any shortness of breath, fever, cough, nausea or vomiting.  No recent illness.  He has taken his carvedilol this morning and took his carvedilol and losartan last night.  He denies any recent dietary changes. ? ?The history is provided by the patient and medical records.  ?Hypertension ? ? ?  ? ?Home Medications ?Prior to Admission medications   ?Medication Sig Start Date End Date Taking? Authorizing Provider  ?aspirin EC 81 MG tablet Take 81 mg by mouth daily. Swallow whole.    [provider]  ?atorvastatin (LIPITOR) 80 MG tablet TAKE 1 TABLET(80 MG) BY MOUTH DAILY 01/20/21   [name], MD  ?carvedilol (COREG) 12.5 MG tablet Take 1 tablet (12.5 mg total) by mouth 2 (two) times daily. 02/02/21   [name], MD  ?clopidogrel (PLAVIX) 75 MG tablet TAKE 1 TABLET(75 MG) BY MOUTH DAILY 01/20/21   [name], MD  ?LANTUS SOLOSTAR 100 UNIT/ML Solostar Pen Inject 50 Units into the skin at bedtime. 08/09/14   [provider]  ?metFORMIN (GLUCOPHAGE-XR) 500 MG 24 hr tablet Take 500 mg by mouth 2 (two) times  daily. 02/21/20   [provider]  ?nitroGLYCERIN (NITROSTAT) 0.4 MG SL tablet Place 1 tablet (0.4 mg total) under the tongue every 5 (five) minutes as needed for chest pain. 02/02/21 05/03/21  [name], MD  ?NOVOLOG FLEXPEN 100 UNIT/ML FlexPen Inject 5-10 Units into the skin See admin instructions. Inject 5-10 units into the skin three times a day before meals, per sliding scale 02/24/18   [provider]  ?pantoprazole (PROTONIX) 20 MG tablet Take 1 tablet (20 mg total) by mouth 2 (two) times daily. ?Patient not taking: Reported on 02/02/2021 01/26/21   Horton, Alvin Critchley, DO  ?   ? ?Allergies    ?Patient has no known allergies.   ? ?Review of Systems   ?Review of Systems ? ?Physical Exam ?Updated Vital Signs ?BP (!) 186/105   Pulse 73   Temp 98.4 ?F (36.9 ?C)   Resp 13   SpO2 96%  ?Physical Exam ?Vitals and nursing note reviewed.  ?Constitutional:   ?   General: He is not in acute distress. ?   Appearance: He is well-developed.  ?HENT:  ?   Head: Normocephalic and atraumatic.  ?Eyes:  ?   Conjunctiva/sclera: Conjunctivae normal.  ?   Pupils: Pupils are equal, round, and reactive to light.  ?Cardiovascular:  ?   Rate and Rhythm: Normal rate and regular rhythm.  ?   Pulses: Normal pulses.  ?  Heart sounds: No murmur heard. ?Pulmonary:  ?   Effort: Pulmonary effort is normal. No respiratory distress.  ?   Breath sounds: Normal breath sounds. No wheezing or rales.  ?Abdominal:  ?   General: There is no distension.  ?   Palpations: Abdomen is soft.  ?   Tenderness: There is no abdominal tenderness. There is no guarding or rebound.  ?Musculoskeletal:     ?   General: No tenderness. Normal range of motion.  ?   Cervical back: Normal range of motion and neck supple.  ?Skin: ?   General: Skin is warm and dry.  ?   Findings: No erythema or rash.  ?Neurological:  ?   Mental Status: He is alert and oriented to person, place, and time.  ?Psychiatric:     ?   Behavior: Behavior normal.  ? ? ?ED  Results / Procedures / Treatments   ?Labs ?(all labs ordered are listed, but only abnormal results are displayed) ?Labs Reviewed  ?CBC WITH DIFFERENTIAL/PLATELET - Abnormal; Notable for the following components:  ?    Result Value  ? RBC 4.02 (*)   ? Hemoglobin 11.4 (*)   ? HCT 34.0 (*)   ? All other components within normal limits  ?COMPREHENSIVE METABOLIC PANEL - Abnormal; Notable for the following components:  ? CO2 20 (*)   ? Glucose, Bld 131 (*)   ? BUN 25 (*)   ? Creatinine, Ser 2.48 (*)   ? Total Protein 6.3 (*)   ? Albumin 3.4 (*)   ? Total Bilirubin 1.3 (*)   ? GFR, Estimated 33 (*)   ? All other components within normal limits  ?BRAIN NATRIURETIC PEPTIDE - Abnormal; Notable for the following components:  ? B Natriuretic Peptide 130.5 (*)   ? All other components within normal limits  ?TROPONIN I (HIGH SENSITIVITY)  ?TROPONIN I (HIGH SENSITIVITY)  ? ? ?EKG ?EKG Interpretation ? ?Date/Time:  Friday August 28 2021 08:13:37 EDT ?Ventricular Rate:  80 ?PR Interval:  170 ?QRS Duration: 102 ?QT Interval:  385 ?QTC Calculation: 445 ?R Axis:   -2 ?Text Interpretation: Sinus rhythm Probable left atrial enlargement Anterior infarct, old Baseline wander in lead(s) V1 No significant change since last tracing Confirmed by Blanchie Dessert [phone]) on 08/28/2021 8:25:15 AM ? ?Radiology ?DG Chest Port 1 View ? ?Result Date: 08/28/2021 ?CLINICAL DATA:  Chest pain and hypertension. EXAM: PORTABLE CHEST 1 VIEW COMPARISON:  01/26/2021 FINDINGS: Numerous leads and wires project over the chest. Midline trachea. Mild cardiomegaly. No pleural effusion or pneumothorax. No congestive failure. IMPRESSION: Cardiomegaly without congestive failure or acute disease. Electronically Signed   By: Abigail Miyamoto M.D.   On: 08/28/2021 08:56   ? ?Procedures ?Procedures  ? ? ?Medications Ordered in ED ?Medications  ?nitroGLYCERIN (NITROSTAT) SL tablet 0.4 mg (has no administration in time range)  ?hydrALAZINE (APRESOLINE) tablet 25 mg (has no  administration in time range)  ? ? ?ED Course/ Medical Decision Making/ A&P ?  ?                        ?Medical Decision Making ?Amount and/or Complexity of Data Reviewed ?External Data Reviewed: notes. ?Labs: ordered. Decision-making details documented in ED Course. ?Radiology: ordered and independent interpretation performed. Decision-making details documented in ED Course. ?ECG/medicine tests: ordered and independent interpretation performed. Decision-making details documented in ED Course. ? ?Risk ?OTC drugs. ?Prescription drug management. ?Drug therapy requiring intensive monitoring for toxicity. ? ? ?Patient presenting  with significant cardiac history with complaints of elevated blood pressure today with headache and some chest pressure.  Low suspicion for PE, infectious etiology, dissection.  Concern for ACS versus hypertensive urgency.  Patient did take his carvedilol prior to arrival and upon arrival here blood pressure is 144/76 and patient reports his headache is pretty much gone now and the discomfort in his chest is resolving.  I independently interpreted patient's EKG today and labs.  EKG without acute changes with chronic mild ST elevation anteriorly and chronic T wave inversion laterally.  CBC without acute findings, CMP with chronic CKD with creatinine of 2.4 which seems to be similar to 6 months ago.  Initial troponin is negative at 11 and BNP only slightly elevated at 130.  I independently visualized and interpreted patient's chest x-ray which shows cardiomegaly but no evidence of fluid overload patient's external medical records from his cardiology office were reviewed. ? ?10:24 AM ?On reevaluation of the patient his blood pressure has gone back up and is now between 180-190/105-110.  He reports he has still ongoing chest discomfort and a headache.  Concern for hypertensive urgency.  He reports that with the medications he is on it has never seem to really control his blood pressure but this is  much higher than typical.  Patient was given nitroglycerin and given a dose of hydralazine.  Feel that patient will need admission for hypertensive urgency.  Delta troponin is also pending.  These findings were discus

## 2021-08-28 NOTE — ED Triage Notes (Signed)
Patient states his BP was 179/100 this morning. Has history of HTN, states has been taking meds appropriately. States he has a discomfort in chest, down left arm.   ?

## 2021-08-29 ENCOUNTER — Observation Stay (HOSPITAL_COMMUNITY): Payer: 59

## 2021-08-29 DIAGNOSIS — I160 Hypertensive urgency: Secondary | ICD-10-CM | POA: Diagnosis not present

## 2021-08-29 DIAGNOSIS — R519 Headache, unspecified: Secondary | ICD-10-CM | POA: Diagnosis not present

## 2021-08-29 DIAGNOSIS — R0789 Other chest pain: Secondary | ICD-10-CM | POA: Diagnosis not present

## 2021-08-29 LAB — CBC
HCT: 35.3 % — ABNORMAL LOW (ref 39.0–52.0)
Hemoglobin: 12 g/dL — ABNORMAL LOW (ref 13.0–17.0)
MCH: 28.9 pg (ref 26.0–34.0)
MCHC: 34 g/dL (ref 30.0–36.0)
MCV: 85.1 fL (ref 80.0–100.0)
Platelets: 169 10*3/uL (ref 150–400)
RBC: 4.15 MIL/uL — ABNORMAL LOW (ref 4.22–5.81)
RDW: 12.9 % (ref 11.5–15.5)
WBC: 8.2 10*3/uL (ref 4.0–10.5)
nRBC: 0 % (ref 0.0–0.2)

## 2021-08-29 LAB — HEMOGLOBIN A1C
Hgb A1c MFr Bld: 6.5 % — ABNORMAL HIGH (ref 4.8–5.6)
Mean Plasma Glucose: 139.85 mg/dL

## 2021-08-29 LAB — BASIC METABOLIC PANEL
Anion gap: 12 (ref 5–15)
BUN: 25 mg/dL — ABNORMAL HIGH (ref 6–20)
CO2: 22 mmol/L (ref 22–32)
Calcium: 9 mg/dL (ref 8.9–10.3)
Chloride: 106 mmol/L (ref 98–111)
Creatinine, Ser: 2.62 mg/dL — ABNORMAL HIGH (ref 0.61–1.24)
GFR, Estimated: 31 mL/min — ABNORMAL LOW (ref >60)
Glucose, Bld: 148 mg/dL — ABNORMAL HIGH (ref 70–99)
Potassium: 3.5 mmol/L (ref 3.5–5.1)
Sodium: 140 mmol/L (ref 135–145)

## 2021-08-29 LAB — GLUCOSE, CAPILLARY
Glucose-Capillary: 110 mg/dL — ABNORMAL HIGH (ref 70–99)
Glucose-Capillary: 106 mg/dL — ABNORMAL HIGH (ref 70–99)

## 2021-08-29 LAB — HIV ANTIBODY (ROUTINE TESTING W REFLEX): HIV Screen 4th Generation wRfx: NONREACTIVE

## 2021-08-29 MED ORDER — ENOXAPARIN SODIUM 40 MG/0.4ML IJ SOSY: 40.0000 mg | PREFILLED_SYRINGE | INTRAMUSCULAR | Status: DC

## 2021-08-29 MED ORDER — ASPIRIN EC 81 MG PO TBEC
81.0000 mg | DELAYED_RELEASE_TABLET | Freq: Every day | ORAL | Status: DC
  Administered 2021-08-29: 81 mg via ORAL
  Filled 2021-08-29: qty 1

## 2021-08-29 MED ORDER — CARVEDILOL 25 MG PO TABS
25.0000 mg | ORAL_TABLET | Freq: Two times a day (BID) | ORAL | 0 refills | Status: DC
Start: 2021-08-29 — End: 2021-09-21

## 2021-08-29 MED ORDER — ISOSORB DINITRATE-HYDRALAZINE 20-37.5 MG PO TABS
1.0000 | ORAL_TABLET | Freq: Three times a day (TID) | ORAL | 0 refills | Status: AC
Start: 2021-08-29 — End: 2021-09-28

## 2021-08-29 MED ORDER — CLOPIDOGREL BISULFATE 75 MG PO TABS
75.0000 mg | ORAL_TABLET | Freq: Every day | ORAL | Status: DC
  Administered 2021-08-29: 75 mg via ORAL
  Filled 2021-08-29: qty 1

## 2021-08-29 NOTE — Discharge Summary (Signed)
Physician Discharge Summary  ?[name] UUV:[id number] DOB: 07-03-1985 DOA: 08/28/2021 ? ?PCP: [name], FNP ? ?Admit date: 08/28/2021 ?Discharge date: 08/29/2021 ? ?Admitted From: Home ?Disposition: Home ? ?Recommendations for Outpatient Follow-up:  ?Follow up with PCP in 1-2 weeks ?Please obtain BMP/CBC in one week ?Follow-up with your cardiology in 1 week ?Start Coreg 25 mg once daily and BiDil 3 times daily as per cardiology recommendations ?Follow-up with nephrology [name] as a scheduled next week ?Increase hydration and avoid use of NSAIDs ?Discussed with [name] regarding [name] (the medicine that helps protect your kidneys) ?Continue your all current medications ? ?Home Health: None ?Equipment/Devices: None ?Discharge Condition: Stable ?CODE STATUS: Full code ?Diet recommendation: Heart healthy diet ? ?Brief/Interim Summary: ? [name] is a 36 y.o. male with medical history significant of hypertension, hyperlipidemia, MI in 2021,CAD s/p DES, ICM last EF 35 -40%, DM type II, CKD stage IIIb, and GERD presented with complaints of headache and elevated blood pressures.   ?  ?In the ED patient was found to have blood pressure elevated up to 190/97.  Labs noted hemoglobin 11.4, BUN 25, creatinine 2.48, total bilirubin 1.3, BNP 130.5, and high-sensitivity troponins 11 and 12.  Chest x-ray noted cardiomegaly without congestive failure or acute disease.  Patient had been given hydralazine 25 mg p.o., Tylenol 1000 mg p.o., and nitroglycerin sublingual. ? ?Hypertensive urgency ?-On admission patient was noted to have elevated blood pressure of 198/101 and complaints of headache.  Home medication regimen included Coreg 12.5 mg twice daily and losartan 50 mg daily.   ?-Cardiology consulted: Coreg dose was increased from 12.5 mg to 25 mg twice daily.  Losartan was held due to AKI on CKD.  BiDil 3 times daily added. ? ?Headache: Likely secondary to uncontrolled hypertension ?-Stat CT head  was obtained last night which resulted negative for any acute findings.   ?-Resolved now. ? ?CAD S/P percutaneous coronary angioplasty chest discomfort(acute) ?-Patient reported complaints of epigastric pressure as well as tingling down his left arm.  However high-sensitivity troponins negative x2 and EKG similar to previous.  Suspect symptoms likely related with patient's uncontrolled blood pressures.  Cardiology evaluated and given patient's current kidney function would not be a candidate for cardiac cath at this time. ?-Continued ASA, Plavix, and statin ?-Increased Coreg dose and added BiDil per cardiology as above. ?-Discussed with on-call cardiology Dr. [name] to discharge from their standpoint. ?-Need  repeat transthoracic echo outpatient ?-Need to follow-up with cardiology outpatient ?  ?AKI on CKD (chronic kidney disease) stage 3 b ?-Patient creatinine 2.48.  He follows with [name] of [name] in the outpatient setting. ?-Cardiology recommends consideration of the addition of [place]ia  ?-Losartan held due to worsening renal function. ?-Has an appointment with [name] next week.  I encouraged him to keep the appointment and follow-up with [name].  Increase hydration and avoid use of NSAIDs.  He will also discuss with [name] regarding [name] ?  ?Insulin-dependent type II diabetes mellitus ([name]) ?-Well-controlled.  A1c 6.5%.  Home diabetes medication regimen includes Lantus 15 units nightly and sliding scale insulin with meals. ?-Resumed current home dose at the time of discharge.   ? ?Dyslipidemia ?- Continued atorvastatin ? ?Obesity with BMI of 36: ?-Diet modification, exercise and weight loss recommended ?  ?Discharge Diagnoses:  ?Hypertensive urgency ?Headache ?Coronary artery disease s/p PCI ?AKI on CKD stage IIIb ?Insulin-dependent type 2 diabetes mellitus ?Dyslipidemia ?Obesity with BMI of 36 ? ? ?Discharge  Instructions ? ?Discharge Instructions   ? ? Diet  - low sodium heart healthy   Complete by: As directed ?  ? Increase activity slowly   Complete by: As directed ?  ? ?  ? ?Allergies as of 08/29/2021   ?No Known Allergies ?  ? ?  ?Medication List  ?  ? ?TAKE these medications   ? ?aspirin EC 81 MG tablet ?Take 81 mg by mouth daily. Swallow whole. ?  ?atorvastatin 80 MG tablet ?Commonly known as: LIPITOR ?TAKE 1 TABLET(80 MG) BY MOUTH DAILY ?What changed: See the new instructions. ?  ?carvedilol 25 MG tablet ?Commonly known as: COREG ?Take 1 tablet (25 mg total) by mouth 2 (two) times daily with a meal. ?What changed:  ?medication strength ?how much to take ?when to take this ?  ?clopidogrel 75 MG tablet ?Commonly known as: PLAVIX ?TAKE 1 TABLET(75 MG) BY MOUTH DAILY ?What changed: See the new instructions. ?  ?isosorbide-hydrALAZINE 20-37.5 MG tablet ?Commonly known as: BIDIL ?Take 1 tablet by mouth 3 (three) times daily. ?  ?Jardiance 10 MG Tabs tablet ?Generic drug: empagliflozin ?Take 10 mg by mouth daily. ?  ?Lantus SoloStar 100 UNIT/ML Solostar Pen ?Generic drug: insulin glargine ?Inject 50 Units into the skin at bedtime. ?  ?nitroGLYCERIN 0.4 MG SL tablet ?Commonly known as: NITROSTAT ?Place 1 tablet (0.4 mg total) under the tongue every 5 (five) minutes as needed for chest pain. ?  ?NovoLOG FlexPen 100 UNIT/ML FlexPen ?Generic drug: insulin aspart ?Inject 5-10 Units into the skin See admin instructions. Inject 5-10 units into the skin three times a day before meals, per sliding scale ?  ?pantoprazole 20 MG tablet ?Commonly known as: PROTONIX ?Take 1 tablet (20 mg total) by mouth 2 (two) times daily. ?  ? ?  ? ? Follow-up Information   ? ? [name], FNP Follow up in 1 week(s).   ?Specialty: Family Medicine ?Contact information: ?[address] [place] ?[place] [address] ?[phone] ? ? ?  ?  ? ? [name], MD Follow up in 1 week(s).   ?Specialties: Cardiology, Radiology, Interventional Cardiology ?Contact information: ?[address] ?[address] ?[place] [address] ?[phone] ? ? ?  ?  ? ? [name], MD Follow up in 1 week(s).   ?Specialty: Internal Medicine ?Contact information: ?[address] ?[place] [address] ?[phone] ? ? ?  ?  ? ?  ?  ? ?  ? ?No Known Allergies ? ?Consultations: ?Cardiology ? ? ?Procedures/Studies: ?CT HEAD WO CONTRAST (5MM) ? ?Result Date: 08/29/2021 ?CLINICAL DATA:  Headache EXAM: CT HEAD WITHOUT CONTRAST TECHNIQUE: Contiguous axial images were obtained from the base of the skull through the vertex without intravenous contrast. RADIATION DOSE REDUCTION: This exam was performed according to the departmental dose-optimization program which includes automated exposure control, adjustment of the mA and/or kV according to patient size and/or use of iterative reconstruction technique. COMPARISON:  10/30/2020 FINDINGS: Brain: There is no mass, hemorrhage or extra-axial collection. The size and configuration of the ventricles and extra-axial CSF spaces are normal. The brain parenchyma is normal, without acute or chronic infarction. Vascular: No abnormal hyperdensity of the major intracranial arteries or dural venous sinuses. No intracranial atherosclerosis. Skull: The visualized skull base, calvarium and extracranial soft tissues are normal. Sinuses/Orbits: No fluid levels or advanced mucosal thickening of the visualized paranasal sinuses. No mastoid or middle ear effusion. The orbits are normal. IMPRESSION: Normal head CT. Electronically Signed   By: Lennette Bihari  [place] M.D.   On: 08/29/2021 02:57  ? ?DG Chest Port 1 View ? ?Result Date: 08/28/2021 ?CLINICAL DATA:  Chest pain and hypertension. EXAM: PORTABLE CHEST 1 VIEW COMPARISON:  01/26/2021 FINDINGS: Numerous leads and wires project over the chest. Midline trachea. Mild cardiomegaly. No pleural effusion or pneumothorax. No congestive failure. IMPRESSION: Cardiomegaly without congestive failure or acute disease. Electronically Signed   By: Abigail Miyamoto M.D.    On: 08/28/2021 08:56   ? ? ? ?Subjective: ?Patient seen and examined.  Resting comfortably on the bed.  Reports that his headache has improved and no further chest pain.  Wishes to go home.  Reports that he has a s

## 2021-08-29 NOTE — Progress Notes (Addendum)
? ?Progress Note ? ?Patient Name: [name] ?Date of Encounter: 08/29/2021 ? ?[facility] Cardiologist: Larae Grooms, MD  ? ?Subjective  ? ?No CP or dyspnea ? ?Inpatient Medications  ?  ?Scheduled Meds: ? aspirin EC  81 mg Oral Daily  ? atorvastatin  80 mg Oral Daily  ? carvedilol  25 mg Oral BID WC  ? clopidogrel  75 mg Oral Daily  ? empagliflozin  10 mg Oral Daily  ? enoxaparin (LOVENOX) injection  40 mg Subcutaneous Q24H  ? insulin aspart  0-15 Units Subcutaneous TID WC  ? insulin glargine-yfgn  20 Units Subcutaneous QHS  ? isosorbide-hydrALAZINE  1 tablet Oral TID  ? sodium chloride flush  3 mL Intravenous Q12H  ? ?Continuous Infusions: ? ?PRN Meds: ?acetaminophen **OR** acetaminophen, albuterol, hydrALAZINE, nitroGLYCERIN, prochlorperazine  ? ?Vital Signs  ?  ?Vitals:  ? 08/28/21 2300 08/28/21 2349 08/29/21 0542 08/29/21 0804  ?BP: (!) 161/91 (!) 159/96 136/86 (!) 147/73  ?Pulse: 84 91 87 84  ?Resp: 13 12 15 18   ?Temp: 98.4 ?F (36.9 ?C)  98.2 ?F (36.8 ?C) 98.1 ?F (36.7 ?C)  ?TempSrc: Oral  Oral Oral  ?SpO2: 97% 96% 98% 95%  ?Weight:      ?Height:      ? ? ?Intake/Output Summary (Last 24 hours) at 08/29/2021 0839 ?Last data filed at 08/29/2021 0010 ?Gross per 24 hour  ?Intake --  ?Output 400 ml  ?Net -400 ml  ? ?Last 3 Weights 08/28/2021 02/02/2021 12/22/2020  ?Weight (lbs) 300 lb 329 lb 6.4 oz 300 lb  ?Weight (kg) 136.079 kg 149.415 kg 136.079 kg  ?   ? ?Telemetry  ?  ?Sinus - Personally Reviewed ? ?Physical Exam  ? ?GEN: No acute distress.   ?Neck: No JVD ?Cardiac: RRR, no murmurs, rubs, or gallops.  ?Respiratory: Clear to auscultation bilaterally. ?GI: Soft, nontender, non-distended  ?MS: No edema ?Neuro:  Nonfocal  ?Psych: Normal affect  ? ?Labs  ?  ?High Sensitivity Troponin:   ?Recent Labs  ?Lab 08/28/21 ?0833 08/28/21 ?1019  ?TROPONINIHS 11 12  ?   ?Chemistry ?Recent Labs  ?Lab 08/28/21 ?0833 08/29/21 ?2482  ?NA 139 140  ?K 3.5 3.5  ?CL 107 106  ?CO2 20* 22  ?GLUCOSE 131* 148*  ?BUN 25* 25*   ?CREATININE 2.48* 2.62*  ?CALCIUM 9.2 9.0  ?PROT 6.3*  --   ?ALBUMIN 3.4*  --   ?AST 22  --   ?ALT 17  --   ?ALKPHOS 72  --   ?BILITOT 1.3*  --   ?GFRNONAA 33* 31*  ?ANIONGAP 12 12  ? ?Hematology ?Recent Labs  ?Lab 08/28/21 ?0833 08/29/21 ?5003  ?WBC 6.1 8.2  ?RBC 4.02* 4.15*  ?HGB 11.4* 12.0*  ?HCT 34.0* 35.3*  ?MCV 84.6 85.1  ?MCH 28.4 28.9  ?MCHC 33.5 34.0  ?RDW 12.8 12.9  ?PLT 169 169  ? ? ?BNP ?Recent Labs  ?Lab 08/28/21 ?0833  ?BNP 130.5*  ?  ? ? ?Radiology  ?  ?CT HEAD WO CONTRAST (5MM) ? ?Result Date: 08/29/2021 ?CLINICAL DATA:  Headache EXAM: CT HEAD WITHOUT CONTRAST TECHNIQUE: Contiguous axial images were obtained from the base of the skull through the vertex without intravenous contrast. RADIATION DOSE REDUCTION: This exam was performed according to the departmental dose-optimization program which includes automated exposure control, adjustment of the mA and/or kV according to patient size and/or use of iterative reconstruction technique. COMPARISON:  10/30/2020 FINDINGS: Brain: There is no mass, hemorrhage or extra-axial collection. The size and  configuration of the ventricles and extra-axial CSF spaces are normal. The brain parenchyma is normal, without acute or chronic infarction. Vascular: No abnormal hyperdensity of the major intracranial arteries or dural venous sinuses. No intracranial atherosclerosis. Skull: The visualized skull base, calvarium and extracranial soft tissues are normal. Sinuses/Orbits: No fluid levels or advanced mucosal thickening of the visualized paranasal sinuses. No mastoid or middle ear effusion. The orbits are normal. IMPRESSION: Normal head CT. Electronically Signed   By: Ulyses Jarred M.D.   On: 08/29/2021 02:57  ? ?DG Chest Port 1 View ? ?Result Date: 08/28/2021 ?CLINICAL DATA:  Chest pain and hypertension. EXAM: PORTABLE CHEST 1 VIEW COMPARISON:  01/26/2021 FINDINGS: Numerous leads and wires project over the chest. Midline trachea. Mild cardiomegaly. No pleural effusion  or pneumothorax. No congestive failure. IMPRESSION: Cardiomegaly without congestive failure or acute disease. Electronically Signed   By: Abigail Miyamoto M.D.   On: 08/28/2021 08:56   ? ?Patient Profile  ?   ?36 y.o. male with past medical history of hypertension, hyperlipidemia, diabetes mellitus, ischemic cardiomyopathy, coronary artery disease, chronic stage IV kidney disease for evaluation of chest pain and hypertensive urgency.  Most recent echocardiogram October 2021 showed ejection fraction 35 to 88%, grade 1 diastolic dysfunction. ? ?Assessment & Plan  ?  ?1 hypertensive urgency-blood pressure is improving.  We will continue carvedilol at 25 mg twice daily and BiDil.  ARB has been discontinued in the setting of acute renal insufficiency.  We will follow blood pressure and advance BiDil as needed.  Given history of ischemic cardiomyopathy would plan to repeat echocardiogram once medications titrated as an outpatient to reassess LV function. ? ?2 coronary artery disease-plan to continue aspirin, Plavix and statin. ? ?3 history of chest pain-troponins are normal and no symptoms today.  Patient can follow-up with Dr. Irish Lack following discharge and decision can be made in about repeating nuclear study. ? ?4 hyperlipidemia-continue statin. ? ?5 acute on chronic stage IV kidney disease-further evaluation per primary care. ? ?For questions or updates, please contact [facility] ?Please consult [email or web address] for contact info under  ? ?  ?   ?Signed, ?Kirk Ruths, MD  ?08/29/2021, 8:39 AM   ? ?

## 2021-08-29 NOTE — Plan of Care (Signed)
  Problem: Education: Goal: Knowledge of General Education information will improve Description Including pain rating scale, medication(s)/side effects and non-pharmacologic comfort measures Outcome: Progressing   

## 2021-08-30 ENCOUNTER — Other Ambulatory Visit: Payer: Self-pay | Admitting: Interventional Cardiology

## 2021-09-01 DIAGNOSIS — R809 Proteinuria, unspecified: Secondary | ICD-10-CM | POA: Diagnosis not present

## 2021-09-01 DIAGNOSIS — N1832 Chronic kidney disease, stage 3b: Secondary | ICD-10-CM | POA: Diagnosis not present

## 2021-09-01 DIAGNOSIS — R319 Hematuria, unspecified: Secondary | ICD-10-CM | POA: Diagnosis not present

## 2021-09-01 DIAGNOSIS — I5020 Unspecified systolic (congestive) heart failure: Secondary | ICD-10-CM | POA: Diagnosis not present

## 2021-09-01 DIAGNOSIS — N179 Acute kidney failure, unspecified: Secondary | ICD-10-CM | POA: Diagnosis not present

## 2021-09-01 DIAGNOSIS — I129 Hypertensive chronic kidney disease with stage 1 through stage 4 chronic kidney disease, or unspecified chronic kidney disease: Secondary | ICD-10-CM | POA: Diagnosis not present

## 2021-09-01 DIAGNOSIS — I2510 Atherosclerotic heart disease of native coronary artery without angina pectoris: Secondary | ICD-10-CM | POA: Diagnosis not present

## 2021-09-07 NOTE — Progress Notes (Deleted)
? ? ?Office Visit  ?  ?Patient Name: [name] ?Date of Encounter: 09/07/2021 ? ?Primary Care Provider:  [name], FNP ?Primary Cardiologist:  Greg Grooms, MD ?Primary Electrophysiologist: None ?Chief Complaint  ?  ?Post hospital follow-up for hypertensive urgency ? ? Patient Profile: ?CAD > MI 2021 ?IDDM ?HTN ?Obesity ?AKI ?HLD ? ? Recent Studies: ?2D echo 01/29/20: EF 30-35% anterior/septal akinesis, moderate LVH ?2D echo 01/30/20: EF 45-40%, septal and inferior apical hypokinesis, dilated LA ?2D Echo 03/2020:  ejection fraction 35 to 40%, grade 1 diastolic dysfunction. ?LHC 01/28/20: Prox LAD lesion is 100% stenosed. DESx1 ? ? ?History of Present Illness  ?  ?[name] is a 36 y.o. male who presents today for post hospital follow-up with PMH of obesity, IDDM, HTN, HLD, Anterior STEMI 01/2020 CAD with Cath s/p DES to 100% occluded proximal LAD, 0% residual stenosis.  Echo completed 10/21 with EF 35-40%, severe hypokinesis, decreased LV function, mild LVH, grade 1 DD, trivial MR.  He presented to the ED again on 05/2020 with complaint of CP with acute ischemic changes, and normal troponin.  However patient left AMA at that time.  He presented January 2022 to the ER with similar complaint of chest pain.  Cardiovascular work-up was negative however he was COVID-positive.  He was seen in ER on 01/2021 with complaint of chest pain and shortness of breath.  Troponins were negative x2 with no signs of ACS.   ? ?He was last seen in office by [name] on 8/22.  During visit his Coreg was increased to 12.5 mg twice daily and nuclear stress test was ordered to evaluate recent chest pain.  There was also consideration of adding Entresto low-dose to his regimen.  However [name] did not complete his Myoview at that time.  He presented to the emergency room on 08/28/21 with hypertensive urgency and severe headache.  He has some mild chest discomfort on arrival blood pressure was in the 180s-190s,  with creatinine of 2.48 and hemoglobin 11.4.  Cath not performed and only reserved for high risk clinical scenario setting of hypertensive urgency or escalated elevated troponin with ECG changes.his ARB was discontinued, and BILDIL was added due to blood pressure and low EF.  Nuclear stress also considered not ideal due to risk of false positive in setting of old anterior infarct and body habitus. ? ? ?Since {Blank single:19197::"last being seen in our clinic","discharge from hospital"} the patient reports doing ***.  @He /She@ denies chest pain, palpitations, dyspnea, PND, orthopnea, nausea, vomiting, dizziness, syncope, edema, weight gain, or early satiety. ? ?Note: ?Nuclear Stress ?Chest Pain ?Coreg 25 mg once daily, Bildil 3 times daily ?S.E. are headache dizziness or hypotension ?-Nephrology F/U and  [name] discussed? ?Check BMET today, Repeat 2D echo today? ?Past Medical History  ?  ?Past Medical History:  ?Diagnosis Date  ? Coronary artery disease   ? Diabetes mellitus without complication ([place])   ? Hypercholesteremia   ? Hypertension   ? Myocardial infarction [facility]) 01/2020  ? ?Past Surgical History:  ?Procedure Laterality Date  ? CORONARY STENT INTERVENTION N/A 01/28/2020  ? Procedure: CORONARY STENT INTERVENTION;  Surgeon: Greg Booze, MD;  Location: [place] [facility];  Service: Cardiovascular;  Laterality: N/A;  ? CORONARY/GRAFT ACUTE MI REVASCULARIZATION N/A 01/28/2020  ? Procedure: Coronary/Graft Acute MI Revascularization;  Surgeon: Greg Booze, MD;  Location: [place] [facility];  Service: Cardiovascular;  Laterality: N/A;  ? LEFT HEART CATH AND CORONARY ANGIOGRAPHY N/A 01/28/2020  ?  Procedure: LEFT HEART CATH AND CORONARY ANGIOGRAPHY;  Surgeon: Greg Booze, MD;  Location: [place] [facility];  Service: Cardiovascular;  Laterality: N/A;  ? ? ?Allergies ? ?No Known Allergies ? ?Home Medications  ?  ?Current Outpatient Medications  ?Medication Sig Dispense Refill  ? aspirin EC 81  MG tablet Take 81 mg by mouth daily. Swallow whole.    ? atorvastatin (LIPITOR) 80 MG tablet TAKE 1 TABLET(80 MG) BY MOUTH DAILY (Patient taking differently: Take 80 mg by mouth daily.) 90 tablet 3  ? carvedilol (COREG) 25 MG tablet Take 1 tablet (25 mg total) by mouth 2 (two) times daily with a meal. 60 tablet 0  ? clopidogrel (PLAVIX) 75 MG tablet TAKE 1 TABLET(75 MG) BY MOUTH DAILY (Patient taking differently: Take 75 mg by mouth daily.) 90 tablet 3  ? isosorbide-hydrALAZINE (BIDIL) 20-37.5 MG tablet Take 1 tablet by mouth 3 (three) times daily. 90 tablet 0  ? JARDIANCE 10 MG TABS tablet Take 10 mg by mouth daily.    ? LANTUS SOLOSTAR 100 UNIT/ML Solostar Pen Inject 50 Units into the skin at bedtime.    ? nitroGLYCERIN (NITROSTAT) 0.4 MG SL tablet Place 1 tablet (0.4 mg total) under the tongue every 5 (five) minutes as needed for chest pain. 90 tablet 3  ? NOVOLOG FLEXPEN 100 UNIT/ML FlexPen Inject 5-10 Units into the skin See admin instructions. Inject 5-10 units into the skin three times a day before meals, per sliding scale  0  ? pantoprazole (PROTONIX) 20 MG tablet Take 1 tablet (20 mg total) by mouth 2 (two) times daily. (Patient not taking: Reported on 02/02/2021) 60 tablet 0  ? ?No current facility-administered medications for this visit.  ?  ? ?Review of Systems  ?Please see the history of present illness.    ?(+)*** ?(+)*** ? ?All other systems reviewed and are otherwise negative except as noted above. ? ?Physical Exam  ?  ?Wt Readings from Last 3 Encounters:  ?08/28/21 300 lb (136.1 kg)  ?02/02/21 (!) 329 lb 6.4 oz (149.4 kg)  ?12/22/20 300 lb (136.1 kg)  ? ?FY:BOFBP were no vitals filed for this visit.,There is no height or weight on file to calculate BMI. ? ?Constitutional:   ?   Appearance: Healthy appearance. Not in distress.  ?Neck:  ?   Vascular: JVD normal.  ?Pulmonary:  ?   Effort: Pulmonary effort is normal.  ?   Breath sounds: No wheezing. No rales.  ?Cardiovascular:  ?   Normal rate. Regular  rhythm. Normal S1. Normal S2.   ?   Murmurs: There is no murmur.  ?Edema: ?   Peripheral edema absent.  ?Abdominal:  ?   Palpations: Abdomen is soft. There is no hepatomegaly.  ?Skin: ?   General: Skin is warm and dry.  ?Neurological:  ?   General: No focal deficit present.  ?   Mental Status: Alert and oriented to person, place and time.  ?   Cranial Nerves: Cranial nerves are intact.  ?EKG/LABS/Other Studies Reviewed  ?  ?ECG personally reviewed by me today - ***  with rate of ***- no acute changes. ? ?Risk Assessment/Calculations:   ?{Does this patient have ATRIAL FIBRILLATION?:[phone]} ? ?    ? ? ?Lab Results  ?Component Value Date  ? WBC 8.2 08/29/2021  ? HGB 12.0 (L) 08/29/2021  ? HCT 35.3 (L) 08/29/2021  ? MCV 85.1 08/29/2021  ? PLT 169 08/29/2021  ? ?Lab Results  ?Component Value Date  ? CREATININE  2.62 (H) 08/29/2021  ? BUN 25 (H) 08/29/2021  ? NA 140 08/29/2021  ? K 3.5 08/29/2021  ? CL 106 08/29/2021  ? CO2 22 08/29/2021  ? ?Lab Results  ?Component Value Date  ? ALT 17 08/28/2021  ? AST 22 08/28/2021  ? ALKPHOS 72 08/28/2021  ? BILITOT 1.3 (H) 08/28/2021  ? ?Lab Results  ?Component Value Date  ? CHOL 131 04/01/2020  ? HDL 24 (L) 04/01/2020  ? [place] 67 04/01/2020  ? LDLDIRECT 118.2 (H) 01/28/2020  ? TRIG 242 (H) 04/01/2020  ? CHOLHDL 5.5 (H) 04/01/2020  ?  ?Lab Results  ?Component Value Date  ? HGBA1C 6.5 (H) 08/29/2021  ? ? ?Assessment & Plan  ?  ?1.  CAD/chest pain: ?-s/p Prox LAD lesion is 100% stenosed. DESx1 ?-Due to worsening renal status not a candidate for cardiac catheterization at this time. ?-Continue GDMT, Coreg 25 mg, atorvastatin 80 mg, ASA 81 mg ?-Today patient has symptoms*** ? ? ?2.  HFrEF: ?-[place] class II heart failure ?-2D echo 01/30/20: EF 45-40%, septal and inferior apical hypokinesis, dilated LA ?-Continue GDMT Coreg 25 mg twice daily, Jardiance 10 mg daily,BILDIL 20.37.5 mg 3 times a day. ?-Losartan was held due to AKI ? ?3.  Essential hypertension: ?-Patient was admitted for  hypertensive urgency with complaint of headache.  BPs up to 190/97. ?-Continue BiDIL 20-37.5 mg 3 times daily ?Continue carvedilol 25 mg QD ? ?4.  Hyperlipidemia: ?-Last LDL 03/2020>67 ?-Continue Lipi

## 2021-09-08 ENCOUNTER — Ambulatory Visit: Payer: 59 | Admitting: Nurse Practitioner

## 2021-09-08 DIAGNOSIS — E669 Obesity, unspecified: Secondary | ICD-10-CM

## 2021-09-08 DIAGNOSIS — I25118 Atherosclerotic heart disease of native coronary artery with other forms of angina pectoris: Secondary | ICD-10-CM

## 2021-09-08 DIAGNOSIS — I10 Essential (primary) hypertension: Secondary | ICD-10-CM

## 2021-09-08 DIAGNOSIS — E785 Hyperlipidemia, unspecified: Secondary | ICD-10-CM

## 2021-09-08 DIAGNOSIS — N1832 Chronic kidney disease, stage 3b: Secondary | ICD-10-CM

## 2021-09-10 ENCOUNTER — Ambulatory Visit: Payer: 59 | Admitting: Physician Assistant

## 2021-09-10 NOTE — Progress Notes (Deleted)
?Cardiology Office Note:   ? ?Date:  09/10/2021  ? ?ID:  [name], DOB 02/25/1986, MRN [id number] ? ?PCP:  [name], FNP  ?[facility] Cardiologist:  Larae Grooms, MD  ?[place] Electrophysiologist:  None  ? ?Chief Complaint: hospital follow up  ? ?History of Present Illness:   ? ?[name] is a 36 y.o. male with a hx of HTN, HLD, IDDM, ICM, CAD s/p DES to pLAD and CKD stage III-IV seen for hospital follow up.  ? ?Patient has significant family history of heart disease and high blood pressure on his father side.  His father required LVAD.  He had anterior STEMI in August 2021.  Initial EKG showed anterior Q waves with marked ST elevation.  Cardiac catheterization on 01/28/2020 by Dr. Irish Lack revealed 100% proximal LAD occlusion treated with Synergy XD 2.5 x 24 mm DES postdilated to greater than 3 mm in diameter, EF 25 to 35% by visual estimate.  Subsequent echocardiogram obtained on 01/29/2020 showed EF 30 to 35% with regional wall motion abnormality, moderate LVH, normal RV.  Repeat echocardiogram on 04/01/2020 showed EF 35 to 40%, regional wall motion abnormality, mild LVH, grade 1 DD, trivial MR.  ? ?Patient was last seen by Dr. Irish Lack in August 2022 at which time he has gained 40 pounds in 3 months, his blood pressure was 180/110.  Carvedilol was increased to 12.5 mg twice a day.  There was plan of adding Entresto at a later time. He also had some chest discomfort at the time, therefore Myoview was ordered.  He was supposed to finish the Myoview and follow-up in 2 to 3 weeks for blood pressure medication titration, he has been lost to follow-up since.  Myoview was never done.  He went to the emergency room in November 2022 for abdominal discomfort.  Blood work at the time revealed his creatinine has jumped from the previous 1.7 up to 2.84.  CT of abdomen and pelvis showed no acute issue other than 5 cm subcutaneous fluid collection in the right groin area of uncertain  significance, cannot exclude small subcutaneous abscess.  Unfortunately patient left the emergency room before he could be seen by a provider. ? ?Admitted 08/2021 for hypertensive urgency and weak chest discomfort.  Troponin was negative.  ARB discontinued in setting of acute renal insufficiency.  Added BiDil.  Recommended outpatient follow-up. ? ?Seen by [facility] at 09/01/2021.  Recommended not to stop ARB (restarted Losartan 50mg  qd) and continue Jardiance. Scr was 2.98.  Plan was to start chlorthalidone.  Eer Dr. Mauri Brooklyn' note " I feel very comfortable managing this patient's hypertension myself.  I do not feel cardiology is necessary for his hypertensive management".   ? ?Will scan office visit note in system. ? ?Here today for follow-up. ? ?Past Medical History:  ?Diagnosis Date  ? Coronary artery disease   ? Diabetes mellitus without complication ([place])   ? Hypercholesteremia   ? Hypertension   ? Myocardial infarction [facility]) 01/2020  ? ? ?Past Surgical History:  ?Procedure Laterality Date  ? CORONARY STENT INTERVENTION N/A 01/28/2020  ? Procedure: CORONARY STENT INTERVENTION;  Surgeon: [name], MD;  Location: [place] [facility];  Service: Cardiovascular;  Laterality: N/A;  ? CORONARY/GRAFT ACUTE MI REVASCULARIZATION N/A 01/28/2020  ? Procedure: Coronary/Graft Acute MI Revascularization;  Surgeon: [name], MD;  Location: [place] [facility];  Service: Cardiovascular;  Laterality: N/A;  ? LEFT HEART CATH AND CORONARY ANGIOGRAPHY N/A 01/28/2020  ?  Procedure: LEFT HEART CATH AND CORONARY ANGIOGRAPHY;  Surgeon: [name], MD;  Location: [place] [facility];  Service: Cardiovascular;  Laterality: N/A;  ? ? ?Current Medications: ?No outpatient medications have been marked as taking for the 09/10/21 encounter (Appointment) with Leanor Kail, [place].  ?  ? ?Allergies:   Patient has no known allergies.  ? ?Social History  ? ?Socioeconomic History  ? Marital status: Married  ?  Spouse  name: Not on file  ? Number of children: Not on file  ? Years of education: Not on file  ? Highest education level: Not on file  ?Occupational History  ? Not on file  ?Tobacco Use  ? Smoking status: Never  ? Smokeless tobacco: Never  ?Vaping Use  ? Vaping Use: Never used  ?Substance and Sexual Activity  ? Alcohol use: No  ? Drug use: No  ? Sexual activity: Not on file  ?Other Topics Concern  ? Not on file  ?Social History Narrative  ? Not on file  ? ?Social Determinants of Health  ? ?Financial Resource Strain: Not on file  ?Food Insecurity: Not on file  ?Transportation Needs: Not on file  ?Physical Activity: Not on file  ?Stress: Not on file  ?Social Connections: Not on file  ?  ? ?Family History: ?The patient's family history includes Diabetes in his father; Heart disease in his father.  *** ? ?ROS:   ?Please see the history of present illness.    ?All other systems reviewed and are negative. *** ? ?EKGs/Labs/Other Studies Reviewed:   ? ?The following studies were reviewed today: ? ?Echo 03/2020 ? 1. Apical akinesis. Severe hypokinesis of the inferoseptum and apical  ?anteroseptum. Left ventricular ejection fraction, by estimation, is 35 to  ?40%. The left ventricle has moderately decreased function. The left  ?ventricle demonstrates regional wall  ?motion abnormalities (see scoring diagram/findings for description). There  ?is mild left ventricular hypertrophy of the posterior segment. Left  ?ventricular diastolic parameters are consistent with Grade I diastolic  ?dysfunction (impaired relaxation).  ? 2. Right ventricular systolic function is normal. The right ventricular  ?size is normal.  ? 3. The mitral valve is normal in structure. Trivial mitral valve  ?regurgitation. No evidence of mitral stenosis.  ? 4. The aortic valve is tricuspid. Aortic valve regurgitation is not  ?visualized. No aortic stenosis is present.  ? 5. The inferior vena cava is normal in size with greater than 50%  ?respiratory variability,  suggesting right atrial pressure of 3 mmHg.  ? ?Comparison(s): 01/30/20 EF 40-45%.  ? ?Coronary/Graft Acute MI Revascularization  01/2020  ?CORONARY STENT INTERVENTION  ?LEFT HEART CATH AND CORONARY ANGIOGRAPHY  ? ?Conclusion ? ?  ?Prox LAD lesion is 100% stenosed. Late presenting anterior MI, as his symptoms have been going on for about 24 hours. ?A drug-eluting stent was successfully placed using a SYNERGY XD [id number], postdilated to greater than 3 mm in diameter. ?Post intervention, there is a 0% residual stenosis. ?There is moderate to severe left ventricular systolic dysfunction with anterior wall hypokinesis. ?LV end diastolic pressure is mildly elevated. ?The left ventricular ejection fraction is 25-35% by visual estimate. ?There is no aortic valve stenosis. ?  ?Watch for 48 hours.  Check echocardiogram.  May need to consider LifeVest. ?  ?Holding lisinopril at this time since his creatinine was elevated in the Cath Lab at the time of procedure. ?  ?1 year of dual antiplatelet therapy recommended along with aggressive secondary prevention. ? ?EKG:  EKG is *** ordered today.  The ekg ordered today demonstrates *** ? ?Recent Labs: ?08/28/2021: ALT 17; B Natriuretic Peptide 130.5 ?08/29/2021: BUN 25; Creatinine, Ser 2.62; Hemoglobin 12.0; Platelets 169; Potassium 3.5; Sodium 140  ?Recent Lipid Panel ?   ?Component Value Date/Time  ? CHOL 131 04/01/2020 0956  ? TRIG 242 (H) 04/01/2020 0956  ? HDL 24 (L) 04/01/2020 0956  ? CHOLHDL 5.5 (H) 04/01/2020 2761  ? CHOLHDL 6.4 01/29/2020 0731  ? VLDL 69 (H) 01/29/2020 0731  ? [place] 67 04/01/2020 0956  ? LDLDIRECT 118.2 (H) 01/28/2020 1242  ? ? ? ?Risk Assessment/Calculations:   ?{Does this patient have ATRIAL FIBRILLATION?[phone]} ? ? ?Physical Exam:   ? ?VS:  There were no vitals taken for this visit.   ? ?Wt Readings from Last 3 Encounters:  ?08/28/21 300 lb (136.1 kg)  ?02/02/21 (!) 329 lb 6.4 oz (149.4 kg)  ?12/22/20 300 lb (136.1 kg)  ?  ? ?GEN: *** Well nourished,  well developed in no acute distress ?HEENT: Normal ?NECK: No JVD; No carotid bruits ?LYMPHATICS: No lymphadenopathy ?CARDIAC: ***RRR, no murmurs, rubs, gallops ?RESPIRATORY:  Clear to auscultation without r

## 2021-09-21 ENCOUNTER — Other Ambulatory Visit: Payer: Self-pay | Admitting: Interventional Cardiology

## 2021-09-22 ENCOUNTER — Encounter (HOSPITAL_BASED_OUTPATIENT_CLINIC_OR_DEPARTMENT_OTHER): Payer: Self-pay

## 2021-09-22 ENCOUNTER — Other Ambulatory Visit: Payer: Self-pay

## 2021-09-22 ENCOUNTER — Emergency Department (HOSPITAL_BASED_OUTPATIENT_CLINIC_OR_DEPARTMENT_OTHER): Payer: 59 | Admitting: Radiology

## 2021-09-22 ENCOUNTER — Emergency Department (HOSPITAL_BASED_OUTPATIENT_CLINIC_OR_DEPARTMENT_OTHER)
Admission: EM | Admit: 2021-09-22 | Discharge: 2021-09-23 | Discharge status: Against Medical Advice | Payer: 59 | Attending: Emergency Medicine | Admitting: Emergency Medicine

## 2021-09-22 DIAGNOSIS — I160 Hypertensive urgency: Secondary | ICD-10-CM | POA: Diagnosis not present

## 2021-09-22 DIAGNOSIS — I209 Angina pectoris, unspecified: Secondary | ICD-10-CM

## 2021-09-22 DIAGNOSIS — R079 Chest pain, unspecified: Secondary | ICD-10-CM | POA: Diagnosis not present

## 2021-09-22 DIAGNOSIS — E1122 Type 2 diabetes mellitus with diabetic chronic kidney disease: Secondary | ICD-10-CM | POA: Insufficient documentation

## 2021-09-22 DIAGNOSIS — N189 Chronic kidney disease, unspecified: Secondary | ICD-10-CM

## 2021-09-22 DIAGNOSIS — N179 Acute kidney failure, unspecified: Secondary | ICD-10-CM | POA: Insufficient documentation

## 2021-09-22 DIAGNOSIS — I129 Hypertensive chronic kidney disease with stage 1 through stage 4 chronic kidney disease, or unspecified chronic kidney disease: Secondary | ICD-10-CM | POA: Insufficient documentation

## 2021-09-22 DIAGNOSIS — Z7982 Long term (current) use of aspirin: Secondary | ICD-10-CM | POA: Diagnosis not present

## 2021-09-22 DIAGNOSIS — Z794 Long term (current) use of insulin: Secondary | ICD-10-CM | POA: Insufficient documentation

## 2021-09-22 DIAGNOSIS — Z79899 Other long term (current) drug therapy: Secondary | ICD-10-CM | POA: Diagnosis not present

## 2021-09-22 DIAGNOSIS — Z7984 Long term (current) use of oral hypoglycemic drugs: Secondary | ICD-10-CM | POA: Insufficient documentation

## 2021-09-22 LAB — BASIC METABOLIC PANEL
Anion gap: 12 (ref 5–15)
BUN: 52 mg/dL — ABNORMAL HIGH (ref 6–20)
CO2: 21 mmol/L — ABNORMAL LOW (ref 22–32)
Calcium: 9.4 mg/dL (ref 8.9–10.3)
Chloride: 109 mmol/L (ref 98–111)
Creatinine, Ser: 3.09 mg/dL — ABNORMAL HIGH (ref 0.61–1.24)
GFR, Estimated: 26 mL/min — ABNORMAL LOW (ref >60)
Glucose, Bld: 82 mg/dL (ref 70–99)
Potassium: 3.6 mmol/L (ref 3.5–5.1)
Sodium: 142 mmol/L (ref 135–145)

## 2021-09-22 LAB — CBC
HCT: 34.5 % — ABNORMAL LOW (ref 39.0–52.0)
Hemoglobin: 11 g/dL — ABNORMAL LOW (ref 13.0–17.0)
MCH: 28 pg (ref 26.0–34.0)
MCHC: 31.9 g/dL (ref 30.0–36.0)
MCV: 87.8 fL (ref 80.0–100.0)
Platelets: 191 10*3/uL (ref 150–400)
RBC: 3.93 MIL/uL — ABNORMAL LOW (ref 4.22–5.81)
RDW: 13.1 % (ref 11.5–15.5)
WBC: 7.8 10*3/uL (ref 4.0–10.5)
nRBC: 0 % (ref 0.0–0.2)

## 2021-09-22 LAB — TROPONIN I (HIGH SENSITIVITY): Troponin I (High Sensitivity): 14 ng/L (ref <18)

## 2021-09-22 NOTE — ED Triage Notes (Signed)
Pt presents POV with c/o of chest pain that began last night. ? ?History of MI in August, 2021 with stent placement. ? ?Pain is center of chest radiates to left arm and back. ? ?Denies N/V, shortness of breath, lighheadedness. ? ?Has had some diaphoresis. ? ?Feels fatigued. ? ?Pt ambulatory to triage, he does appear uncomfortable, GCS 15.  ?

## 2021-09-23 LAB — TROPONIN I (HIGH SENSITIVITY): Troponin I (High Sensitivity): 13 ng/L (ref <18)

## 2021-09-23 MED ORDER — NITROGLYCERIN 2 % TD OINT
1.0000 [in_us] | TOPICAL_OINTMENT | Freq: Four times a day (QID) | TRANSDERMAL | Status: DC
  Administered 2021-09-23: 1 [in_us] via TOPICAL
  Filled 2021-09-23: qty 1

## 2021-09-23 MED ORDER — ISOSORBIDE MONONITRATE ER 30 MG PO TB24
30.0000 mg | ORAL_TABLET | Freq: Every day | ORAL | Status: DC
  Filled 2021-09-23: qty 1

## 2021-09-23 MED ORDER — LACTATED RINGERS IV SOLN: INTRAVENOUS | Status: DC

## 2021-09-23 MED ORDER — HYDRALAZINE HCL 25 MG PO TABS
50.0000 mg | ORAL_TABLET | Freq: Once | ORAL | Status: AC
  Administered 2021-09-23: 50 mg via ORAL
  Filled 2021-09-23: qty 2

## 2021-09-23 MED ORDER — CARVEDILOL 12.5 MG PO TABS: 25.0000 mg | ORAL_TABLET | Freq: Two times a day (BID) | ORAL | Status: DC

## 2021-09-23 MED ORDER — LACTATED RINGERS IV BOLUS
500.0000 mL | Freq: Once | INTRAVENOUS | Status: AC
  Administered 2021-09-23: 500 mL via INTRAVENOUS

## 2021-09-23 NOTE — ED Provider Notes (Signed)
?[facility] ?Provider Note ? ? ?CSN: [id number] ?Arrival date & time: 09/22/21  1941 ? ?  ? ?History ? ?Chief Complaint  ?Patient presents with  ? Chest Pain  ? ? ?[name] is a 36 y.o. male. ? ?HPI ? ?HPI: A 51 year old patient with a history of treated diabetes, hypertension, hypercholesterolemia and obesity presents for evaluation of chest pain. Initial onset of pain was more than 6 hours ago. The patient's chest pain is described as heaviness/pressure/tightness and is not worse with exertion. The patient reports some diaphoresis. The patient's chest pain is middle- or left-sided, is not well-localized, is not sharp and does radiate to the arms/jaw/neck. The patient does not complain of nausea. The patient has smoked in the past 90 days. The patient has no history of stroke, has no history of peripheral artery disease and has no relevant family history of coronary artery disease (first degree relative at less than age 66).  ? ?Patient comes in with chief complaint of chest pain.  Chest pain is typical of his ACS pain.  Patient was recently admitted to the hospital for hypertension and chest pain.  This pain is different than the chest pain he had during that admission. ? ?He has been taking his medication as prescribed. ? ?Home Medications ?Prior to Admission medications   ?Medication Sig Start Date End Date Taking? Authorizing Provider  ?aspirin EC 81 MG tablet Take 81 mg by mouth daily. Swallow whole.   Yes [provider]  ?atorvastatin (LIPITOR) 80 MG tablet TAKE 1 TABLET(80 MG) BY MOUTH DAILY ?Patient taking differently: Take 80 mg by mouth daily. 01/20/21  Yes [name], MD  ?carvedilol (COREG) 25 MG tablet TAKE 1 TABLET (25 MG TOTAL) BY MOUTH TWICE A DAY WITH MEALS 09/21/21  Yes [name], MD  ?clopidogrel (PLAVIX) 75 MG tablet TAKE 1 TABLET(75 MG) BY MOUTH DAILY ?Patient taking differently: Take 75 mg by mouth daily. 01/20/21  Yes [name], MD  ?isosorbide-hydrALAZINE (BIDIL) 20-37.5 MG tablet Take 1 tablet by mouth 3 (three) times daily. 08/29/21 09/28/21 Yes Pahwani, Rinka R, MD  ?JARDIANCE 10 MG TABS tablet Take 10 mg by mouth daily. 08/26/21  Yes [provider]  ?LANTUS SOLOSTAR 100 UNIT/ML Solostar Pen Inject 50 Units into the skin at bedtime. 08/09/14  Yes [provider]  ?nitroGLYCERIN (NITROSTAT) 0.4 MG SL tablet Place 1 tablet (0.4 mg total) under the tongue every 5 (five) minutes as needed for chest pain. 02/02/21 08/28/21  [name], MD  ?NOVOLOG FLEXPEN 100 UNIT/ML FlexPen Inject 5-10 Units into the skin See admin instructions. Inject 5-10 units into the skin three times a day before meals, per sliding scale 02/24/18   [provider]  ?pantoprazole (PROTONIX) 20 MG tablet Take 1 tablet (20 mg total) by mouth 2 (two) times daily. ?Patient not taking: Reported on 02/02/2021 01/26/21   Horton, Alvin Critchley, DO  ?   ? ?Allergies    ?Patient has no known allergies.   ? ?Review of Systems   ?Review of Systems  ?All other systems reviewed and are negative. ? ?Physical Exam ?Updated Vital Signs ?BP (!) 181/98   Pulse 78   Temp 98.3 ?F (36.8 ?C)   Resp 15   Ht 6\' 4"  (1.93 m)   Wt 136.1 kg   SpO2 100%   BMI 36.52 kg/m?  ?Physical Exam ?Vitals and nursing note reviewed.  ?Constitutional:   ?   Appearance: He is well-developed.  ?  HENT:  ?   Head: Atraumatic.  ?Cardiovascular:  ?   Rate and Rhythm: Normal rate.  ?Pulmonary:  ?   Effort: Pulmonary effort is normal.  ?Musculoskeletal:  ?   Cervical back: Neck supple.  ?   Right lower leg: No tenderness. No edema.  ?   Left lower leg: No tenderness. No edema.  ?Skin: ?   General: Skin is warm.  ?Neurological:  ?   Mental Status: He is alert and oriented to person, place, and time.  ? ? ?ED Results / Procedures / Treatments   ?Labs ?(all labs ordered are listed, but only abnormal results are displayed) ?Labs Reviewed  ?BASIC METABOLIC PANEL - Abnormal; Notable  for the following components:  ?    Result Value  ? CO2 21 (*)   ? BUN 52 (*)   ? Creatinine, Ser 3.09 (*)   ? GFR, Estimated 26 (*)   ? All other components within normal limits  ?CBC - Abnormal; Notable for the following components:  ? RBC 3.93 (*)   ? Hemoglobin 11.0 (*)   ? HCT 34.5 (*)   ? All other components within normal limits  ?TROPONIN I (HIGH SENSITIVITY)  ?TROPONIN I (HIGH SENSITIVITY)  ? ? ?EKG ?EKG Interpretation ? ?Date/Time:  Tuesday September 22 2021 19:48:29 EDT ?Ventricular Rate:  74 ?PR Interval:  156 ?QRS Duration: 94 ?QT Interval:  408 ?QTC Calculation: 452 ?R Axis:   134 ?Text Interpretation: Normal sinus rhythm Right axis deviation Anteroseptal infarct , age undetermined Abnormal ECG When compared with ECG of 28-Aug-2021 08:13, PREVIOUS ECG IS PRESENT mild ST elevation in the anterior lead- not new Confirmed by Varney Biles [id number]) on 09/23/2021 12:02:12 AM ? ?Radiology ?DG Chest 2 View ? ?Result Date: 09/22/2021 ?CLINICAL DATA:  Chest pain for 2 days EXAM: CHEST - 2 VIEW COMPARISON:  08/28/2021 FINDINGS: The heart size and mediastinal contours are within normal limits. Both lungs are clear. The visualized skeletal structures are unremarkable. IMPRESSION: No active cardiopulmonary disease. Electronically Signed   By: Inez Catalina M.D.   On: 09/22/2021 20:35   ? ?Procedures ?Procedures  ? ? ?Medications Ordered in ED ?Medications  ?carvedilol (COREG) tablet 25 mg (has no administration in time range)  ?nitroGLYCERIN (NITROGLYN) 2 % ointment 1 inch (1 inch Topical Given 09/23/21 0023)  ?lactated ringers infusion ( Intravenous Not Given 09/23/21 0124)  ?isosorbide mononitrate (IMDUR) 24 hr tablet 30 mg (has no administration in time range)  ?hydrALAZINE (APRESOLINE) tablet 50 mg (50 mg Oral Given 09/23/21 0020)  ?lactated ringers bolus 500 mL (0 mLs Intravenous Stopped 09/23/21 0122)  ? ? ?ED Course/ Medical Decision Making/ A&P ?  ?HEAR Score: 5                       ?Medical Decision Making ?Amount  and/or Complexity of Data Reviewed ?Labs: ordered. ?Radiology: ordered. ? ?Risk ?Prescription drug management. ? ? ?This patient presents to the ED with chief complaint(s) of chest pain and noted to have elevated blood pressure with pertinent past medical history of ischemic cardiomyopathy, CHF, poorly controlled hypertension, diabetes which further complicates the presenting complaint. The complaint involves an extensive differential diagnosis and treatment options and also carries with it a high risk of complications and morbidity.   ? ?The differential diagnosis includes : ACS, hypertensive emergency, worsening renal failure. ? ?The initial plan is to order basic labs and reassess the patient.  EKG is no acute STEMI criteria.  Patient  is noted to be hypertensive.  His home meds have been ordered along with an extra dose of hydralazine. ? ? ?Additional history obtained: ?Records reviewed previous admission documents ? ? ?Independent visualization of imaging: ?- Imaging that was independently visualized with scope of interpretation limited to determining acute life threatening conditions related to emergency care: X-ray of the chest, which revealed no pulmonary edema ? ?Treatment and Reassessment: Initial troponin is within normal limit.  Patient's creatinine is however elevated.  He indicates that his chest pain is similar to his ACS pain -there were in the setting of elevated blood pressure, rising creatinine we will admit him to the hospital for hypertensive urgency and acute on chronic renal failure.  ACS can also be ruled out while he is here. ? ?Consultation: ?- Consulted or discussed management/test interpretation w/ external professional: Cardiology service, will see the patient after he is admitted ? ?Reassessment: ?Patient wants to leave against medical advice. ?Patient understands that his actions will lead to inadequate medical workup, and that he is at risk of complications of missed diagnosis, which  includes morbidity and mortality. ? ?Alternative options discussed.  ?Opportunity to change mind give. ?Discussion witnessed by patient's spouse. ?Patient pretty much stating that he has a nephrologist that c

## 2021-09-23 NOTE — ED Notes (Addendum)
Nitro paste removed prior to pt leaving AMA  ?

## 2021-10-02 ENCOUNTER — Emergency Department (HOSPITAL_BASED_OUTPATIENT_CLINIC_OR_DEPARTMENT_OTHER)
Admission: EM | Admit: 2021-10-02 | Discharge: 2021-10-02 | Disposition: A | Discharge status: Home / Self Care | Payer: 59 | Attending: Emergency Medicine | Admitting: Emergency Medicine

## 2021-10-02 ENCOUNTER — Emergency Department (HOSPITAL_BASED_OUTPATIENT_CLINIC_OR_DEPARTMENT_OTHER): Payer: 59 | Admitting: Radiology

## 2021-10-02 ENCOUNTER — Encounter (HOSPITAL_BASED_OUTPATIENT_CLINIC_OR_DEPARTMENT_OTHER): Payer: Self-pay | Admitting: Pediatrics

## 2021-10-02 ENCOUNTER — Other Ambulatory Visit: Payer: Self-pay

## 2021-10-02 DIAGNOSIS — E1122 Type 2 diabetes mellitus with diabetic chronic kidney disease: Secondary | ICD-10-CM | POA: Diagnosis not present

## 2021-10-02 DIAGNOSIS — N189 Chronic kidney disease, unspecified: Secondary | ICD-10-CM | POA: Insufficient documentation

## 2021-10-02 DIAGNOSIS — I2510 Atherosclerotic heart disease of native coronary artery without angina pectoris: Secondary | ICD-10-CM | POA: Diagnosis not present

## 2021-10-02 DIAGNOSIS — Z7902 Long term (current) use of antithrombotics/antiplatelets: Secondary | ICD-10-CM | POA: Diagnosis not present

## 2021-10-02 DIAGNOSIS — Z7982 Long term (current) use of aspirin: Secondary | ICD-10-CM | POA: Diagnosis not present

## 2021-10-02 DIAGNOSIS — I129 Hypertensive chronic kidney disease with stage 1 through stage 4 chronic kidney disease, or unspecified chronic kidney disease: Secondary | ICD-10-CM | POA: Diagnosis not present

## 2021-10-02 DIAGNOSIS — R0789 Other chest pain: Secondary | ICD-10-CM | POA: Insufficient documentation

## 2021-10-02 DIAGNOSIS — R079 Chest pain, unspecified: Secondary | ICD-10-CM | POA: Diagnosis not present

## 2021-10-02 LAB — CBC WITH DIFFERENTIAL/PLATELET
Abs Immature Granulocytes: 0.02 10*3/uL (ref 0.00–0.07)
Basophils Absolute: 0 10*3/uL (ref 0.0–0.1)
Basophils Relative: 1 %
Eosinophils Absolute: 0.2 10*3/uL (ref 0.0–0.5)
Eosinophils Relative: 2 %
HCT: 37.1 % — ABNORMAL LOW (ref 39.0–52.0)
Hemoglobin: 12 g/dL — ABNORMAL LOW (ref 13.0–17.0)
Immature Granulocytes: 0 %
Lymphocytes Relative: 31 %
Lymphs Abs: 2.5 10*3/uL (ref 0.7–4.0)
MCH: 28.2 pg (ref 26.0–34.0)
MCHC: 32.3 g/dL (ref 30.0–36.0)
MCV: 87.1 fL (ref 80.0–100.0)
Monocytes Absolute: 0.6 10*3/uL (ref 0.1–1.0)
Monocytes Relative: 8 %
Neutro Abs: 4.7 10*3/uL (ref 1.7–7.7)
Neutrophils Relative %: 58 %
Platelets: 198 10*3/uL (ref 150–400)
RBC: 4.26 MIL/uL (ref 4.22–5.81)
RDW: 13 % (ref 11.5–15.5)
WBC: 8.1 10*3/uL (ref 4.0–10.5)
nRBC: 0 % (ref 0.0–0.2)

## 2021-10-02 LAB — COMPREHENSIVE METABOLIC PANEL
ALT: 23 U/L (ref 0–44)
AST: 19 U/L (ref 15–41)
Albumin: 3.8 g/dL (ref 3.5–5.0)
Alkaline Phosphatase: 78 U/L (ref 38–126)
Anion gap: 10 (ref 5–15)
BUN: 51 mg/dL — ABNORMAL HIGH (ref 6–20)
CO2: 24 mmol/L (ref 22–32)
Calcium: 8.9 mg/dL (ref 8.9–10.3)
Chloride: 110 mmol/L (ref 98–111)
Creatinine, Ser: 3.26 mg/dL — ABNORMAL HIGH (ref 0.61–1.24)
GFR, Estimated: 24 mL/min — ABNORMAL LOW (ref >60)
Glucose, Bld: 100 mg/dL — ABNORMAL HIGH (ref 70–99)
Potassium: 3.7 mmol/L (ref 3.5–5.1)
Sodium: 144 mmol/L (ref 135–145)
Total Bilirubin: 0.5 mg/dL (ref 0.3–1.2)
Total Protein: 6.8 g/dL (ref 6.5–8.1)

## 2021-10-02 LAB — TROPONIN I (HIGH SENSITIVITY)
Troponin I (High Sensitivity): 10 ng/L (ref <18)
Troponin I (High Sensitivity): 12 ng/L (ref <18)

## 2021-10-02 MED ORDER — HYDRALAZINE HCL 25 MG PO TABS
25.0000 mg | ORAL_TABLET | Freq: Once | ORAL | Status: AC
  Administered 2021-10-02: 25 mg via ORAL
  Filled 2021-10-02: qty 1

## 2021-10-02 MED ORDER — NITROGLYCERIN 0.4 MG SL SUBL
0.4000 mg | SUBLINGUAL_TABLET | SUBLINGUAL | Status: DC | PRN
  Administered 2021-10-02 (×2): 0.4 mg via SUBLINGUAL
  Filled 2021-10-02: qty 1

## 2021-10-02 MED ORDER — ISOSORBIDE MONONITRATE ER 30 MG PO TB24: 30.0000 mg | ORAL_TABLET | Freq: Every day | ORAL | Status: DC

## 2021-10-02 NOTE — Discharge Instructions (Addendum)
Future Appointments  ?Date Time Provider [place]  ?10/08/2021 10:05 AM Leanor Kail, PA [facility]  ?10/22/2021  8:00 AM Henson, Vickie L, NP-C [facility] None  ? ? ?You must follow-up with cardiology.  Please take your medicine as previously prescribed.  If you are having recurrent or worsening chest pain, difficulty in breathing, or other new concerning symptom, please return to the emergency room for reassessment. ? ?

## 2021-10-02 NOTE — ED Provider Notes (Signed)
?[facility] ?Provider Note ? ? ?CSN: [id number] ?Arrival date & time: 10/02/21  1003 ? ?  ? ?History ? ?Chief Complaint  ?Patient presents with  ? Chest Pain  ? ? ?[name] is a 36 y.o. male.  Presented to the emergency department due to concern for chest pain.  Patient states that over the past couple weeks has been having intermittent episodes of chest discomfort.  Seem to come and go at random, described as left-sided, tightness, currently not having pain.  Earlier today it was up to moderate in severity.  Occur at rest and with exertion.  No associated difficulty in breathing.  Patient states that the pain is reminiscent of pain when he had his original heart attack back in 2021. ? ?Completed chart review, reviewed last discharge summary hypertension, hyperlipidemia, MI in 2021,CAD s/p DES, ICM last EF 35 -40%, DM type II, CKD, and GERD ? ?Reviewed last ER visit, similar complaint ? ? ?HPI ? ?  ? ?Home Medications ?Prior to Admission medications   ?Medication Sig Start Date End Date Taking? Authorizing Provider  ?aspirin EC 81 MG tablet Take 81 mg by mouth daily. Swallow whole.    [provider]  ?atorvastatin (LIPITOR) 80 MG tablet TAKE 1 TABLET(80 MG) BY MOUTH DAILY ?Patient taking differently: Take 80 mg by mouth daily. 01/20/21   [name], MD  ?carvedilol (COREG) 25 MG tablet TAKE 1 TABLET (25 MG TOTAL) BY MOUTH TWICE A DAY WITH MEALS 09/21/21   [name], MD  ?clopidogrel (PLAVIX) 75 MG tablet TAKE 1 TABLET(75 MG) BY MOUTH DAILY ?Patient taking differently: Take 75 mg by mouth daily. 01/20/21   [name], MD  ?JARDIANCE 10 MG TABS tablet Take 10 mg by mouth daily. 08/26/21   [provider]  ?LANTUS SOLOSTAR 100 UNIT/ML Solostar Pen Inject 50 Units into the skin at bedtime. 08/09/14   [provider]  ?nitroGLYCERIN (NITROSTAT) 0.4 MG SL tablet Place 1 tablet (0.4 mg total) under the tongue every 5 (five) minutes as  needed for chest pain. 02/02/21 08/28/21  [name], MD  ?NOVOLOG FLEXPEN 100 UNIT/ML FlexPen Inject 5-10 Units into the skin See admin instructions. Inject 5-10 units into the skin three times a day before meals, per sliding scale 02/24/18   [provider]  ?pantoprazole (PROTONIX) 20 MG tablet Take 1 tablet (20 mg total) by mouth 2 (two) times daily. ?Patient not taking: Reported on 02/02/2021 01/26/21   Horton, Alvin Critchley, DO  ?   ? ?Allergies    ?Patient has no known allergies.   ? ?Review of Systems   ?Review of Systems  ?Constitutional:  Negative for chills and fever.  ?HENT:  Negative for ear pain and sore throat.   ?Eyes:  Negative for pain and visual disturbance.  ?Respiratory:  Negative for cough and shortness of breath.   ?Cardiovascular:  Positive for chest pain. Negative for palpitations.  ?Gastrointestinal:  Negative for abdominal pain and vomiting.  ?Genitourinary:  Negative for dysuria and hematuria.  ?Musculoskeletal:  Negative for arthralgias and back pain.  ?Skin:  Negative for color change and rash.  ?Neurological:  Negative for seizures and syncope.  ?All other systems reviewed and are negative. ? ?Physical Exam ?Updated Vital Signs ?BP (!) 191/96   Pulse 75   Temp 98 ?F (36.7 ?C)   Resp 19   Ht 6\' 4"  (1.93 m)   Wt 136.1 kg   SpO2 100%   BMI  36.52 kg/m?  ?Physical Exam ?Vitals and nursing note reviewed.  ?Constitutional:   ?   General: He is not in acute distress. ?   Appearance: He is well-developed.  ?HENT:  ?   Head: Normocephalic and atraumatic.  ?Eyes:  ?   Conjunctiva/sclera: Conjunctivae normal.  ?Cardiovascular:  ?   Rate and Rhythm: Normal rate and regular rhythm.  ?   Heart sounds: No murmur heard. ?Pulmonary:  ?   Effort: Pulmonary effort is normal. No respiratory distress.  ?   Breath sounds: Normal breath sounds.  ?Abdominal:  ?   Palpations: Abdomen is soft.  ?   Tenderness: There is no abdominal tenderness.  ?Musculoskeletal:     ?   General: No swelling.  ?    Cervical back: Neck supple.  ?Skin: ?   General: Skin is warm and dry.  ?   Capillary Refill: Capillary refill takes less than 2 seconds.  ?Neurological:  ?   Mental Status: He is alert.  ?Psychiatric:     ?   Mood and Affect: Mood normal.  ? ? ?ED Results / Procedures / Treatments   ?Labs ?(all labs ordered are listed, but only abnormal results are displayed) ?Labs Reviewed  ?CBC WITH DIFFERENTIAL/PLATELET - Abnormal; Notable for the following components:  ?    Result Value  ? Hemoglobin 12.0 (*)   ? HCT 37.1 (*)   ? All other components within normal limits  ?COMPREHENSIVE METABOLIC PANEL - Abnormal; Notable for the following components:  ? Glucose, Bld 100 (*)   ? BUN 51 (*)   ? Creatinine, Ser 3.26 (*)   ? GFR, Estimated 24 (*)   ? All other components within normal limits  ?TROPONIN I (HIGH SENSITIVITY)  ?TROPONIN I (HIGH SENSITIVITY)  ? ? ?EKG ?EKG Interpretation ? ?Date/Time:  Friday October 02 2021 10:12:38 EDT ?Ventricular Rate:  64 ?PR Interval:  164 ?QRS Duration: 92 ?QT Interval:  418 ?QTC Calculation: 431 ?R Axis:   254 ?Text Interpretation: Normal sinus rhythm Septal infarct (cited on or before 22-Sep-2021) Possible Lateral infarct (cited on or before 22-Sep-2021) Abnormal ECG When compared with ECG of 22-Sep-2021 19:48, No significant change was found Confirmed by Madalyn Rob [phone]) on 10/02/2021 10:42:54 AM ? ?Radiology ?No results found. ? ?Procedures ?Procedures  ? ? ?Medications Ordered in ED ?Medications  ?hydrALAZINE (APRESOLINE) tablet 25 mg (25 mg Oral Given 10/02/21 1043)  ? ? ?ED Course/ Medical Decision Making/ A&P ?Clinical Course as of 10/04/21 1432  ?Fri Oct 02, 2021  ?1153 D/w skains [RD]  ?  ?Clinical Course User Index ?[RD] Lucrezia Starch, MD  ? ?                        ?Medical Decision Making ?Amount and/or Complexity of Data Reviewed ?Labs: ordered. ?Radiology: ordered. ? ?Risk ?Prescription drug management. ? ? ?36 y/o male with hypertension, hyperlipidemia, MI in 2021,CAD  s/p DES, ICM last EF 35 -40%, DM type II, CKD, and GERD presenting to ER with concern for chest pain.  ? ?On exam patient appears well in no distress, initial vitals noted for some hypertension, EKG grossly unchanged from prior.  His initial troponin was normal.  His creatinine today was slightly worse than ER visit 10 days ago.  Appears his creatinine has been steadily worsening over the past year.  Provided patient with some sublingual nitroglycerin and hydralazine.  Had modest initial improvement of blood pressure.  Patient continued to  deny ongoing chest pain.  I discussed the case in detail with Dr. Marlou Porch on-call for cardiology given patient's extensive cardiac history.  Dr. Marlou Porch felt that from a cardiac standpoint patient was appropriate for discharge and outpatient follow-up assuming his second troponin was still normal.  He states that he will contact his team to try to arrange for an outpatient stress test and outpatient clinic follow-up visit.  Notably since patient's last admission in March she has not followed up in the outpatient setting with cardiology.  Based on review of his chart I suspect the hypertension seen today is more chronic in etiology.  I had lengthy discussion with patient regarding his steadily worsening kidney function.  Still offered admission for observation, blood pressure control and further monitoring of his kidney function.  Patient states however he strongly desires to be discharged and pursue outpatient management unless there was evidence that he is having an active heart attack.  Patient's repeat troponin was normal.  Updated patient and he still would like to go home.  He states that he is willing to follow-up with cardiology in clinic.  Feel patient was able to demonstrate understanding of his current clinical condition and we will go ahead and discharge patient at this time.  Had additional lengthy conversation regarding strict return precautions and again stressed the  need that he must follow-up with cardiology for further monitoring of his blood pressure, heart disease and kidney function.  ? ? ? ? ? ? ? ?Final Clinical Impression(s) / ED Diagnoses ?Final diagnoses:  ?C

## 2021-10-02 NOTE — ED Triage Notes (Signed)
C/O chest pain x 1.5 week; reported was seen here for same when it started and was discharged; endorsed hx of heart attack 2 years ago. & this episode feels the same ?

## 2021-10-07 NOTE — Progress Notes (Signed)
?Cardiology Office Note:   ? ?Date:  10/08/2021  ? ?ID:  [name], DOB 1985-07-20, MRN [id number] ? ?PCP:  [name], FNP  ?[facility] Cardiologist:  Larae Grooms, MD  ?[place] Electrophysiologist:  None  ? ?Chief Complaint: Hospital follow up  ? ?History of Present Illness:   ? ?[name] is a 36 y.o. male with a hx of HTN, HLD, IDDM, ICM, CAD and CKD stage III-IV seen for follow up.  ? ?Patient has significant family history of heart disease and high blood pressure on his father side.  His father required LVAD.   ? ? He had anterior STEMI in August 2021.  Initial EKG showed anterior Q waves with marked ST elevation.  Cardiac catheterization on 01/28/2020 by Dr. Irish Lack revealed 100% proximal LAD occlusion treated with Synergy XD 2.5 x 24 mm DES postdilated to greater than 3 mm in diameter, EF 25 to 35% by visual estimate.  Subsequent echocardiogram obtained on 01/29/2020 showed EF 30 to 35% with regional wall motion abnormality, moderate LVH, normal RV.  Repeat echocardiogram on 04/01/2020 showed EF 35 to 40%, regional wall motion abnormality, mild LVH, grade 1 DD, trivial MR.  ? ?Admitted March 2023 for chest pain in setting of hypertensive urgency.  Placed on carvedilol with BiDil.  To be discontinued in setting of renal insufficiency.  Plan to repeat echocardiogram once on maximum tolerated guideline directed medical therapy. ? ?Patient is here for follow-up.  He ran out of BiDil 2 weeks ago.  He did not have a refill.  His chest pain has much improved.  Still having intermittent pressure sensation with or without activity.  He does not exercise.  Mostly sedentary lifestyle.  Denies orthopnea, PND, syncope, shortness of breath, dizziness, palpitation, lower extremity edema or melena. ? ?Past Medical History:  ?Diagnosis Date  ? Coronary artery disease   ? Diabetes mellitus without complication ([place])   ? Hypercholesteremia   ? Hypertension   ? Myocardial infarction [facility])  01/2020  ? ? ?Past Surgical History:  ?Procedure Laterality Date  ? CORONARY STENT INTERVENTION N/A 01/28/2020  ? Procedure: CORONARY STENT INTERVENTION;  Surgeon: [name], MD;  Location: [facility];  Service: Cardiovascular;  Laterality: N/A;  ? CORONARY/GRAFT ACUTE MI REVASCULARIZATION N/A 01/28/2020  ? Procedure: Coronary/Graft Acute MI Revascularization;  Surgeon: [name], MD;  Location: [facility];  Service: Cardiovascular;  Laterality: N/A;  ? LEFT HEART CATH AND CORONARY ANGIOGRAPHY N/A 01/28/2020  ? Procedure: LEFT HEART CATH AND CORONARY ANGIOGRAPHY;  Surgeon: [name], MD;  Location: [facility];  Service: Cardiovascular;  Laterality: N/A;  ? ? ?Current Medications: ?Current Meds  ?Medication Sig  ? aspirin EC 81 MG tablet Take 81 mg by mouth daily. Swallow whole.  ? atorvastatin (LIPITOR) 80 MG tablet TAKE 1 TABLET(80 MG) BY MOUTH DAILY (Patient taking differently: Take 80 mg by mouth daily.)  ? carvedilol (COREG) 25 MG tablet TAKE 1 TABLET (25 MG TOTAL) BY MOUTH TWICE A DAY WITH MEALS  ? chlorthalidone (HYGROTON) 25 MG tablet Take 12.5 mg by mouth every morning.  ? clopidogrel (PLAVIX) 75 MG tablet TAKE 1 TABLET(75 MG) BY MOUTH DAILY (Patient taking differently: Take 75 mg by mouth daily.)  ? isosorbide-hydrALAZINE (BIDIL) 20-37.5 MG tablet Take by mouth 3 (three) times daily.  ? JARDIANCE 10 MG TABS tablet Take 10 mg by mouth daily.  ? LANTUS SOLOSTAR 100 UNIT/ML Solostar Pen Inject 50 Units into  the skin at bedtime.  ? nitroGLYCERIN (NITROSTAT) 0.4 MG SL tablet Place 1 tablet (0.4 mg total) under the tongue every 5 (five) minutes as needed for chest pain.  ? NOVOLOG FLEXPEN 100 UNIT/ML FlexPen Inject 5-10 Units into the skin See admin instructions. Inject 5-10 units into the skin three times a day before meals, per sliding scale  ?  ? ?Allergies:   Patient has no known allergies.  ? ?Social History  ? ?Socioeconomic History  ? Marital status:  Married  ?  Spouse name: Not on file  ? Number of children: Not on file  ? Years of education: Not on file  ? Highest education level: Not on file  ?Occupational History  ? Not on file  ?Tobacco Use  ? Smoking status: Never  ? Smokeless tobacco: Never  ?Vaping Use  ? Vaping Use: Never used  ?Substance and Sexual Activity  ? Alcohol use: No  ? Drug use: No  ? Sexual activity: Not on file  ?Other Topics Concern  ? Not on file  ?Social History Narrative  ? Not on file  ? ?Social Determinants of Health  ? ?Financial Resource Strain: Not on file  ?Food Insecurity: Not on file  ?Transportation Needs: Not on file  ?Physical Activity: Not on file  ?Stress: Not on file  ?Social Connections: Not on file  ?  ? ?Family History: ?The patient's family history includes Diabetes in his father; Heart disease in his father.   ? ?ROS:   ?Please see the history of present illness.    ?All other systems reviewed and are negative.  ? ?EKGs/Labs/Other Studies Reviewed:   ? ?The following studies were reviewed today: ? ?Echo 03/2020 ? 1. Apical akinesis. Severe hypokinesis of the inferoseptum and apical  ?anteroseptum. Left ventricular ejection fraction, by estimation, is 35 to  ?40%. The left ventricle has moderately decreased function. The left  ?ventricle demonstrates regional wall  ?motion abnormalities (see scoring diagram/findings for description). There  ?is mild left ventricular hypertrophy of the posterior segment. Left  ?ventricular diastolic parameters are consistent with Grade I diastolic  ?dysfunction (impaired relaxation).  ? 2. Right ventricular systolic function is normal. The right ventricular  ?size is normal.  ? 3. The mitral valve is normal in structure. Trivial mitral valve  ?regurgitation. No evidence of mitral stenosis.  ? 4. The aortic valve is tricuspid. Aortic valve regurgitation is not  ?visualized. No aortic stenosis is present.  ? 5. The inferior vena cava is normal in size with greater than 50%  ?respiratory  variability, suggesting right atrial pressure of 3 mmHg.  ? ?Comparison(s): 01/30/20 EF 40-45%.  ? ?Coronary/Graft Acute MI Revascularization  01/2020  ?CORONARY STENT INTERVENTION  ?LEFT HEART CATH AND CORONARY ANGIOGRAPHY  ? ?Conclusion ? ?  ?Prox LAD lesion is 100% stenosed. Late presenting anterior MI, as his symptoms have been going on for about 24 hours. ?A drug-eluting stent was successfully placed using a SYNERGY XD 2.50X24, postdilated to greater than 3 mm in diameter. ?Post intervention, there is a 0% residual stenosis. ?There is moderate to severe left ventricular systolic dysfunction with anterior wall hypokinesis. ?LV end diastolic pressure is mildly elevated. ?The left ventricular ejection fraction is 25-35% by visual estimate. ?There is no aortic valve stenosis. ?  ?Watch for 48 hours.  Check echocardiogram.  May need to consider LifeVest. ?  ?Holding lisinopril at this time since his creatinine was elevated in the Cath Lab at the time of procedure. ?  ?1  year of dual antiplatelet therapy recommended along with aggressive secondary prevention. ?  ?Diagnostic ?Dominance: Co-dominant ?Intervention ? ? ? ?EKG:  EKG is not ordered today.   ? ?Recent Labs: ?08/28/2021: B Natriuretic Peptide 130.5 ?10/02/2021: ALT 23; BUN 51; Creatinine, Ser 3.26; Hemoglobin 12.0; Platelets 198; Potassium 3.7; Sodium 144  ?Recent Lipid Panel ?   ?Component Value Date/Time  ? CHOL 131 04/01/2020 0956  ? TRIG 242 (H) 04/01/2020 0956  ? HDL 24 (L) 04/01/2020 0956  ? CHOLHDL 5.5 (H) 04/01/2020 4619  ? CHOLHDL 6.4 01/29/2020 0731  ? VLDL 69 (H) 01/29/2020 0731  ? [place] 67 04/01/2020 0956  ? LDLDIRECT 118.2 (H) 01/28/2020 1242  ? ? ? ?Physical Exam:   ? ?VS:  BP 117/76   Pulse 74   Ht 6\' 4"  (1.93 m)   Wt (!) 307 lb (139.3 kg)   SpO2 98%   BMI 37.37 kg/m?    ? ?Wt Readings from Last 3 Encounters:  ?10/08/21 (!) 307 lb (139.3 kg)  ?10/02/21 300 lb (136.1 kg)  ?09/22/21 300 lb (136.1 kg)  ?  ? ?GEN:  Well nourished, well  developed in no acute distress ?HEENT: Normal ?NECK: No JVD; No carotid bruits ?LYMPHATICS: No lymphadenopathy ?CARDIAC: RRR, no murmurs, rubs, gallops ?RESPIRATORY:  Clear to auscultation without rales, wheezing or rho

## 2021-10-08 ENCOUNTER — Encounter: Payer: Self-pay | Admitting: Physician Assistant

## 2021-10-08 ENCOUNTER — Ambulatory Visit: Payer: 59 | Admitting: Physician Assistant

## 2021-10-08 VITALS — BP 117/76 | HR 74 | Ht 76.0 in | Wt 307.0 lb

## 2021-10-08 DIAGNOSIS — E785 Hyperlipidemia, unspecified: Secondary | ICD-10-CM

## 2021-10-08 DIAGNOSIS — I10 Essential (primary) hypertension: Secondary | ICD-10-CM | POA: Diagnosis not present

## 2021-10-08 DIAGNOSIS — R079 Chest pain, unspecified: Secondary | ICD-10-CM | POA: Diagnosis not present

## 2021-10-08 DIAGNOSIS — N1831 Chronic kidney disease, stage 3a: Secondary | ICD-10-CM

## 2021-10-08 DIAGNOSIS — I25110 Atherosclerotic heart disease of native coronary artery with unstable angina pectoris: Secondary | ICD-10-CM

## 2021-10-08 DIAGNOSIS — E669 Obesity, unspecified: Secondary | ICD-10-CM | POA: Diagnosis not present

## 2021-10-08 NOTE — Patient Instructions (Signed)
Medication Instructions:  ?Your physician recommends that you continue on your current medications as directed. Please refer to the Current Medication list given to you today. ? ?*If you need a refill on your cardiac medications before your next appointment, please call your pharmacy* ? ?Lab Work: ?NONE ? ?Testing/Procedures: ?Your physician has requested that you have en exercise stress myoview. For further information please visit [email or web address]. Please follow instruction sheet, as given.  ? ?Follow-Up: ?At [facility], you and your health needs are our priority.  As part of our continuing mission to provide you with exceptional heart care, we have created designated Provider Care Teams.  These Care Teams include your primary Cardiologist (physician) and Advanced Practice Providers (APPs -  Physician Assistants and Nurse Practitioners) who all work together to provide you with the care you need, when you need it. ? ?Your next appointment:   ?3 month(s) ? ?The format for your next appointment:   ?In Person ? ?Provider:   ?Robbie Lis, PA-C, Christen Bame, NP, or Richardson Dopp, PA-C     ? ? ?Important Information About Sugar ? ? ? ? ?  ?

## 2021-10-09 ENCOUNTER — Encounter (HOSPITAL_COMMUNITY): Payer: Self-pay | Admitting: *Deleted

## 2021-10-12 ENCOUNTER — Other Ambulatory Visit: Payer: Self-pay

## 2021-10-12 ENCOUNTER — Other Ambulatory Visit: Payer: Self-pay | Admitting: Interventional Cardiology

## 2021-10-12 MED ORDER — ISOSORB DINITRATE-HYDRALAZINE 20-37.5 MG PO TABS: 1.0000 | ORAL_TABLET | Freq: Three times a day (TID) | ORAL | 3 refills | Status: DC

## 2021-10-12 NOTE — Telephone Encounter (Signed)
Pt's medication was sent to pt's pharmacy as requested. Confirmation received.  °

## 2021-10-13 ENCOUNTER — Telehealth (HOSPITAL_COMMUNITY): Payer: Self-pay

## 2021-10-13 MED ORDER — ISOSORBIDE DINITRATE 20 MG PO TABS: 20.0000 mg | ORAL_TABLET | Freq: Three times a day (TID) | ORAL | 3 refills | Status: DC

## 2021-10-13 MED ORDER — HYDRALAZINE HCL 25 MG PO TABS: 37.5000 mg | ORAL_TABLET | Freq: Three times a day (TID) | ORAL | 3 refills | Status: DC

## 2021-10-13 NOTE — Telephone Encounter (Signed)
Attempted to contact the patient with instructions for his stress test. His mailbox is full. Will try again later. S.Kabir Brannock EMTP ?

## 2021-10-13 NOTE — Telephone Encounter (Signed)
Note received from pharmacy that alternative requested for Bidil.  I spoke with pharmacy and medication is not on patient's formulary.  When patient was on this medication before he was paying $90 per month out of pocket.  Will check with pharmacist for possible alternatives  ?

## 2021-10-13 NOTE — Telephone Encounter (Signed)
Supple, [name], RPH-CPP ?to Me   ?  4:36 PM ?Would change to generic components of med and send in as 2 different prescriptions ?

## 2021-10-13 NOTE — Addendum Note (Signed)
Addended by: Thompson Grayer on: 10/13/2021 05:25 PM ? ? Modules accepted: Orders ? ?

## 2021-10-13 NOTE — Telephone Encounter (Signed)
Prescription sent to [facility].  I spoke with pharmacy and they will make patient aware of change ?

## 2021-10-15 ENCOUNTER — Ambulatory Visit (HOSPITAL_COMMUNITY): Payer: 59 | Attending: Internal Medicine

## 2021-10-15 ENCOUNTER — Encounter (HOSPITAL_COMMUNITY): Payer: Self-pay | Admitting: Physician Assistant

## 2021-10-16 ENCOUNTER — Ambulatory Visit (HOSPITAL_COMMUNITY): Payer: 59

## 2021-10-19 ENCOUNTER — Other Ambulatory Visit: Payer: Self-pay | Admitting: Interventional Cardiology

## 2021-10-22 ENCOUNTER — Ambulatory Visit: Payer: 59 | Admitting: Family Medicine

## 2021-11-17 ENCOUNTER — Other Ambulatory Visit: Payer: Self-pay | Admitting: Physician Assistant

## 2021-12-09 ENCOUNTER — Other Ambulatory Visit: Payer: Self-pay

## 2021-12-09 ENCOUNTER — Emergency Department (HOSPITAL_BASED_OUTPATIENT_CLINIC_OR_DEPARTMENT_OTHER): Payer: 59 | Admitting: Radiology

## 2021-12-09 ENCOUNTER — Emergency Department (HOSPITAL_BASED_OUTPATIENT_CLINIC_OR_DEPARTMENT_OTHER)
Admission: EM | Admit: 2021-12-09 | Discharge: 2021-12-09 | Disposition: A | Discharge status: Home / Self Care | Payer: 59 | Attending: Emergency Medicine | Admitting: Emergency Medicine

## 2021-12-09 DIAGNOSIS — Z7982 Long term (current) use of aspirin: Secondary | ICD-10-CM | POA: Diagnosis not present

## 2021-12-09 DIAGNOSIS — R0789 Other chest pain: Secondary | ICD-10-CM | POA: Diagnosis not present

## 2021-12-09 DIAGNOSIS — R079 Chest pain, unspecified: Secondary | ICD-10-CM | POA: Diagnosis not present

## 2021-12-09 DIAGNOSIS — I2510 Atherosclerotic heart disease of native coronary artery without angina pectoris: Secondary | ICD-10-CM | POA: Insufficient documentation

## 2021-12-09 LAB — CBC
HCT: 33.7 % — ABNORMAL LOW (ref 39.0–52.0)
Hemoglobin: 11 g/dL — ABNORMAL LOW (ref 13.0–17.0)
MCH: 27.8 pg (ref 26.0–34.0)
MCHC: 32.6 g/dL (ref 30.0–36.0)
MCV: 85.3 fL (ref 80.0–100.0)
Platelets: 206 10*3/uL (ref 150–400)
RBC: 3.95 MIL/uL — ABNORMAL LOW (ref 4.22–5.81)
RDW: 13.3 % (ref 11.5–15.5)
WBC: 8.3 10*3/uL (ref 4.0–10.5)
nRBC: 0 % (ref 0.0–0.2)

## 2021-12-09 LAB — TROPONIN I (HIGH SENSITIVITY)
Troponin I (High Sensitivity): 11 ng/L (ref <18)
Troponin I (High Sensitivity): 11 ng/L (ref <18)

## 2021-12-09 LAB — BASIC METABOLIC PANEL
Anion gap: 11 (ref 5–15)
BUN: 46 mg/dL — ABNORMAL HIGH (ref 6–20)
CO2: 22 mmol/L (ref 22–32)
Calcium: 9.6 mg/dL (ref 8.9–10.3)
Chloride: 109 mmol/L (ref 98–111)
Creatinine, Ser: 3.67 mg/dL — ABNORMAL HIGH (ref 0.61–1.24)
GFR, Estimated: 21 mL/min — ABNORMAL LOW (ref >60)
Glucose, Bld: 82 mg/dL (ref 70–99)
Potassium: 3.5 mmol/L (ref 3.5–5.1)
Sodium: 142 mmol/L (ref 135–145)

## 2021-12-09 MED ORDER — NITROGLYCERIN 0.4 MG SL SUBL
0.4000 mg | SUBLINGUAL_TABLET | Freq: Once | SUBLINGUAL | Status: AC
  Administered 2021-12-09: 0.4 mg via SUBLINGUAL
  Filled 2021-12-09: qty 1

## 2021-12-09 NOTE — ED Triage Notes (Signed)
Pt via POV c/o constant centralized CP onset this morning. Pain associated with dizziness, fatigue, and back pain. Similar s/s to previous MI.

## 2021-12-09 NOTE — ED Provider Notes (Signed)
[facility] EMERGENCY DEPT Provider Note   CSN: [id number] Arrival date & time: 12/09/21  2039     History  Chief Complaint  Patient presents with   Chest Pain    [name] is a 36 y.o. male.  Past medical history of coronary artery disease.  Presented to ER for chest pain.  Since patient reports late last night/this morning she started having some intermittent chest discomfort.  Seems to come and go, not associated with exertion, no difficulty in breathing.  Pain is central, discomfort, mild to moderate.  Does not radiate.  Denies abdominal pain, nausea or vomiting.  Patient endorses compliance with all of his cardiac medications including his antiplatelet agents.  Additional history obtained from review of chart, review of heart catheterization in 2021, review of last cardiology note in April 2023.  LAD 100% stenosed.  Stent placed.  HPI     Home Medications Prior to Admission medications   Medication Sig Start Date End Date Taking? Authorizing Provider  aspirin EC 81 MG tablet Take 81 mg by mouth daily. Swallow whole.    [provider]  atorvastatin (LIPITOR) 80 MG tablet TAKE 1 TABLET(80 MG) BY MOUTH DAILY Patient taking differently: Take 80 mg by mouth daily. 01/20/21   [name], MD  carvedilol (COREG) 25 MG tablet Take 1 tablet by mouth in the morning and at bedtime. 11/17/21   Bhagat, Crista Luria, PA  chlorthalidone (HYGROTON) 25 MG tablet Take 12.5 mg by mouth every morning. 09/30/21   [provider]  clopidogrel (PLAVIX) 75 MG tablet TAKE 1 TABLET(75 MG) BY MOUTH DAILY Patient taking differently: Take 75 mg by mouth daily. 01/20/21   [name], MD  hydrALAZINE (APRESOLINE) 25 MG tablet Take 1.5 tablets (37.5 mg total) by mouth 3 (three) times daily. 10/13/21   [name], MD  isosorbide dinitrate (ISORDIL) 20 MG tablet Take 1 tablet (20 mg total) by mouth 3 (three) times daily. 10/13/21   [name],  MD  JARDIANCE 10 MG TABS tablet Take 10 mg by mouth daily. 08/26/21   [provider]  LANTUS SOLOSTAR 100 UNIT/ML Solostar Pen Inject 50 Units into the skin at bedtime. 08/09/14   [provider]  nitroGLYCERIN (NITROSTAT) 0.4 MG SL tablet Place 1 tablet (0.4 mg total) under the tongue every 5 (five) minutes as needed for chest pain. 02/02/21 10/08/21  [name], MD  NOVOLOG FLEXPEN 100 UNIT/ML FlexPen Inject 5-10 Units into the skin See admin instructions. Inject 5-10 units into the skin three times a day before meals, per sliding scale 02/24/18   [provider]      Allergies    Patient has no known allergies.    Review of Systems   Review of Systems  Constitutional:  Negative for chills and fever.  HENT:  Negative for ear pain and sore throat.   Eyes:  Negative for pain and visual disturbance.  Respiratory:  Negative for cough and shortness of breath.   Cardiovascular:  Positive for chest pain. Negative for palpitations.  Gastrointestinal:  Negative for abdominal pain and vomiting.  Genitourinary:  Negative for dysuria and hematuria.  Musculoskeletal:  Negative for arthralgias and back pain.  Skin:  Negative for color change and rash.  Neurological:  Negative for seizures and syncope.  All other systems reviewed and are negative.   Physical Exam Updated Vital Signs BP (!) 156/99   Pulse 72   Temp 97.8 F (36.6 C)  Resp 12   Ht 6\' 4"  (1.93 m)   Wt (!) 143.9 kg   SpO2 95%   BMI 38.62 kg/m  Physical Exam Vitals and nursing note reviewed.  Constitutional:      General: He is not in acute distress.    Appearance: He is well-developed.  HENT:     Head: Normocephalic and atraumatic.  Eyes:     Conjunctiva/sclera: Conjunctivae normal.  Cardiovascular:     Rate and Rhythm: Normal rate and regular rhythm.     Heart sounds: No murmur heard. Pulmonary:     Effort: Pulmonary effort is normal. No respiratory distress.     Breath sounds:  Normal breath sounds.  Abdominal:     Palpations: Abdomen is soft.     Tenderness: There is no abdominal tenderness.  Musculoskeletal:        General: No swelling.     Cervical back: Neck supple.  Skin:    General: Skin is warm and dry.     Capillary Refill: Capillary refill takes less than 2 seconds.  Neurological:     Mental Status: He is alert.  Psychiatric:        Mood and Affect: Mood normal.     ED Results / Procedures / Treatments   Labs (all labs ordered are listed, but only abnormal results are displayed) Labs Reviewed  BASIC METABOLIC PANEL - Abnormal; Notable for the following components:      Result Value   BUN 46 (*)    Creatinine, Ser 3.67 (*)    GFR, Estimated 21 (*)    All other components within normal limits  CBC - Abnormal; Notable for the following components:   RBC 3.95 (*)    Hemoglobin 11.0 (*)    HCT 33.7 (*)    All other components within normal limits  TROPONIN I (HIGH SENSITIVITY)  TROPONIN I (HIGH SENSITIVITY)    EKG EKG Interpretation  Date/Time:  Wednesday December 09 2021 20:45:21 EDT Ventricular Rate:  73 PR Interval:  146 QRS Duration: 94 QT Interval:  408 QTC Calculation: 449 R Axis:   -80 Text Interpretation: Normal sinus rhythm Left anterior fascicular block Septal infarct (cited on or before 22-Sep-2021) Possible Lateral infarct (cited on or before 22-Sep-2021) Abnormal ECG When compared with ECG of 02-Oct-2021 10:12, No significant change was found Confirmed by Madalyn Rob [phone]) on 12/09/2021 10:11:55 PM  Radiology DG Chest 2 View  Result Date: 12/09/2021 CLINICAL DATA:  Chest pain. EXAM: CHEST - 2 VIEW COMPARISON:  Chest x-ray 08/28/2021 FINDINGS: The heart size and mediastinal contours are within normal limits. Both lungs are clear. The visualized skeletal structures are unremarkable. IMPRESSION: No active cardiopulmonary disease. Electronically Signed   By: Ronney Asters M.D.   On: 12/09/2021 21:45     Procedures Procedures    Medications Ordered in ED Medications  nitroGLYCERIN (NITROSTAT) SL tablet 0.4 mg (0.4 mg Sublingual Given 12/09/21 2241)    ED Course/ Medical Decision Making/ A&P                           Medical Decision Making Amount and/or Complexity of Data Reviewed Labs: ordered. Radiology: ordered.  Risk Prescription drug management.   36 year old gentleman presenting to the emergency department due to concern for chest pain.  He has a notable history of coronary artery disease.  His EKG today does not have any obvious new ischemic changes.  His troponin x2 is within normal limits.  His CXR is within normal limits.  I do not appreciate any infiltrate or edema on my independent interpretation and review of x-ray.  Given his significant cardiac history I discussed the case with cardiology on-call, Dr. Humphrey Rolls.  He advises that if patient is having ongoing pain then patient should be admitted but if patient is pain-free then given his work-up he can be managed in an outpatient basis.  I reassessed patient and he denies any ongoing symptoms his vital signs are grossly within normal limits.  I discussed the recommendations from cardiology that he have very close follow-up with his cardiologist.  Patient is agreeable.  I reviewed return precautions in detail should he have any sort of recurrence of chest pain or other ACS type symptoms.    After the discussed management above, the patient was determined to be safe for discharge.  The patient was in agreement with this plan and all questions regarding their care were answered.  ED return precautions were discussed and the patient will return to the ED with any significant worsening of condition.         Final Clinical Impression(s) / ED Diagnoses Final diagnoses:  Chest discomfort    Rx / DC Orders ED Discharge Orders          Ordered    Ambulatory referral to Cardiology        12/09/21 2343               Lucrezia Starch, MD 12/11/21 1753

## 2021-12-09 NOTE — Discharge Instructions (Addendum)
Follow up with your cardiologist. Call their office tomorrow to get appointment ASAP. Come back to ER if you have any recurrent chest pain, difficulty breathing or other new concerning symptom.

## 2021-12-10 ENCOUNTER — Other Ambulatory Visit: Payer: Self-pay | Admitting: Physician Assistant

## 2021-12-27 DIAGNOSIS — I5020 Unspecified systolic (congestive) heart failure: Secondary | ICD-10-CM | POA: Insufficient documentation

## 2021-12-27 NOTE — Progress Notes (Deleted)
Cardiology Office Note:    Date:  12/27/2021   ID:  [name], DOB June 19, 1985, MRN [id number]  PCP:  [name], [facility] Cardiologist:  Larae Grooms, MD { Click to update primary MD,subspecialty MD or APP then REFRESH:1}  *** Referring MD: [name], FNP   Chief Complaint:  No chief complaint on file. {Click here for Visit Info    :1}   Patient Profile: Coronary artery disease  Ant STEMI in 8/2021s/p DES to pLAD (HFrEF) heart failure with reduced ejection fraction  Ischemic cardiomyopathy Cath 8/21: EF 25-35 Echocardiogram 8/21: EF 30-35 Echocardiogram 10/21: EF 35-40 Diabetes mellitus  Chronic kidney disease  Hypertension  Hyperlipidemia  FHx of CAD  Prior CV Studies: LEFT HEART CATH AND CORONARY ANGIOGRAPHY, LEFT HEART CATH AND CORONARY ANGIOGRAPHY, LEFT HEART CATH AND CORONARY ANGIOGRAPHY 01/28/2020  Prox LAD lesion is 100% stenosed. Late presenting anterior MI, as his symptoms have been going on for about 24 hours.  A drug-eluting stent was successfully placed using a SYNERGY XD 2.50X24, postdilated to greater than 3 mm in diameter.  RCA luminal irregularities   There is moderate to severe left ventricular systolic dysfunction with anterior wall hypokinesis. EF 25-35% by visual estimate.    ECHO COMPLETE WITH IMAGING ENHANCING AGENT 04/01/2020 1. Apical akinesis. Severe hypokinesis of the inferoseptum and apical anteroseptum. Left ventricular ejection fraction, by estimation, is 35 to 40%. The left ventricle has moderately decreased function. The left ventricle demonstrates regional wall motion abnormalities (see scoring diagram/findings for description). There is mild left ventricular hypertrophy of the posterior segment. Left ventricular diastolic parameters are consistent with Grade I diastolic dysfunction (impaired relaxation). 2. Right ventricular systolic function is normal. The right ventricular size is  normal. 3. The mitral valve is normal in structure. Trivial mitral valve regurgitation. No evidence of mitral stenosis. 4. The aortic valve is tricuspid. Aortic valve regurgitation is not visualized. No aortic stenosis is present. 5. The inferior vena cava is normal in size with greater than 50% respiratory variability, suggesting right atrial pressure of 3 mmHg.   ***  History of Present Illness:   [name] is a 36 y.o. male with the above problem list.  He was last seen in clinic by Leanor Kail, PA-C 10/08/21. He was having chest pain and he had been out of his BiDil. BiDil was restarted. A Myoview was ordered but never done. He was seen in the ED on 12/09/21 with chest pain. His hs-Trops were neg. CXR showed no acute changes. EKG showed NSR, subtle TWI in 1, aVL and ant-sept Qs. There were no changes compared to previous. He returns for f/u. ***    Past Medical History:  Diagnosis Date   Coronary artery disease    Diabetes mellitus without complication ([place])    Hypercholesteremia    Hypertension    Myocardial infarction ([place]) 01/2020   Current Medications: No outpatient medications have been marked as taking for the 12/28/21 encounter (Appointment) with Richardson Dopp T, PA-C.    Allergies:   Patient has no known allergies.   Social History   Tobacco Use   Smoking status: Never   Smokeless tobacco: Never  Vaping Use   Vaping Use: Never used  Substance Use Topics   Alcohol use: No   Drug use: No    Family Hx: The patient's family history includes Diabetes in his father; Heart disease in his father.  ROS   EKGs/Labs/Other Test Reviewed:  EKG:  EKG is *** ordered today.  The ekg ordered today demonstrates ***  Recent Labs: 08/28/2021: B Natriuretic Peptide 130.5 10/02/2021: ALT 23 12/09/2021: BUN 46; Creatinine, Ser 3.67; Hemoglobin 11.0; Platelets 206; Potassium 3.5; Sodium 142   Recent Lipid Panel No results for input(s): "CHOL", "TRIG", "HDL", "VLDL",  "LDLCALC", "LDLDIRECT" in the last 8760 hours.   Risk Assessment/Calculations/Metrics:   {Does this patient have ATRIAL FIBRILLATION?:[phone]}     No BP recorded.  {Refresh Note OR Click here to enter BP  :1}***    Physical Exam:    VS:  There were no vitals taken for this visit.    Wt Readings from Last 3 Encounters:  12/09/21 (!) 317 lb 3.9 oz (143.9 kg)  10/08/21 (!) 307 lb (139.3 kg)  10/02/21 300 lb (136.1 kg)    Physical Exam ***     ASSESSMENT & PLAN:   No problem-specific Assessment & Plan notes found for this encounter.        {Are you ordering a CV Procedure (e.g. stress test, cath, DCCV, TEE, etc)?   Press F2        :[id number]}   Dispo:  No follow-ups on file.   Medication Adjustments/Labs and Tests Ordered: Current medicines are reviewed at length with the patient today.  Concerns regarding medicines are outlined above.  Tests Ordered: No orders of the defined types were placed in this encounter.  Medication Changes: No orders of the defined types were placed in this encounter.  Signed, Richardson Dopp, PA-C  12/27/2021 5:50 AM    [address], [place], [place]  [address] Phone: [phone]; Fax: [phone]

## 2021-12-28 ENCOUNTER — Ambulatory Visit: Payer: 59 | Admitting: Physician Assistant

## 2021-12-28 DIAGNOSIS — N184 Chronic kidney disease, stage 4 (severe): Secondary | ICD-10-CM

## 2021-12-28 DIAGNOSIS — I5020 Unspecified systolic (congestive) heart failure: Secondary | ICD-10-CM

## 2021-12-28 DIAGNOSIS — E785 Hyperlipidemia, unspecified: Secondary | ICD-10-CM

## 2021-12-28 DIAGNOSIS — I10 Essential (primary) hypertension: Secondary | ICD-10-CM

## 2021-12-28 DIAGNOSIS — I25119 Atherosclerotic heart disease of native coronary artery with unspecified angina pectoris: Secondary | ICD-10-CM

## 2022-01-14 ENCOUNTER — Emergency Department (HOSPITAL_COMMUNITY)
Admission: EM | Admit: 2022-01-14 | Discharge: 2022-01-14 | Discharge status: Against Medical Advice | Payer: 59 | Attending: Emergency Medicine | Admitting: Emergency Medicine

## 2022-01-14 ENCOUNTER — Encounter (HOSPITAL_COMMUNITY): Payer: Self-pay | Admitting: Emergency Medicine

## 2022-01-14 ENCOUNTER — Emergency Department (HOSPITAL_COMMUNITY): Payer: 59

## 2022-01-14 DIAGNOSIS — R519 Headache, unspecified: Secondary | ICD-10-CM | POA: Diagnosis not present

## 2022-01-14 DIAGNOSIS — R079 Chest pain, unspecified: Secondary | ICD-10-CM | POA: Insufficient documentation

## 2022-01-14 DIAGNOSIS — R110 Nausea: Secondary | ICD-10-CM | POA: Insufficient documentation

## 2022-01-14 DIAGNOSIS — Z5321 Procedure and treatment not carried out due to patient leaving prior to being seen by health care provider: Secondary | ICD-10-CM | POA: Diagnosis not present

## 2022-01-14 DIAGNOSIS — R42 Dizziness and giddiness: Secondary | ICD-10-CM | POA: Insufficient documentation

## 2022-01-14 DIAGNOSIS — R0789 Other chest pain: Secondary | ICD-10-CM | POA: Diagnosis not present

## 2022-01-14 LAB — CBC WITH DIFFERENTIAL/PLATELET
Abs Immature Granulocytes: 0.04 10*3/uL (ref 0.00–0.07)
Basophils Absolute: 0 10*3/uL (ref 0.0–0.1)
Basophils Relative: 0 %
Eosinophils Absolute: 0.2 10*3/uL (ref 0.0–0.5)
Eosinophils Relative: 2 %
HCT: 34.9 % — ABNORMAL LOW (ref 39.0–52.0)
Hemoglobin: 11.6 g/dL — ABNORMAL LOW (ref 13.0–17.0)
Immature Granulocytes: 0 %
Lymphocytes Relative: 21 %
Lymphs Abs: 2 10*3/uL (ref 0.7–4.0)
MCH: 28.8 pg (ref 26.0–34.0)
MCHC: 33.2 g/dL (ref 30.0–36.0)
MCV: 86.6 fL (ref 80.0–100.0)
Monocytes Absolute: 0.7 10*3/uL (ref 0.1–1.0)
Monocytes Relative: 7 %
Neutro Abs: 6.6 10*3/uL (ref 1.7–7.7)
Neutrophils Relative %: 70 %
Platelets: 204 10*3/uL (ref 150–400)
RBC: 4.03 MIL/uL — ABNORMAL LOW (ref 4.22–5.81)
RDW: 13.2 % (ref 11.5–15.5)
WBC: 9.5 10*3/uL (ref 4.0–10.5)
nRBC: 0 % (ref 0.0–0.2)

## 2022-01-14 LAB — BASIC METABOLIC PANEL
Anion gap: 12 (ref 5–15)
BUN: 53 mg/dL — ABNORMAL HIGH (ref 6–20)
CO2: 21 mmol/L — ABNORMAL LOW (ref 22–32)
Calcium: 9.5 mg/dL (ref 8.9–10.3)
Chloride: 106 mmol/L (ref 98–111)
Creatinine, Ser: 4.57 mg/dL — ABNORMAL HIGH (ref 0.61–1.24)
GFR, Estimated: 16 mL/min — ABNORMAL LOW (ref >60)
Glucose, Bld: 114 mg/dL — ABNORMAL HIGH (ref 70–99)
Potassium: 3.7 mmol/L (ref 3.5–5.1)
Sodium: 139 mmol/L (ref 135–145)

## 2022-01-14 LAB — TROPONIN I (HIGH SENSITIVITY): Troponin I (High Sensitivity): 11 ng/L (ref <18)

## 2022-01-14 MED ORDER — ASPIRIN 81 MG PO CHEW
324.0000 mg | CHEWABLE_TABLET | Freq: Once | ORAL | Status: AC
  Administered 2022-01-14: 324 mg via ORAL
  Filled 2022-01-14: qty 4

## 2022-01-14 NOTE — ED Provider Triage Note (Signed)
Emergency Medicine Provider Triage Evaluation Note  [name] , a 36 y.o. male  was evaluated in triage.  Pt complains of chest pain..  Pain started this morning and woke patient from sleep.  Reports it is a constant central pressure radiating to the left arm.  Some associated nausea and dizziness.  Reports it feels similar to pain before he had his 2 stents placed a year ago.  Took aspirin this morning, did not take nitro because it does not tend to help his chest pain gives him a severe headache.  Review of Systems  Positive: Chest pain, dizziness, nausea, shortness of breath Negative: Abdominal pain  Physical Exam  BP 94/68   Pulse 77   Temp 98.2 F (36.8 C) (Oral)   Resp 18   SpO2 95%  Gen:   Awake, no distress   Resp:  Normal effort, CTA bilat, RRR MSK:   Moves extremities without difficulty  Other:    Medical Decision Making  Medically screening exam initiated at 4:01 PM.  Appropriate orders placed.  [name] was informed that the remainder of the evaluation will be completed by another provider, this initial triage assessment does not replace that evaluation, and the importance of remaining in the ED until their evaluation is complete.     Jacqlyn Larsen, [place] 01/14/22 1630

## 2022-01-14 NOTE — ED Notes (Signed)
Pt called for vitals with no response x4

## 2022-01-14 NOTE — ED Triage Notes (Signed)
Patient with chest pain, dizziness while walking, and nausea starting this AM when waking up. Patient states it is in his central chest and radiates to the left arm, describes it as a pressure. Pt with hx of stent 2 years ago. Patient states that this feels similar to his last heart attack. Chest pain persists even while patient is at rest.

## 2022-01-15 ENCOUNTER — Encounter: Payer: Self-pay | Admitting: *Deleted

## 2022-01-22 ENCOUNTER — Ambulatory Visit: Payer: 59 | Admitting: Physician Assistant

## 2022-01-30 ENCOUNTER — Other Ambulatory Visit: Payer: Self-pay | Admitting: Interventional Cardiology

## 2022-02-13 ENCOUNTER — Other Ambulatory Visit: Payer: Self-pay | Admitting: Interventional Cardiology

## 2022-02-24 ENCOUNTER — Other Ambulatory Visit: Payer: Self-pay

## 2022-02-24 ENCOUNTER — Emergency Department (HOSPITAL_COMMUNITY): Payer: 59

## 2022-02-24 ENCOUNTER — Emergency Department (HOSPITAL_COMMUNITY)
Admission: EM | Admit: 2022-02-24 | Discharge: 2022-02-24 | Disposition: A | Discharge status: Home / Self Care | Payer: 59 | Attending: Emergency Medicine | Admitting: Emergency Medicine

## 2022-02-24 ENCOUNTER — Encounter (HOSPITAL_COMMUNITY): Payer: Self-pay | Admitting: Emergency Medicine

## 2022-02-24 DIAGNOSIS — I10 Essential (primary) hypertension: Secondary | ICD-10-CM | POA: Diagnosis not present

## 2022-02-24 DIAGNOSIS — R29818 Other symptoms and signs involving the nervous system: Secondary | ICD-10-CM | POA: Diagnosis not present

## 2022-02-24 DIAGNOSIS — Z20822 Contact with and (suspected) exposure to covid-19: Secondary | ICD-10-CM | POA: Insufficient documentation

## 2022-02-24 DIAGNOSIS — R079 Chest pain, unspecified: Secondary | ICD-10-CM | POA: Diagnosis not present

## 2022-02-24 DIAGNOSIS — J341 Cyst and mucocele of nose and nasal sinus: Secondary | ICD-10-CM | POA: Diagnosis not present

## 2022-02-24 DIAGNOSIS — R0789 Other chest pain: Secondary | ICD-10-CM | POA: Diagnosis not present

## 2022-02-24 LAB — RESP PANEL BY RT-PCR (FLU A&B, COVID) ARPGX2
Influenza A by PCR: NEGATIVE
Influenza B by PCR: NEGATIVE
SARS Coronavirus 2 by RT PCR: NEGATIVE

## 2022-02-24 LAB — COMPREHENSIVE METABOLIC PANEL
ALT: 23 U/L (ref 0–44)
AST: 21 U/L (ref 15–41)
Albumin: 3.5 g/dL (ref 3.5–5.0)
Alkaline Phosphatase: 82 U/L (ref 38–126)
Anion gap: 9 (ref 5–15)
BUN: 45 mg/dL — ABNORMAL HIGH (ref 6–20)
CO2: 21 mmol/L — ABNORMAL LOW (ref 22–32)
Calcium: 9.1 mg/dL (ref 8.9–10.3)
Chloride: 112 mmol/L — ABNORMAL HIGH (ref 98–111)
Creatinine, Ser: 3.57 mg/dL — ABNORMAL HIGH (ref 0.61–1.24)
GFR, Estimated: 21 mL/min — ABNORMAL LOW (ref >60)
Glucose, Bld: 124 mg/dL — ABNORMAL HIGH (ref 70–99)
Potassium: 3.5 mmol/L (ref 3.5–5.1)
Sodium: 142 mmol/L (ref 135–145)
Total Bilirubin: 0.8 mg/dL (ref 0.3–1.2)
Total Protein: 6.7 g/dL (ref 6.5–8.1)

## 2022-02-24 LAB — URINALYSIS, ROUTINE W REFLEX MICROSCOPIC
Bilirubin Urine: NEGATIVE
Glucose, UA: >=500 mg/dL — AB
Hgb urine dipstick: NEGATIVE
Ketones, ur: NEGATIVE mg/dL
Leukocytes,Ua: NEGATIVE
Nitrite: NEGATIVE
Protein, ur: 100 mg/dL — AB
Specific Gravity, Urine: 1.012 (ref 1.005–1.030)
pH: 5 (ref 5.0–8.0)

## 2022-02-24 LAB — CBC
HCT: 33.3 % — ABNORMAL LOW (ref 39.0–52.0)
Hemoglobin: 11 g/dL — ABNORMAL LOW (ref 13.0–17.0)
MCH: 29.1 pg (ref 26.0–34.0)
MCHC: 33 g/dL (ref 30.0–36.0)
MCV: 88.1 fL (ref 80.0–100.0)
Platelets: 178 10*3/uL (ref 150–400)
RBC: 3.78 MIL/uL — ABNORMAL LOW (ref 4.22–5.81)
RDW: 13.4 % (ref 11.5–15.5)
WBC: 7.8 10*3/uL (ref 4.0–10.5)
nRBC: 0 % (ref 0.0–0.2)

## 2022-02-24 LAB — RAPID URINE DRUG SCREEN, HOSP PERFORMED
Amphetamines: NOT DETECTED
Barbiturates: NOT DETECTED
Benzodiazepines: NOT DETECTED
Cocaine: NOT DETECTED
Opiates: NOT DETECTED
Tetrahydrocannabinol: NOT DETECTED

## 2022-02-24 LAB — I-STAT CHEM 8, ED
BUN: 41 mg/dL — ABNORMAL HIGH (ref 6–20)
Calcium, Ion: 1.17 mmol/L (ref 1.15–1.40)
Chloride: 110 mmol/L (ref 98–111)
Creatinine, Ser: 3.8 mg/dL — ABNORMAL HIGH (ref 0.61–1.24)
Glucose, Bld: 123 mg/dL — ABNORMAL HIGH (ref 70–99)
HCT: 31 % — ABNORMAL LOW (ref 39.0–52.0)
Hemoglobin: 10.5 g/dL — ABNORMAL LOW (ref 13.0–17.0)
Potassium: 3.5 mmol/L (ref 3.5–5.1)
Sodium: 142 mmol/L (ref 135–145)
TCO2: 22 mmol/L (ref 22–32)

## 2022-02-24 LAB — DIFFERENTIAL
Abs Immature Granulocytes: 0.02 10*3/uL (ref 0.00–0.07)
Basophils Absolute: 0.1 10*3/uL (ref 0.0–0.1)
Basophils Relative: 1 %
Eosinophils Absolute: 0.2 10*3/uL (ref 0.0–0.5)
Eosinophils Relative: 2 %
Immature Granulocytes: 0 %
Lymphocytes Relative: 27 %
Lymphs Abs: 2.1 10*3/uL (ref 0.7–4.0)
Monocytes Absolute: 0.5 10*3/uL (ref 0.1–1.0)
Monocytes Relative: 7 %
Neutro Abs: 4.9 10*3/uL (ref 1.7–7.7)
Neutrophils Relative %: 63 %

## 2022-02-24 LAB — PROTIME-INR
INR: 1.1 (ref 0.8–1.2)
Prothrombin Time: 14.2 seconds (ref 11.4–15.2)

## 2022-02-24 LAB — APTT: aPTT: 27 seconds (ref 24–36)

## 2022-02-24 LAB — TROPONIN I (HIGH SENSITIVITY)
Troponin I (High Sensitivity): 14 ng/L (ref <18)
Troponin I (High Sensitivity): 14 ng/L (ref <18)

## 2022-02-24 LAB — ETHANOL: Alcohol, Ethyl (B): <10 mg/dL (ref <10)

## 2022-02-24 MED ORDER — HYDRALAZINE HCL 25 MG PO TABS
25.0000 mg | ORAL_TABLET | Freq: Once | ORAL | Status: AC
  Administered 2022-02-24: 25 mg via ORAL
  Filled 2022-02-24: qty 1

## 2022-02-24 MED ORDER — ASPIRIN 81 MG PO CHEW
324.0000 mg | CHEWABLE_TABLET | Freq: Once | ORAL | Status: AC
  Administered 2022-02-24: 324 mg via ORAL
  Filled 2022-02-24: qty 4

## 2022-02-24 NOTE — ED Provider Notes (Signed)
[facility] Emergency Department Provider Note MRN:  [id number]  Arrival date & time: 02/24/22     Chief Complaint   Chest Pain   History of Present Illness   [name] is a 36 y.o. year-old male presents to the ED with chief complaint of chest pain.  Onset 11pm.  Reports associated arm numbness that started about the same time and states that his arm feels a little weak.  He denies SOB or nausea or vomiting.  Hx of stent and prior MI 2 years ago.  Takes Aspirin and Plavix.  History provided by patient.   Review of Systems  Pertinent positive and negative review of systems noted in HPI.    Physical Exam   Vitals:   02/24/22 0520  BP: (!) 141/80  Pulse: 76  Resp: 15  Temp: 98.9 F (37.2 C)  SpO2: 97%    CONSTITUTIONAL:  well-appearing, NAD NEURO:  Alert and oriented x 3, CN 3-12 grossly intact EYES:  eyes equal and reactive ENT/NECK:  Supple, no stridor  CARDIO:  normal rate, regular rhythm, appears well-perfused  PULM:  No respiratory distress, CTAB GI/GU:  non-distended, non tender MSK/SPINE:  No gross deformities, no edema, moves all extremities  SKIN:  no rash, atraumatic   *Additional and/or pertinent findings included in MDM below  Diagnostic and Interventional Summary    EKG Interpretation  Date/Time:  Wednesday February 24 2022 05:17:01 EDT Ventricular Rate:  76 PR Interval:  154 QRS Duration: 105 QT Interval:  411 QTC Calculation: 463 R Axis:   252 Text Interpretation: Sinus rhythm Probable left atrial enlargement Left anterior fascicular block Anterior infarct, old No significant change since last tracing Confirmed by Orpah Greek [id number]) on 02/24/2022 5:25:26 AM       Labs Reviewed  RESP PANEL BY RT-PCR (FLU A&B, COVID) ARPGX2  CBC  ETHANOL  PROTIME-INR  APTT  DIFFERENTIAL  COMPREHENSIVE METABOLIC PANEL  RAPID URINE DRUG SCREEN, HOSP PERFORMED  URINALYSIS, ROUTINE W REFLEX MICROSCOPIC  I-STAT  CHEM 8, ED  TROPONIN I (HIGH SENSITIVITY)    DG Chest Portable 1 View    (Results Pending)  CT HEAD WO CONTRAST    (Results Pending)    Medications  aspirin chewable tablet 324 mg (has no administration in time range)     Procedures  /  Critical Care Procedures  ED Course and Medical Decision Making  I have reviewed the triage vital signs, the nursing notes, and pertinent available records from the EMR.  Social Determinants Affecting Complexity of Care: Patient has no clinically significant social determinants affecting this chief complaint..   ED Course:    Medical Decision Making Patient here with chest pain.  Onset 11pm.  Reports arm numbness at weakness that started the same time.  Outside of code stroke window.  These symptoms are subjective as well, I don't note any weakness on my exam and patient has normal sensation on exam.  Will check CT head.  Will give aspirin.    Amount and/or Complexity of Data Reviewed Labs: ordered. Radiology: ordered.  Risk OTC drugs.     Consultants:    Treatment and Plan: Signed out at shift change to oncoming team.  Labs and CT still pending.  Would suggest cards consult unless symptoms resolve completely and 2 negative troponins.    Final Clinical Impressions(s) / ED Diagnoses  No diagnosis found.  ED Discharge Orders     None  Discharge Instructions Discussed with and Provided to Patient:   Discharge Instructions   None      Montine Circle, PA-C 02/24/22 0615    Orpah Greek, MD 02/25/22 0300

## 2022-02-24 NOTE — ED Notes (Signed)
Pt back in room.

## 2022-02-24 NOTE — ED Notes (Signed)
Patient transported to CT 

## 2022-02-24 NOTE — Consult Note (Addendum)
Cardiology Consultation   Patient ID: [name] MRN: [id number]; DOB: 1985/08/27  Admit date: 02/24/2022 Date of Consult: 02/24/2022  PCP:  [name], [place] Prov[facility] Cardiologist:  Larae Grooms, MD        Patient Profile:   [name] is a 36 y.o. male with a hx of HTN, Ischemic cardiomyopathy EF 35-40%, CAD s/p stent placemenet in LAD in 2021. who is being seen 02/24/2022 for the evaluation of chest pressure at the request of emergency medicine.  History of Present Illness:   [name] is a 36 year old male with past medical history of HTN, Ischemic Cardiomyopathy EF 35-40%, CAD s/p stent in LAD in 2021. Around 7PM last night pt started to feel chest pressure and L sided numbness. He states the pressure was 3/10. He reports no dyspnea, nausea, vomiting, or fevers. He states he is compliant with his medications. Chest pressure was resolved with aspirin given in the emergency department. Numbness persisted longer, however on exam patient states symptoms have resolved. L sided paresthesia is a new symptom. He states this has more mild than his previous admissions and visits for chest pain. He presented in August with chest pain, dizziness, and nausea, however left before evaluation could take place. He was admitted in March of this year for hypertensive urgency.   Last cardiology appointment was in April of this year in which a Myoview stress test was ordered, but never followed up on.  His last echocardiogram was in 2021 which revealed an EF 35-40%, with apical akinesis, severe hypokinesis of inferoseptum and apical anteroseptum, with LV Regional wall abnormalities. He was started on DUAPT with aspirin and plavix.      Past Medical History:  Diagnosis Date   Coronary artery disease    Diabetes mellitus without complication ([place])    Hypercholesteremia    Hypertension    Myocardial infarction ([place]) 01/2020    Past Surgical History:   Procedure Laterality Date   CORONARY STENT INTERVENTION N/A 01/28/2020   Procedure: CORONARY STENT INTERVENTION;  Surgeon: [name], MD;  Location: [facility];  Service: Cardiovascular;  Laterality: N/A;   CORONARY/GRAFT ACUTE MI REVASCULARIZATION N/A 01/28/2020   Procedure: Coronary/Graft Acute MI Revascularization;  Surgeon: [name], MD;  Location: [facility];  Service: Cardiovascular;  Laterality: N/A;   LEFT HEART CATH AND CORONARY ANGIOGRAPHY N/A 01/28/2020   Procedure: LEFT HEART CATH AND CORONARY ANGIOGRAPHY;  Surgeon: [name], MD;  Location: [facility];  Service: Cardiovascular;  Laterality: N/A;     Inpatient Medications: Scheduled Meds:  Continuous Infusions:  PRN Meds:   Allergies:   No Known Allergies  Social History:   Social History   Socioeconomic History   Marital status: Married    Spouse name: Not on file   Number of children: Not on file   Years of education: Not on file   Highest education level: Not on file  Occupational History   Not on file  Tobacco Use   Smoking status: Never   Smokeless tobacco: Never  Vaping Use   Vaping Use: Never used  Substance and Sexual Activity   Alcohol use: No   Drug use: No   Sexual activity: Not on file  Other Topics Concern   Not on file  Social History Narrative   Not on file   Social Determinants of Health   Financial Resource Strain: Not on file  Food  Insecurity: Not on file  Transportation Needs: Not on file  Physical Activity: Not on file  Stress: Not on file  Social Connections: Not on file  Intimate Partner Violence: Not on file    Family History:   Family History  Problem Relation Age of Onset   Diabetes Father    Heart disease Father      ROS:  Please see the history of present illness.  All other ROS reviewed and negative.     Physical Exam/Data:   Vitals:   02/24/22 0830 02/24/22 0900 02/24/22 0930 02/24/22 1000  BP: (!)  183/106 (!) 174/92 (!) 176/95 (!) 169/102  Pulse: 72 71 74 78  Resp: 10 12 12 11   Temp:      TempSrc:      SpO2: 99% 97% 91% 100%  Weight:      Height:       No intake or output data in the 24 hours ending 02/24/22 1011    02/24/2022    5:18 AM 12/09/2021    8:50 PM 10/08/2021    9:30 AM  Last 3 Weights  Weight (lbs) 300 lb 317 lb 3.9 Greg 307 lb  Weight (kg) 136.079 kg 143.9 kg 139.254 kg     Body mass index is 36.52 kg/m.  General:  Obese male, in no acute distress HEENT: normal Neck: no JVD Vascular: No carotid bruits; Distal pulses 2+ bilaterally Cardiac:  normal S1, S2; RRR; no murmurs Lungs:  clear to auscultation bilaterally, no wheezing, rhonchi or rales  Abd: soft, nontender, no hepatomegaly  Ext: no edema Musculoskeletal:  No deformities, BUE and BLE strength normal and equal Skin: warm and dry  Neuro:  CNs 2-12 intact, no focal abnormalities noted Psych:  Normal affect   EKG:  The EKG was personally reviewed and demonstrates:  Sinus rhythm w/ Left Anterior fascicular block Telemetry:  Telemetry was personally reviewed and demonstrates:  Sinus rhythm  Relevant CV Studies: Echo 03/2020  1. Apical akinesis. Severe hypokinesis of the inferoseptum and apical  anteroseptum. Left ventricular ejection fraction, by estimation, is 35 to  40%. The left ventricle has moderately decreased function. The left  ventricle demonstrates regional wall  motion abnormalities (see scoring diagram/findings for description). There  is mild left ventricular hypertrophy of the posterior segment. Left  ventricular diastolic parameters are consistent with Grade I diastolic  dysfunction (impaired relaxation).   2. Right ventricular systolic function is normal. The right ventricular  size is normal.   3. The mitral valve is normal in structure. Trivial mitral valve  regurgitation. No evidence of mitral stenosis.   4. The aortic valve is tricuspid. Aortic valve regurgitation is not   visualized. No aortic stenosis is present.   5. The inferior vena cava is normal in size with greater than 50%  respiratory variability, suggesting right atrial pressure of 3 mmHg.   Comparison(s): 01/30/20 EF 40-45%.    Coronary/Graft Acute MI Revascularization  01/2020  CORONARY STENT INTERVENTION  LEFT HEART CATH AND CORONARY ANGIOGRAPHY    Conclusion     Prox LAD lesion is 100% stenosed. Late presenting anterior MI, as his symptoms have been going on for about 24 hours. A drug-eluting stent was successfully placed using a SYNERGY XD 2.50X24, postdilated to greater than 3 mm in diameter. Post intervention, there is a 0% residual stenosis. There is moderate to severe left ventricular systolic dysfunction with anterior wall hypokinesis. LV end diastolic pressure is mildly elevated. The left ventricular ejection fraction is 25-35%  by visual estimate. There is no aortic valve stenosis.   Watch for 48 hours.  Check echocardiogram.  May need to consider LifeVest.   Holding lisinopril at this time since his creatinine was elevated in the Cath Lab at the time of procedure.   1 year of dual antiplatelet therapy recommended along with aggressive secondary prevention.   Diagnostic Dominance: Co-dominant Intervention      Laboratory Data:  High Sensitivity Troponin:   Recent Labs  Lab 02/24/22 0530 02/24/22 0741  TROPONINIHS 14 14     Chemistry Recent Labs  Lab 02/24/22 0530 02/24/22 0543  NA 142 142  K 3.5 3.5  CL 112* 110  CO2 21*  --   GLUCOSE 124* 123*  BUN 45* 41*  CREATININE 3.57* 3.80*  CALCIUM 9.1  --   GFRNONAA 21*  --   ANIONGAP 9  --     Recent Labs  Lab 02/24/22 0530  PROT 6.7  ALBUMIN 3.5  AST 21  ALT 23  ALKPHOS 82  BILITOT 0.8   Lipids No results for input(s): "CHOL", "TRIG", "HDL", "LABVLDL", "LDLCALC", "CHOLHDL" in the last 168 hours.  Hematology Recent Labs  Lab 02/24/22 0530 02/24/22 0543  WBC 7.8  --   RBC 3.78*  --   HGB 11.0*  10.5*  HCT 33.3* 31.0*  MCV 88.1  --   MCH 29.1  --   MCHC 33.0  --   RDW 13.4  --   PLT 178  --    Thyroid No results for input(s): "TSH", "FREET4" in the last 168 hours.  BNPNo results for input(s): "BNP", "PROBNP" in the last 168 hours.  DDimer No results for input(s): "DDIMER" in the last 168 hours.   Radiology/Studies:  CT HEAD WO CONTRAST  Result Date: 02/24/2022 CLINICAL DATA:  36 year old male with neurologic deficit. EXAM: CT HEAD WITHOUT CONTRAST TECHNIQUE: Contiguous axial images were obtained from the base of the skull through the vertex without intravenous contrast. RADIATION DOSE REDUCTION: This exam was performed according to the departmental dose-optimization program which includes automated exposure control, adjustment of the mA and/or kV according to patient size and/or use of iterative reconstruction technique. COMPARISON:  Head CT 08/29/2021. FINDINGS: Brain: Cerebral volume is stable and normal. No midline shift, ventriculomegaly, mass effect, evidence of mass lesion, intracranial hemorrhage or evidence of cortically based acute infarction. Gray-white matter differentiation is within normal limits throughout the brain. Vascular: No suspicious intracranial vascular hyperdensity. Skull: No acute osseous abnormality identified. Sinuses/Orbits: Left sphenoid and other smaller scattered mucous retention cysts are stable since March. No sinus fluid levels. Tympanic cavities and mastoids appear clear. Other: Visualized orbits and scalp soft tissues are within normal limits. IMPRESSION: Stable and normal noncontrast CT appearance of the brain. Electronically Signed   By: Genevie Ann M.D.   On: 02/24/2022 07:25   DG Chest Portable 1 View  Result Date: 02/24/2022 CLINICAL DATA:  Chest pain EXAM: PORTABLE CHEST 1 VIEW COMPARISON:  01/14/2022 FINDINGS: The lungs are clear without focal pneumonia, edema, pneumothorax or pleural effusion. The cardiopericardial silhouette is within normal  limits for size. Telemetry leads overlie the chest. IMPRESSION: No active disease. Electronically Signed   By: Misty Stanley M.D.   On: 02/24/2022 06:03     Assessment and Plan:   #Chest Tightness Pt has been complaining of a 10 hours history of chest tightness, associated with L upper and lower extremity of numbness. He stated his symptoms have been relatively mild compared to previous MI presentation.  He denies any dyspnea, nausea, fever, and chills. Unlikely STEMI/NSTEMI due to EKG tracing unchanging from previous, and normal troponin levels. Pt is asymptomatic as well.   Plan:  - Follow up in outpatient setting  #Hypertension in the setting of CKD  Pt's blood pressure has been elevated since emergency department presentation. On exam, blood pressure was 176/94, and then jumped to 197/80. He is asymptomatic, and has a history of CKD Stage 4, most recent creatinine being 3.8. He does not check or log his blood pressure at home. He takes Hydralazine 25mg  TID, Isordril 20mg , Coreg 25, and Chlorthalidone 25mg  at home. He has a history of hypertensive emergency episodes as well.   Plan:  -Follow up in outpatient setting -Recommend increasing hydralazine to 50mg  TID        For questions or updates, please contact [place] Please consult [email or web address] for contact info under    Signed, Drucie Opitz, MD  02/24/2022 10:11 AM   I have personally seen and examined this patient. I agree with the assessment and plan as outlined above.  36 yo male with h/o morbid obesity, ischemic cardiomyopathy, CAD, HTN, CKD stage IV who presented to the ED with c/o mild chest pain. The pain is not typical of his angina. His BP has been elevated at home and is high as 357 systolic here in the ED. Chest pain has resolved. Troponin negative x 2. EKG without ischemic changes.  My exam: morbidly obese male in NAD. CV:RRR, no murmurs. Lungs: clear bilat.  Plan: Atypical chest pain: No objective  evidence of ischemia. Chest pain has resolved. His chest pain may have been driven by elevated blood pressures. I will have him increase his hydralazine to 50 mg po TID and continue other medications at current dosage. He will need to speak to Nephrology about further adjustment of his BP medications. I will ask our office to send in the higher dosage of Hydralazine to [facility].  OK to discharge home from the ED. We will plan f/u in our office over the next few weeks.   Lauree Chandler, MD, [facility] 02/24/2022 11:24 AM

## 2022-02-24 NOTE — ED Provider Notes (Signed)
Patient care taken over from previous provider, see their note for additional HPI.  To briefly summarize patient presents due to chest pain which started last night.  Associated with left arm numbness which has been constant.  The pain feels like pressure, does not radiate.  No nausea, vomiting or shortness of breath.  Denies any unilateral weakness or numbness, no headache, vision changes.  History of previous MI, on Plavix and aspirin.  Given left arm no CT head was ordered, low suspicion for TIA or CVA/in the context of associated chest pain.  Plan is for second troponin, cardiology consult to evaluate the patient given recent MI 2 years ago in a patient so young.  Physical Exam  BP (!) 159/89 (BP Location: Left Arm)   Pulse 77   Temp 98.4 F (36.9 C) (Oral)   Resp 18   Ht 6\' 4"  (1.93 m)   Wt 136.1 kg   SpO2 100%   BMI 36.52 kg/m   Physical Exam Vitals and nursing note reviewed. Exam conducted with a chaperone present.  Constitutional:      Appearance: Normal appearance.  HENT:     Head: Normocephalic and atraumatic.  Eyes:     General: No scleral icterus.       Right eye: No discharge.        Left eye: No discharge.     Extraocular Movements: Extraocular movements intact.     Pupils: Pupils are equal, round, and reactive to light.  Cardiovascular:     Rate and Rhythm: Normal rate and regular rhythm.     Pulses: Normal pulses.     Heart sounds: Normal heart sounds. No murmur heard.    No friction rub. No gallop.  Pulmonary:     Effort: Pulmonary effort is normal. No respiratory distress.     Breath sounds: Normal breath sounds.  Abdominal:     General: Abdomen is flat. Bowel sounds are normal. There is no distension.     Palpations: Abdomen is soft.     Tenderness: There is no abdominal tenderness.  Skin:    General: Skin is warm and dry.     Coloration: Skin is not jaundiced.  Neurological:     Mental Status: He is alert. Mental status is at baseline.     GCS: GCS eye  subscore is 4. GCS verbal subscore is 5. GCS motor subscore is 6.     Cranial Nerves: Cranial nerves 2-12 are intact.     Motor: Motor function is intact. No weakness, tremor or pronator drift.     Coordination: Coordination normal. Finger-Nose-Finger Test normal.     Procedures  Procedures  ED Course / MDM    Medical Decision Making Amount and/or Complexity of Data Reviewed Labs: ordered. Radiology: ordered.  Risk OTC drugs. Prescription drug management.   On my evaluation patient states the chest pain has improved but not fully resolved.  He is still having the left arm numbness, but has no objective weakness or sensory deficit.  No focal deficit on my exam.  His grip strength is symmetric, he is upper and lower extremity pulses which are symmetric.  Troponin 14 --> 14. EKG is roughly unchanged compared to previous.  CT head ordered by previous PA, viewed by myself.  Agree with radiologist interpretation. No acute process.   I have consulted with cardiology.  They will evaluate the patient in the ED.  Spoke Dr. Angelena Form with cardiology service.  Patient's pain is fully resolved, patient is hypertensive  at 187/103 in the room so he is going to have the patient double the hydralazine to 50 mg 3 times daily.  From a cardiology perspective patient is appropriate for close outpatient follow-up and does not need any additional work-up or admission.  Appreciate cardiology service consult for taking care of this patient.  Doubt TIA or CVA based on no focal deficits and negative CT head.  Remaining laboratory work-up is overall reassuring.  Patient has elevated creatinine but this is roughly at his baseline, no signs of hypertensive emergency on exam or in work-up.  At this time feel patient is stable and appropriate for close outpatient follow-up.     Sherrill Raring, PA-C 02/24/22 1527    Valarie Merino, MD 03/02/22 0002

## 2022-02-24 NOTE — Discharge Instructions (Signed)
You are seen today in the emergency department for chest pain.  The CT scan of your head was very reassuring, additionally your cardiac work-up was negative.  Cardiology would like you to take 50 mg of hydralazine 3 times daily instead of 25.  Continue taking the rest of your home medicine and follow-up in the office with cardiology as planned.  Return to ED for loss of consciousness, severe unrelenting pain or new symptoms.

## 2022-02-24 NOTE — ED Notes (Signed)
Transported to CT 

## 2022-02-24 NOTE — ED Notes (Signed)
Aware of need for urine sample, urinal at bedside 

## 2022-02-24 NOTE — ED Triage Notes (Signed)
Patient here with chest pain that started last night after getting home from work.  States that his left arm is completely numb.  Patient has not been able to sleep due to the discomfort.  Denies any shortness of breath, nausea or vomiting.  Patient does have PMH of stent placement.

## 2022-02-25 ENCOUNTER — Other Ambulatory Visit: Payer: Self-pay | Admitting: *Deleted

## 2022-02-25 MED ORDER — HYDRALAZINE HCL 50 MG PO TABS: 50.0000 mg | ORAL_TABLET | Freq: Three times a day (TID) | ORAL | 1 refills | Status: DC

## 2022-03-04 ENCOUNTER — Encounter: Payer: Self-pay | Admitting: Interventional Cardiology

## 2022-03-04 ENCOUNTER — Telehealth: Payer: Self-pay | Admitting: Interventional Cardiology

## 2022-03-04 NOTE — Telephone Encounter (Signed)
Thompson Grayer, RN  [facility] Scheduling Cc: Thompson Grayer, RN   Patient was recently seen in the ED. He will also need a follow up appointment with  Dr Irish Lack  or office APP in the next few weeks.  Can you call him to schedule this appointment?  Thanks,  Pat    LVM x3 with no success in scheduling, will send letter to have patient contact office

## 2022-04-06 ENCOUNTER — Other Ambulatory Visit: Payer: Self-pay | Admitting: Physician Assistant

## 2022-06-04 ENCOUNTER — Other Ambulatory Visit: Payer: Self-pay | Admitting: Interventional Cardiology

## 2022-06-22 DIAGNOSIS — I2510 Atherosclerotic heart disease of native coronary artery without angina pectoris: Secondary | ICD-10-CM | POA: Diagnosis not present

## 2022-06-22 DIAGNOSIS — N184 Chronic kidney disease, stage 4 (severe): Secondary | ICD-10-CM | POA: Diagnosis not present

## 2022-06-22 DIAGNOSIS — R319 Hematuria, unspecified: Secondary | ICD-10-CM | POA: Diagnosis not present

## 2022-06-22 DIAGNOSIS — I129 Hypertensive chronic kidney disease with stage 1 through stage 4 chronic kidney disease, or unspecified chronic kidney disease: Secondary | ICD-10-CM | POA: Diagnosis not present

## 2022-06-22 DIAGNOSIS — E1122 Type 2 diabetes mellitus with diabetic chronic kidney disease: Secondary | ICD-10-CM | POA: Diagnosis not present

## 2022-06-22 DIAGNOSIS — I5020 Unspecified systolic (congestive) heart failure: Secondary | ICD-10-CM | POA: Diagnosis not present

## 2022-07-06 DIAGNOSIS — I5020 Unspecified systolic (congestive) heart failure: Secondary | ICD-10-CM | POA: Diagnosis not present

## 2022-07-06 DIAGNOSIS — E1122 Type 2 diabetes mellitus with diabetic chronic kidney disease: Secondary | ICD-10-CM | POA: Diagnosis not present

## 2022-07-06 DIAGNOSIS — R319 Hematuria, unspecified: Secondary | ICD-10-CM | POA: Diagnosis not present

## 2022-07-06 DIAGNOSIS — N184 Chronic kidney disease, stage 4 (severe): Secondary | ICD-10-CM | POA: Diagnosis not present

## 2022-07-06 DIAGNOSIS — I129 Hypertensive chronic kidney disease with stage 1 through stage 4 chronic kidney disease, or unspecified chronic kidney disease: Secondary | ICD-10-CM | POA: Diagnosis not present

## 2022-07-06 DIAGNOSIS — I2510 Atherosclerotic heart disease of native coronary artery without angina pectoris: Secondary | ICD-10-CM | POA: Diagnosis not present

## 2022-07-07 ENCOUNTER — Ambulatory Visit: Payer: 59 | Admitting: Nurse Practitioner

## 2022-07-12 NOTE — Progress Notes (Deleted)
Cardiology Office Note:    Date:  07/12/2022   ID:  [name], [name] 1985/11/16, MRN [id number]  PCP:  [name], [place] [facility] Cardiologist:  Larae Grooms, MD { Click to update primary MD,subspecialty MD or APP then REFRESH:1}    Referring MD: [name], FNP   No chief complaint on file.   History of Present Illness:    [name] is a 37 y.o. male with a hx of CAD (s/p PCI with DES to LAD 2021), HFrEF, HTN, IDDM, HLD, CKD IV, and obesity.   Was evaluated in our office on 10/08/21 by Greg Kail PA, at that time he was having intermittent chest pain, Myoview was ordered to rule out ischemia vs deconditioning, but ultimately the patient did not complete exam.   He presented to the ED on 12/09/21 for chest pain, troponins were negative, CXR normal, ultimately d/c'd home.   He presented to the ED on 01/18/22 for chest pain, troponin 11, patient eventually left before being seen.   He presented to the ED on 02/24/22 with chest pain, left arm numbness. Troponin 14>14. EKG unchanged. Head CT unremarkable. Dr. Angelena Form was consulted, pain had fully resolved but his BP was 187/103, hydralazine was increased to 50 mg TID and was ultimately d/c'd home.   He presents today   lipids  Past Medical History:  Diagnosis Date   Coronary artery disease    Diabetes mellitus without complication ([place])    Hypercholesteremia    Hypertension    Myocardial infarction ([place]) 01/2020    Past Surgical History:  Procedure Laterality Date   CORONARY STENT INTERVENTION N/A 01/28/2020   Procedure: CORONARY STENT INTERVENTION;  Surgeon: [name], MD;  Location: [facility];  Service: Cardiovascular;  Laterality: N/A;   CORONARY/GRAFT ACUTE MI REVASCULARIZATION N/A 01/28/2020   Procedure: Coronary/Graft Acute MI Revascularization;  Surgeon: [name], MD;  Location: [facility];  Service: Cardiovascular;   Laterality: N/A;   LEFT HEART CATH AND CORONARY ANGIOGRAPHY N/A 01/28/2020   Procedure: LEFT HEART CATH AND CORONARY ANGIOGRAPHY;  Surgeon: [name], MD;  Location: [facility];  Service: Cardiovascular;  Laterality: N/A;    Current Medications: No outpatient medications have been marked as taking for the 07/13/22 encounter (Appointment) with Richardson Dopp T, PA-C.     Allergies:   Patient has no known allergies.   Social History   Socioeconomic History   Marital status: Married    Spouse name: Not on file   Number of children: Not on file   Years of education: Not on file   Highest education level: Not on file  Occupational History   Not on file  Tobacco Use   Smoking status: Never   Smokeless tobacco: Never  Vaping Use   Vaping Use: Never used  Substance and Sexual Activity   Alcohol use: No   Drug use: No   Sexual activity: Not on file  Other Topics Concern   Not on file  Social History Narrative   Not on file   Social Determinants of Health   Financial Resource Strain: Not on file  Food Insecurity: Not on file  Transportation Needs: Not on file  Physical Activity: Not on file  Stress: Not on file  Social Connections: Not on file     Family History: The patient's family history includes Diabetes in his father; Heart disease in his father.  ROS:  Please see the history of present illness.     All other systems reviewed and are negative.  EKGs/Labs/Other Studies Reviewed:    The following studies were reviewed today:  04/01/20 echo complete - apical akinesis, severe hypokinesis of the inferoseptum and apical anteroseptum. EF 35-40%, +RWMA, mild LVH of posterior segment, grade I DD. Trivial MR.   01/28/2020 cardiac catheterization Prox LAD lesion is 100% stenosed. Late presenting anterior MI, as his symptoms have been going on for about 24 hours. A drug-eluting stent was successfully placed using a SYNERGY XD [id number], postdilated to greater  than 3 mm in diameter. Post intervention, there is a 0% residual stenosis. There is moderate to severe left ventricular systolic dysfunction with anterior wall hypokinesis. LV end diastolic pressure is mildly elevated. The left ventricular ejection fraction is 25-35% by visual estimate. There is no aortic valve stenosis.     EKG:  EKG is *** ordered today.  The ekg ordered today demonstrates ***  Recent Labs: 08/28/2021: B Natriuretic Peptide 130.5 02/24/2022: ALT 23; BUN 41; Creatinine, Ser 3.80; Hemoglobin 10.5; Platelets 178; Potassium 3.5; Sodium 142  Recent Lipid Panel    Component Value Date/Time   CHOL 131 04/01/2020 0956   TRIG 242 (H) 04/01/2020 0956   HDL 24 (L) 04/01/2020 0956   CHOLHDL 5.5 (H) 04/01/2020 0956   CHOLHDL 6.4 01/29/2020 0731   VLDL 69 (H) 01/29/2020 0731   LDLCALC 67 04/01/2020 0956   LDLDIRECT 118.2 (H) 01/28/2020 1242     Risk Assessment/Calculations:   {Does this patient have ATRIAL FIBRILLATION?:[id number]}  No BP recorded.  {Refresh Note OR Click here to enter BP  :1}***         Physical Exam:    VS:  There were no vitals taken for this visit.    Wt Readings from Last 3 Encounters:  02/24/22 300 lb (136.1 kg)  12/09/21 (!) 317 lb 3.9 oz (143.9 kg)  10/08/21 (!) 307 lb (139.3 kg)     GEN: *** Well nourished, well developed in no acute distress HEENT: Normal NECK: No JVD; No carotid bruits LYMPHATICS: No lymphadenopathy CARDIAC: ***RRR, no murmurs, rubs, gallops RESPIRATORY:  Clear to auscultation without rales, wheezing or rhonchi  ABDOMEN: Soft, non-tender, non-distended MUSCULOSKELETAL:  No edema; No deformity  SKIN: Warm and dry NEUROLOGIC:  Alert and oriented x 3 PSYCHIATRIC:  Normal affect   ASSESSMENT:    1. Coronary artery disease involving native coronary artery of native heart, unspecified whether angina present   2. HFrEF (heart failure with reduced ejection fraction) ([place])   3. Essential hypertension   4. CKD  (chronic kidney disease) stage 4, GFR 15-29 ml/min (HCC)   5. Dyslipidemia    PLAN:    In order of problems listed above:  CAD - Denies angina or acute decompensation. GDMT of ASA, coreg, NTG (prn, has not needed. Isosorbide dinitrate and atorvastatin.  HFrEF - GDMT Jardiance and coreg. CKD prohibits further addition of GDMT.  HTN - BP today  , continue with current antihypertensive regimen Dyslipidemia - LDL 2021 67, however triglycerides 242. Repeat FLP and liver function test.  CKD - managed by Dr. Cammy Copa you ordering a CV Procedure (e.g. stress test, cath, DCCV, TEE, etc)?   Press F2        :[id number]    Medication Adjustments/Labs and Tests Ordered: Current medicines are reviewed at length with the patient today.  Concerns regarding medicines are outlined above.  No orders of the  defined types were placed in this encounter.  No orders of the defined types were placed in this encounter.   There are no Patient Instructions on file for this visit.   Signed, Trudi Ida, NP  07/12/2022 12:19 PM    [place]

## 2022-07-13 ENCOUNTER — Ambulatory Visit: Payer: 59 | Admitting: Physician Assistant

## 2022-07-13 DIAGNOSIS — E785 Hyperlipidemia, unspecified: Secondary | ICD-10-CM

## 2022-07-13 DIAGNOSIS — I10 Essential (primary) hypertension: Secondary | ICD-10-CM

## 2022-07-13 DIAGNOSIS — I2510 Atherosclerotic heart disease of native coronary artery without angina pectoris: Secondary | ICD-10-CM

## 2022-07-13 DIAGNOSIS — N184 Chronic kidney disease, stage 4 (severe): Secondary | ICD-10-CM

## 2022-07-13 DIAGNOSIS — I5020 Unspecified systolic (congestive) heart failure: Secondary | ICD-10-CM

## 2022-07-27 ENCOUNTER — Other Ambulatory Visit: Payer: Self-pay | Admitting: Interventional Cardiology

## 2022-07-28 DIAGNOSIS — I129 Hypertensive chronic kidney disease with stage 1 through stage 4 chronic kidney disease, or unspecified chronic kidney disease: Secondary | ICD-10-CM | POA: Diagnosis not present

## 2022-07-28 DIAGNOSIS — N184 Chronic kidney disease, stage 4 (severe): Secondary | ICD-10-CM | POA: Diagnosis not present

## 2022-07-28 DIAGNOSIS — I5020 Unspecified systolic (congestive) heart failure: Secondary | ICD-10-CM | POA: Diagnosis not present

## 2022-07-31 ENCOUNTER — Other Ambulatory Visit: Payer: Self-pay | Admitting: Interventional Cardiology

## 2022-08-15 IMAGING — DX DG CHEST 2V
2 series · 2 of 2 positions shown · non-contrast
Comparison: 08/28/2021

CLINICAL DATA: Chest pain for 2 days

EXAM:
CHEST - 2 VIEW

[chest pa]
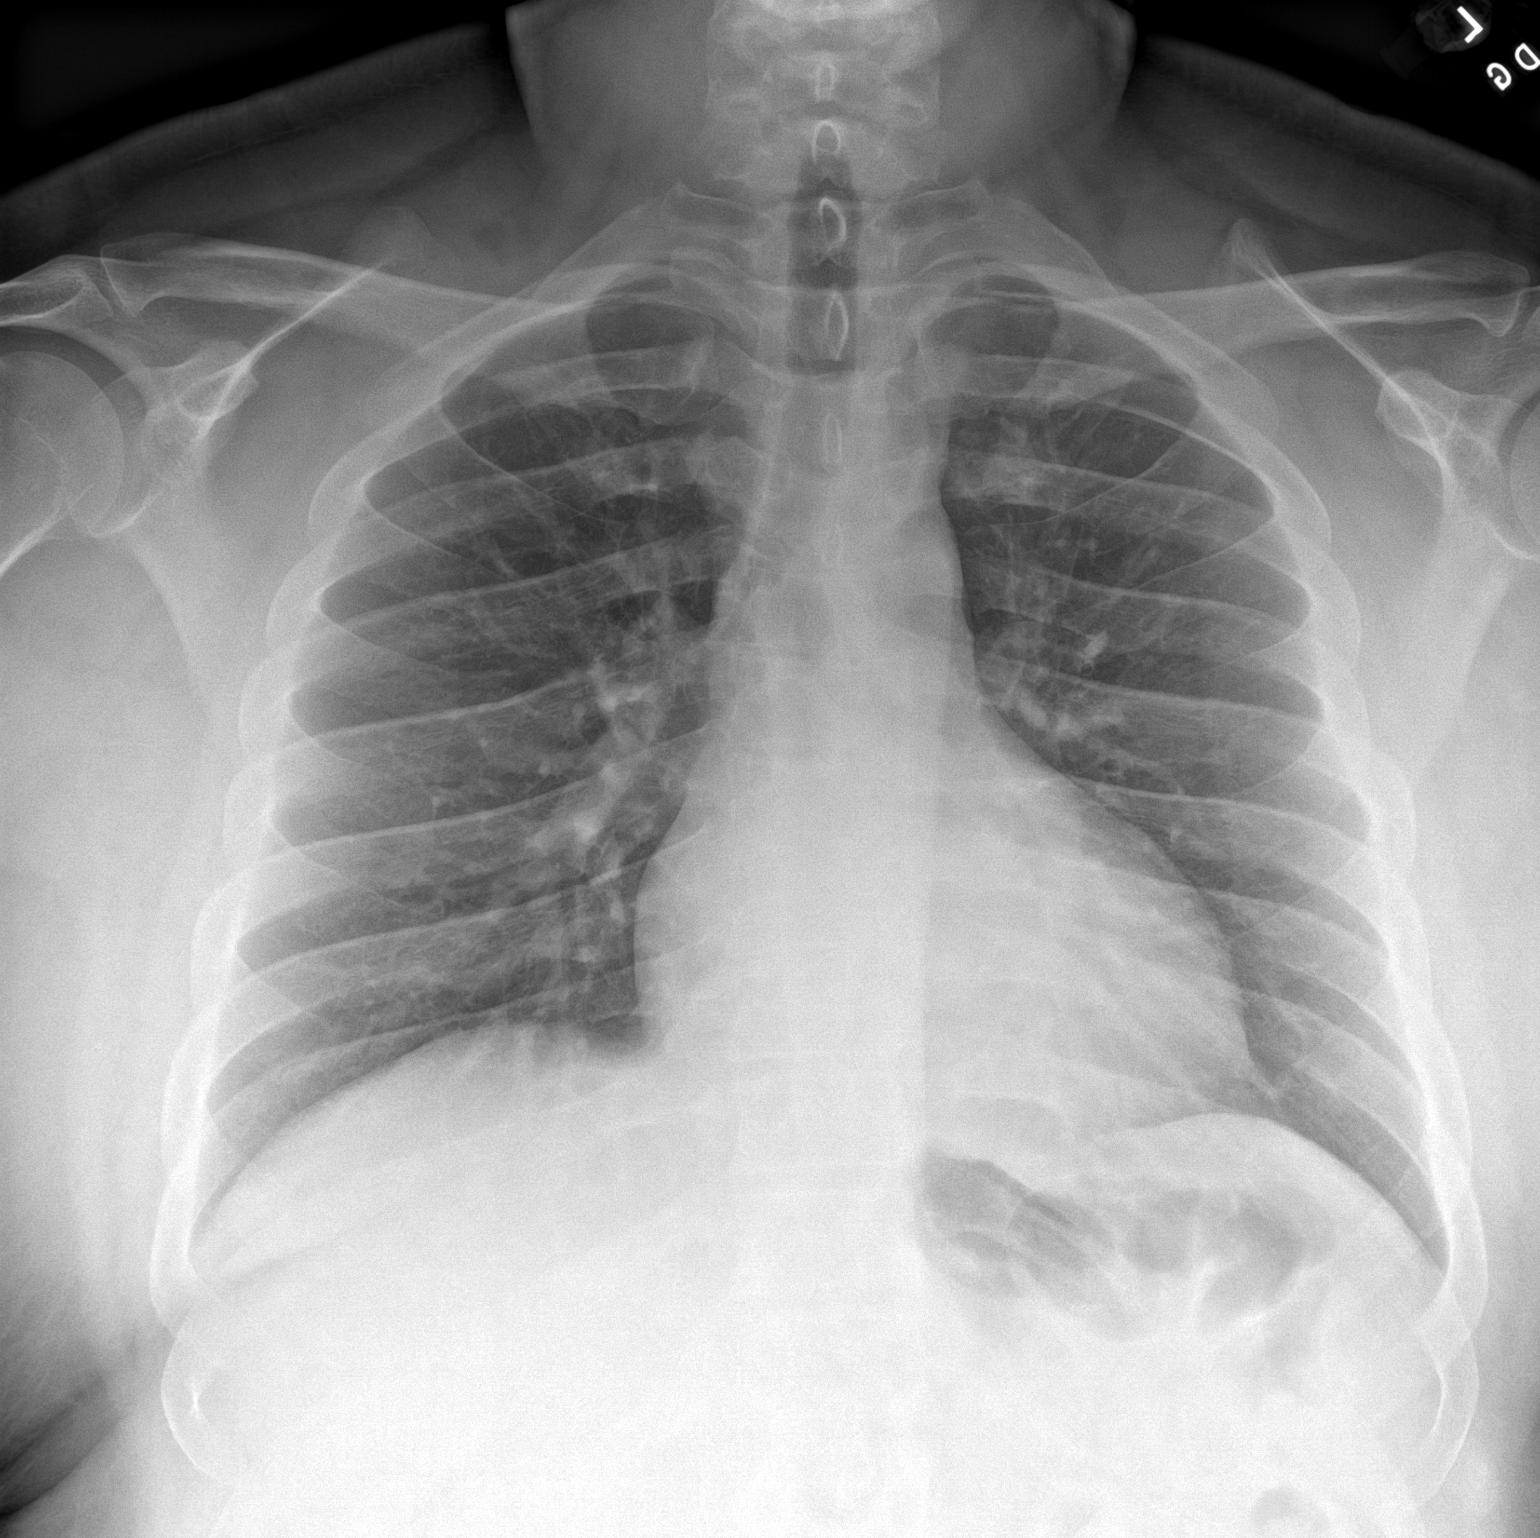

[chest lat]
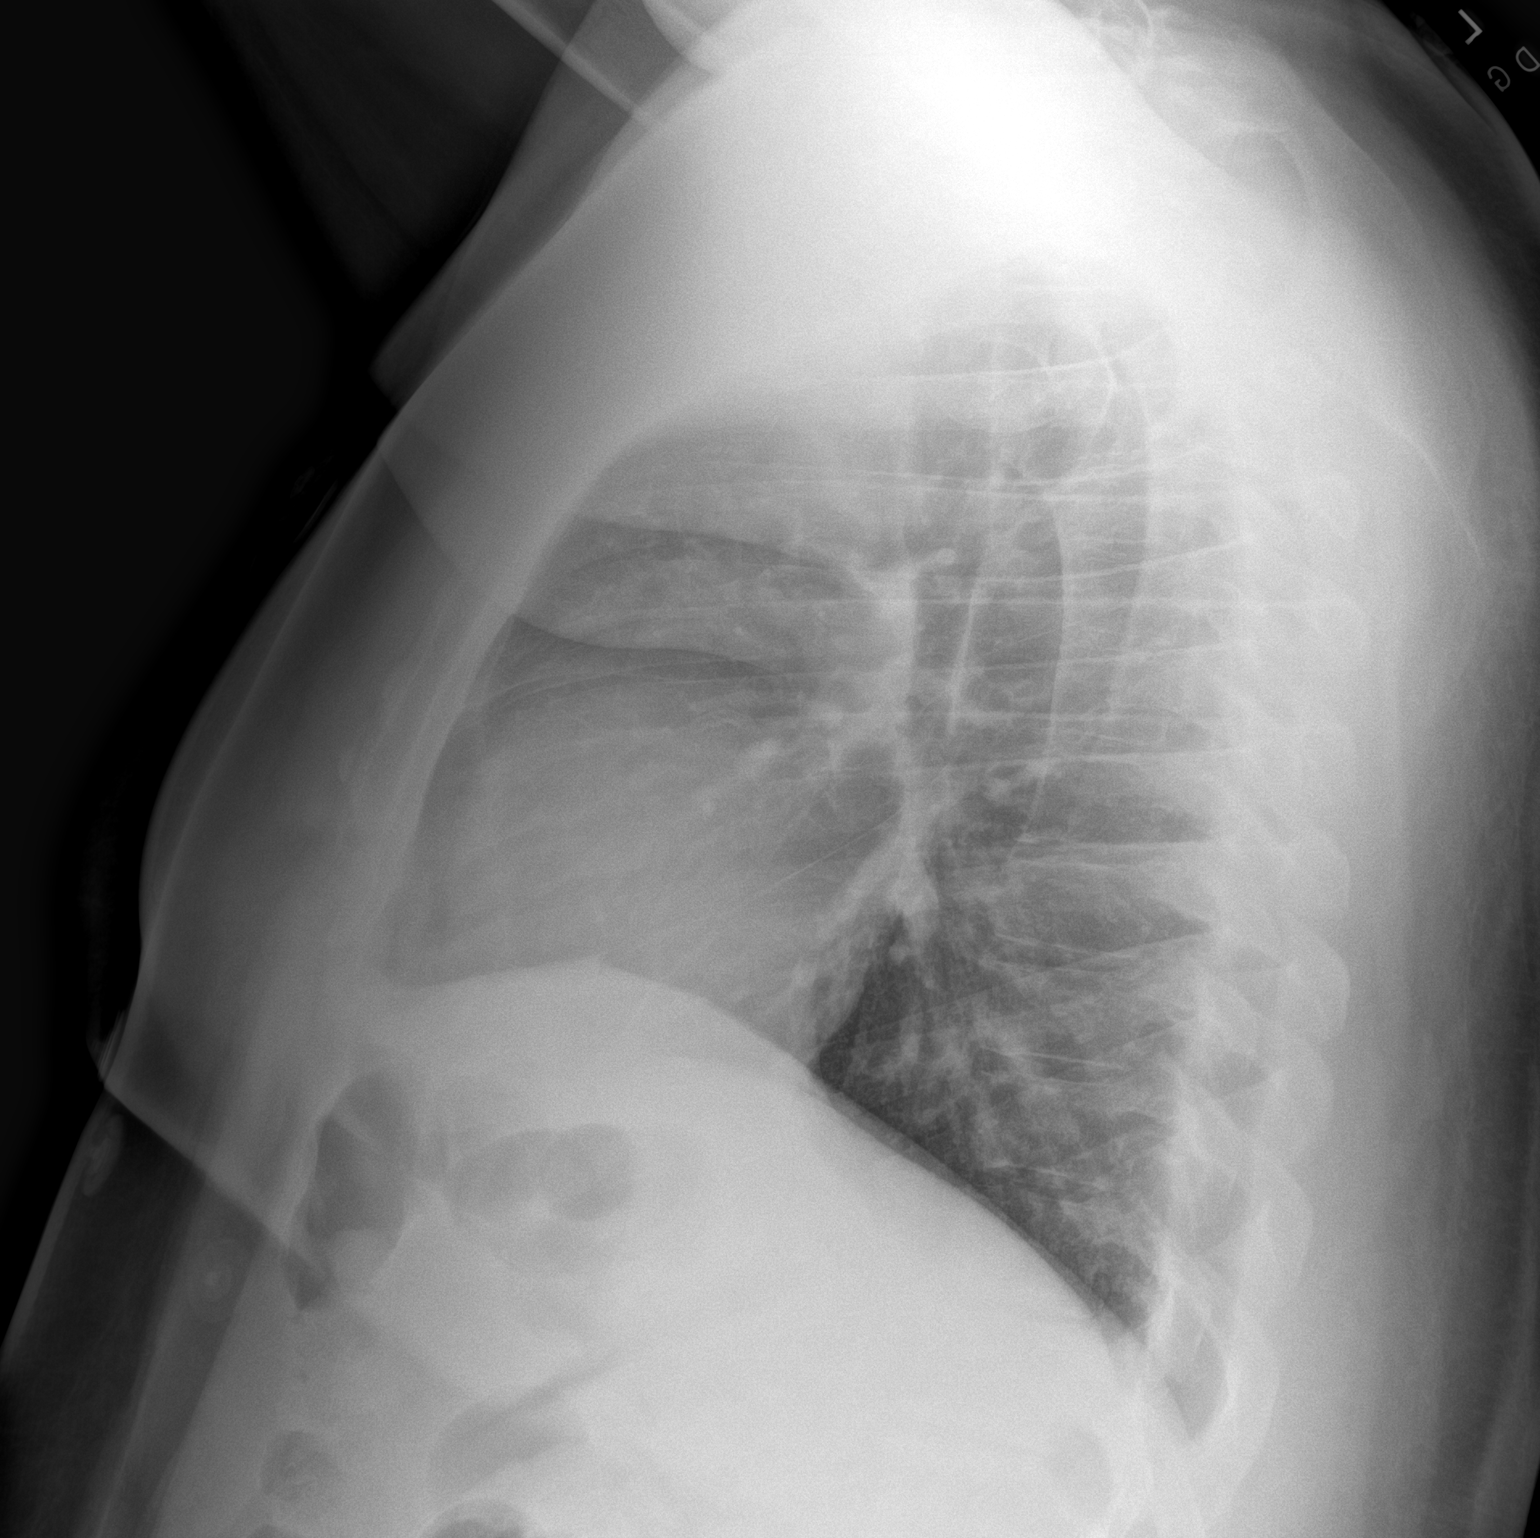

[2 of 2 positions shown; findings below may reference images not displayed]

FINDINGS: The heart size and mediastinal contours are within normal limits.
Both lungs are clear. The visualized skeletal structures are
unremarkable.
IMPRESSION: No active cardiopulmonary disease.

## 2022-08-25 IMAGING — DX DG CHEST 2V
2 series · 2 of 2 positions shown · non-contrast
Comparison: Chest x-ray September 22, 2021.

CLINICAL DATA: Chest pain.

EXAM:
CHEST - 2 VIEW

[chest pa]
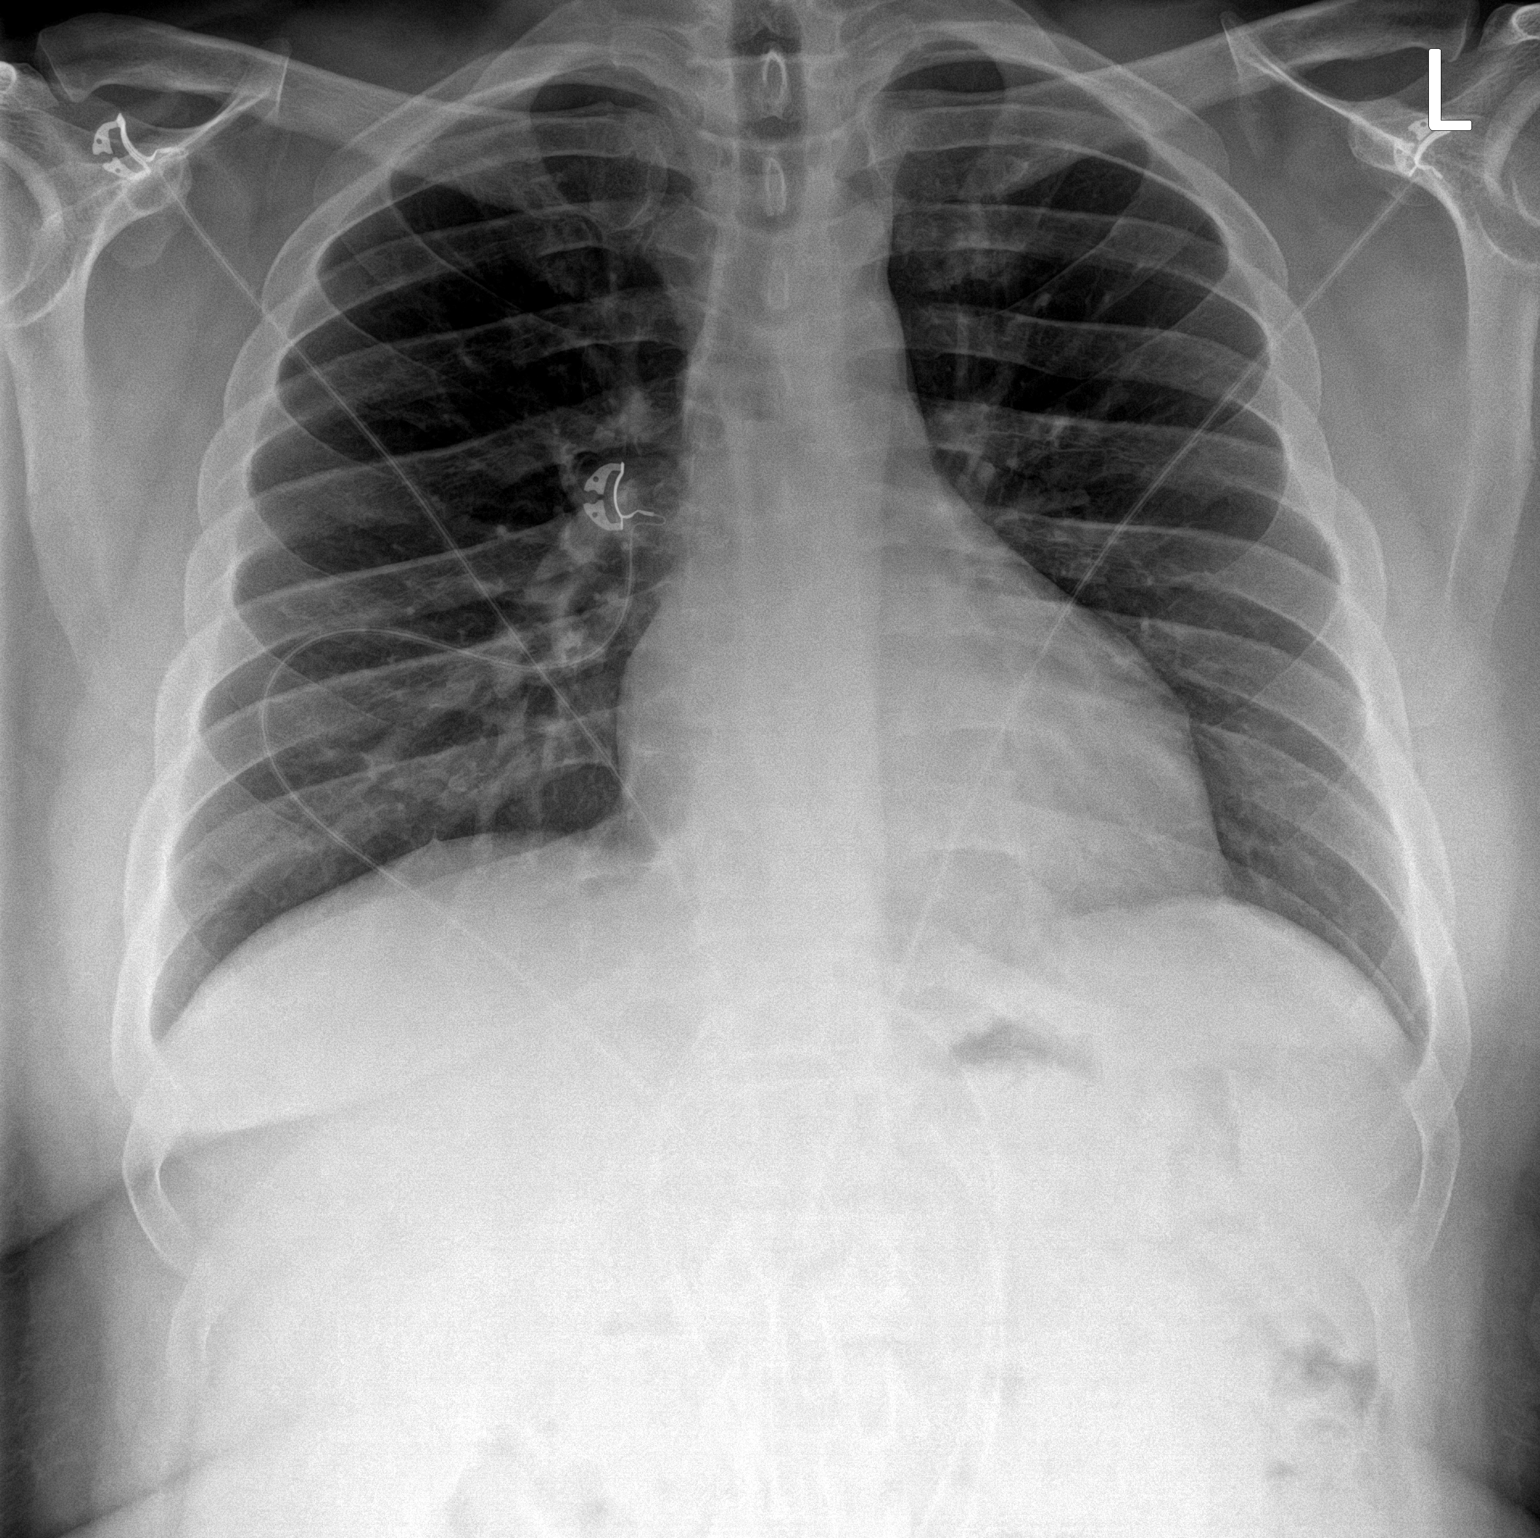

[chest lat]
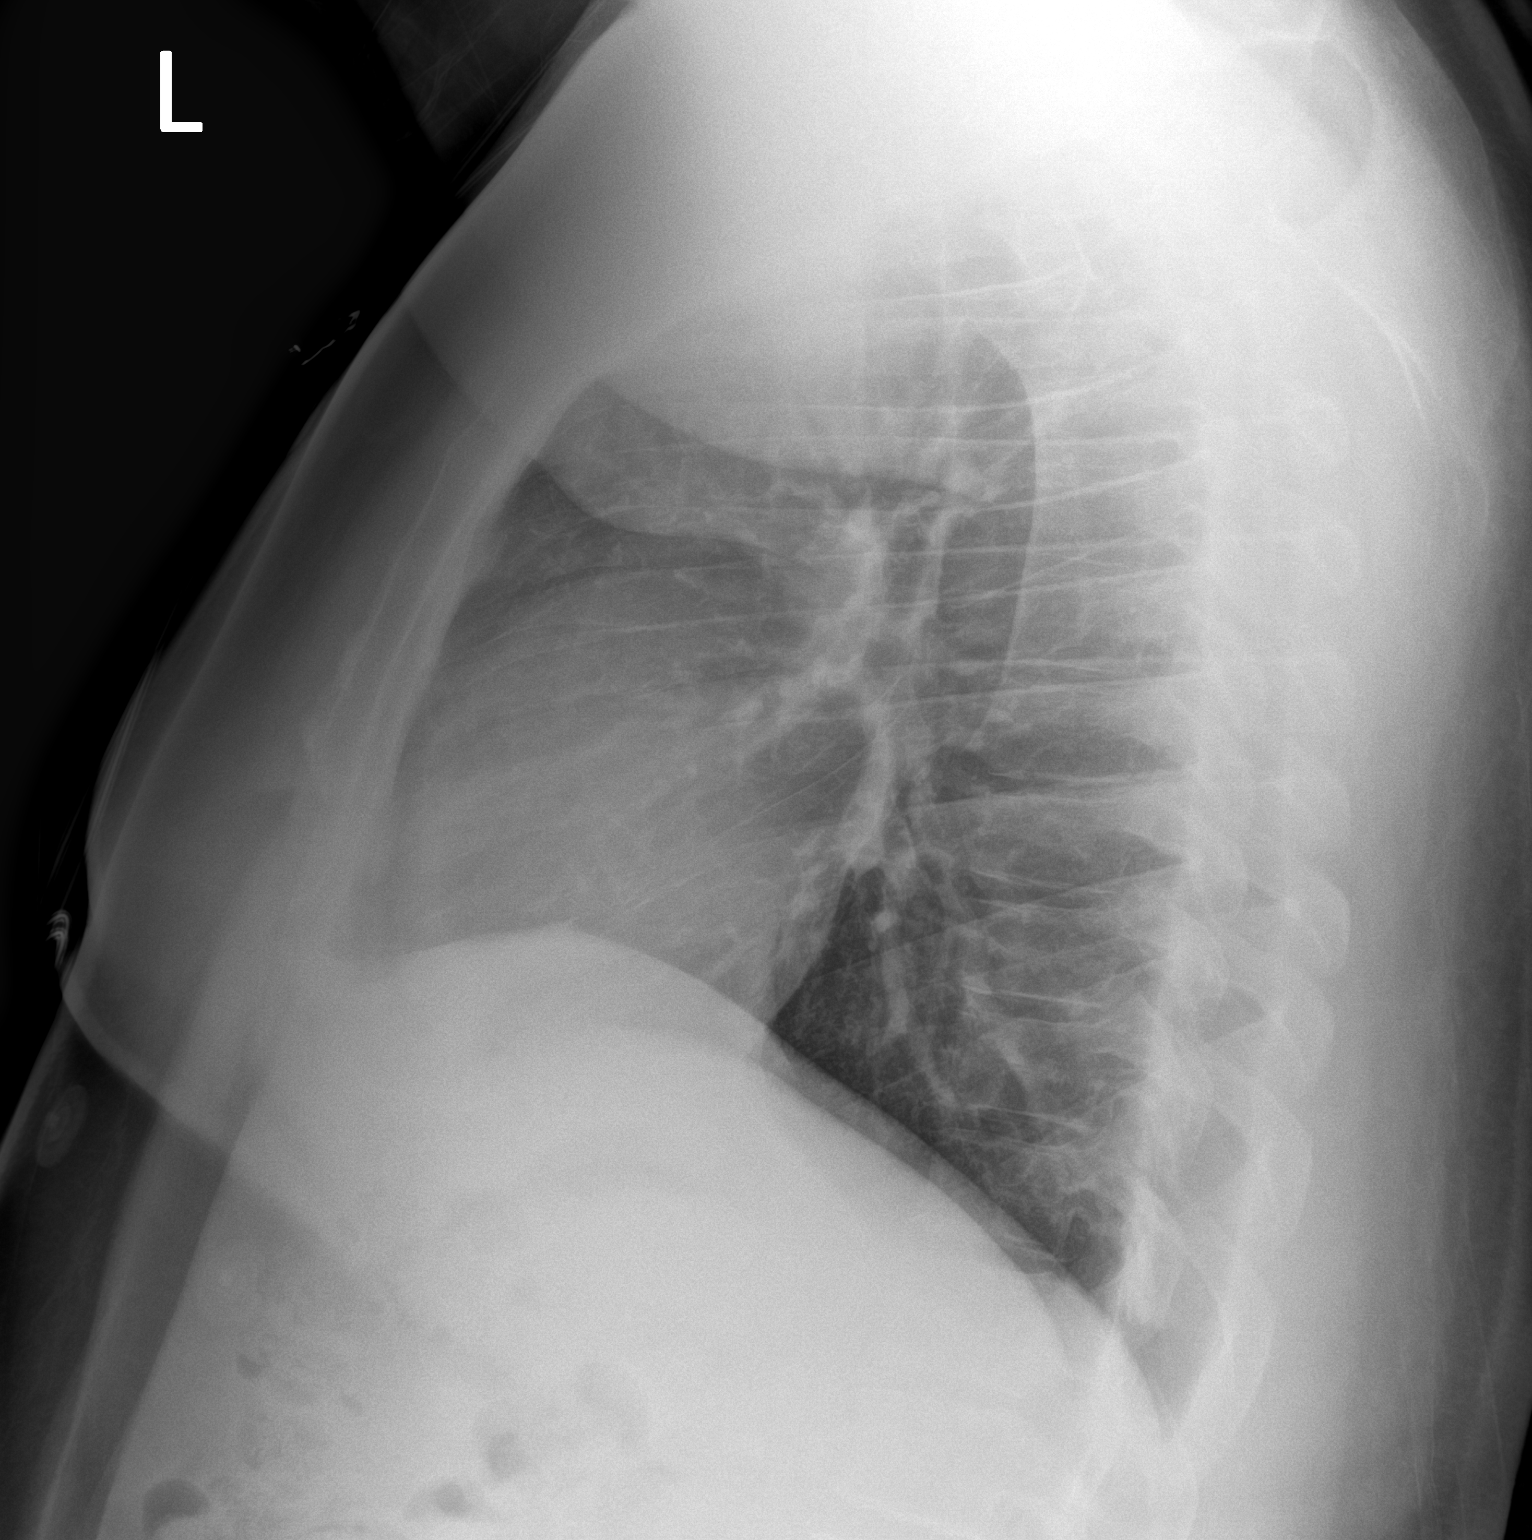

[2 of 2 positions shown; findings below may reference images not displayed]

FINDINGS: No consolidation. No visible pleural effusions or pneumothorax.
Cardiomediastinal silhouette is within normal limits. No acute
fracture.
IMPRESSION: No evidence of acute cardiopulmonary disease.

## 2022-08-27 ENCOUNTER — Other Ambulatory Visit: Payer: Self-pay | Admitting: Interventional Cardiology

## 2022-09-01 ENCOUNTER — Other Ambulatory Visit: Payer: Self-pay | Admitting: Interventional Cardiology

## 2022-09-29 ENCOUNTER — Other Ambulatory Visit: Payer: Self-pay | Admitting: Interventional Cardiology

## 2022-10-15 DIAGNOSIS — I129 Hypertensive chronic kidney disease with stage 1 through stage 4 chronic kidney disease, or unspecified chronic kidney disease: Secondary | ICD-10-CM | POA: Diagnosis not present

## 2022-10-15 DIAGNOSIS — I5020 Unspecified systolic (congestive) heart failure: Secondary | ICD-10-CM | POA: Diagnosis not present

## 2022-10-15 DIAGNOSIS — E1122 Type 2 diabetes mellitus with diabetic chronic kidney disease: Secondary | ICD-10-CM | POA: Diagnosis not present

## 2022-10-15 DIAGNOSIS — R319 Hematuria, unspecified: Secondary | ICD-10-CM | POA: Diagnosis not present

## 2022-10-15 DIAGNOSIS — N184 Chronic kidney disease, stage 4 (severe): Secondary | ICD-10-CM | POA: Diagnosis not present

## 2022-10-27 ENCOUNTER — Other Ambulatory Visit: Payer: Self-pay | Admitting: Interventional Cardiology

## 2022-10-27 DIAGNOSIS — M25512 Pain in left shoulder: Secondary | ICD-10-CM | POA: Diagnosis not present

## 2022-10-27 DIAGNOSIS — N185 Chronic kidney disease, stage 5: Secondary | ICD-10-CM | POA: Diagnosis not present

## 2022-10-27 DIAGNOSIS — E213 Hyperparathyroidism, unspecified: Secondary | ICD-10-CM | POA: Diagnosis not present

## 2022-10-27 DIAGNOSIS — E1169 Type 2 diabetes mellitus with other specified complication: Secondary | ICD-10-CM | POA: Diagnosis not present

## 2022-10-27 DIAGNOSIS — E782 Mixed hyperlipidemia: Secondary | ICD-10-CM | POA: Diagnosis not present

## 2022-10-31 ENCOUNTER — Other Ambulatory Visit: Payer: Self-pay | Admitting: Interventional Cardiology

## 2022-11-13 ENCOUNTER — Other Ambulatory Visit: Payer: Self-pay | Admitting: Interventional Cardiology

## 2022-11-15 MED ORDER — ISOSORBIDE DINITRATE 20 MG PO TABS: 20.0000 mg | ORAL_TABLET | Freq: Three times a day (TID) | ORAL | 0 refills | Status: DC

## 2022-11-15 NOTE — Addendum Note (Signed)
Addended by: Anselm Lis A on: 11/15/2022 08:08 AM   Modules accepted: Orders

## 2022-11-15 NOTE — Addendum Note (Signed)
Addended by: Anselm Lis A on: 11/15/2022 08:07 AM   Modules accepted: Orders

## 2022-11-22 DIAGNOSIS — M7542 Impingement syndrome of left shoulder: Secondary | ICD-10-CM | POA: Diagnosis not present

## 2022-11-30 ENCOUNTER — Other Ambulatory Visit: Payer: Self-pay | Admitting: Interventional Cardiology

## 2022-11-30 MED ORDER — ATORVASTATIN CALCIUM 80 MG PO TABS: 80.0000 mg | ORAL_TABLET | Freq: Every day | ORAL | 0 refills | Status: DC

## 2022-12-04 ENCOUNTER — Other Ambulatory Visit: Payer: Self-pay | Admitting: Interventional Cardiology

## 2022-12-24 ENCOUNTER — Other Ambulatory Visit: Payer: Self-pay | Admitting: Interventional Cardiology

## 2022-12-27 DIAGNOSIS — J90 Pleural effusion, not elsewhere classified: Secondary | ICD-10-CM | POA: Diagnosis not present

## 2022-12-27 DIAGNOSIS — R0602 Shortness of breath: Secondary | ICD-10-CM | POA: Diagnosis not present

## 2022-12-27 DIAGNOSIS — R079 Chest pain, unspecified: Secondary | ICD-10-CM | POA: Diagnosis not present

## 2022-12-28 ENCOUNTER — Other Ambulatory Visit: Payer: Self-pay | Admitting: Interventional Cardiology

## 2023-01-07 ENCOUNTER — Other Ambulatory Visit: Payer: Self-pay | Admitting: Interventional Cardiology

## 2023-01-08 ENCOUNTER — Other Ambulatory Visit: Payer: Self-pay | Admitting: Interventional Cardiology

## 2023-01-14 ENCOUNTER — Other Ambulatory Visit: Payer: Self-pay | Admitting: Interventional Cardiology

## 2023-01-23 ENCOUNTER — Other Ambulatory Visit: Payer: Self-pay | Admitting: Interventional Cardiology

## 2023-01-28 ENCOUNTER — Other Ambulatory Visit: Payer: Self-pay | Admitting: Interventional Cardiology

## 2023-02-02 ENCOUNTER — Other Ambulatory Visit: Payer: Self-pay | Admitting: Interventional Cardiology

## 2023-03-25 ENCOUNTER — Other Ambulatory Visit: Payer: Self-pay

## 2023-03-25 ENCOUNTER — Emergency Department (HOSPITAL_BASED_OUTPATIENT_CLINIC_OR_DEPARTMENT_OTHER): Payer: Medicaid Other | Admitting: Radiology

## 2023-03-25 ENCOUNTER — Encounter (HOSPITAL_BASED_OUTPATIENT_CLINIC_OR_DEPARTMENT_OTHER): Payer: Self-pay | Admitting: Emergency Medicine

## 2023-03-25 ENCOUNTER — Emergency Department (HOSPITAL_BASED_OUTPATIENT_CLINIC_OR_DEPARTMENT_OTHER): Payer: 59 | Admitting: Radiology

## 2023-03-25 DIAGNOSIS — Z7901 Long term (current) use of anticoagulants: Secondary | ICD-10-CM | POA: Insufficient documentation

## 2023-03-25 DIAGNOSIS — Z794 Long term (current) use of insulin: Secondary | ICD-10-CM | POA: Diagnosis not present

## 2023-03-25 DIAGNOSIS — Z7982 Long term (current) use of aspirin: Secondary | ICD-10-CM | POA: Diagnosis not present

## 2023-03-25 DIAGNOSIS — R0789 Other chest pain: Secondary | ICD-10-CM | POA: Diagnosis not present

## 2023-03-25 DIAGNOSIS — I10 Essential (primary) hypertension: Secondary | ICD-10-CM | POA: Diagnosis not present

## 2023-03-25 DIAGNOSIS — R079 Chest pain, unspecified: Secondary | ICD-10-CM | POA: Diagnosis not present

## 2023-03-25 LAB — BASIC METABOLIC PANEL
Anion gap: 11 (ref 5–15)
BUN: 52 mg/dL — ABNORMAL HIGH (ref 6–20)
CO2: 24 mmol/L (ref 22–32)
Calcium: 9 mg/dL (ref 8.9–10.3)
Chloride: 109 mmol/L (ref 98–111)
Creatinine, Ser: 5.11 mg/dL — ABNORMAL HIGH (ref 0.61–1.24)
GFR, Estimated: 14 mL/min — ABNORMAL LOW (ref >60)
Glucose, Bld: 92 mg/dL (ref 70–99)
Potassium: 3.7 mmol/L (ref 3.5–5.1)
Sodium: 144 mmol/L (ref 135–145)

## 2023-03-25 LAB — CBG MONITORING, ED: Glucose-Capillary: 91 mg/dL (ref 70–99)

## 2023-03-25 LAB — CBC
HCT: 30.7 % — ABNORMAL LOW (ref 39.0–52.0)
Hemoglobin: 10.2 g/dL — ABNORMAL LOW (ref 13.0–17.0)
MCH: 29.3 pg (ref 26.0–34.0)
MCHC: 33.2 g/dL (ref 30.0–36.0)
MCV: 88.2 fL (ref 80.0–100.0)
Platelets: 175 10*3/uL (ref 150–400)
RBC: 3.48 MIL/uL — ABNORMAL LOW (ref 4.22–5.81)
RDW: 13.2 % (ref 11.5–15.5)
WBC: 7.2 10*3/uL (ref 4.0–10.5)
nRBC: 0 % (ref 0.0–0.2)

## 2023-03-25 LAB — TROPONIN I (HIGH SENSITIVITY): Troponin I (High Sensitivity): 19 ng/L — ABNORMAL HIGH (ref <18)

## 2023-03-25 NOTE — ED Triage Notes (Signed)
Chest pain Off and on since Yesterday  Starts in chest and radiates to back

## 2023-03-26 ENCOUNTER — Emergency Department (HOSPITAL_BASED_OUTPATIENT_CLINIC_OR_DEPARTMENT_OTHER)
Admission: EM | Admit: 2023-03-26 | Discharge: 2023-03-26 | Disposition: A | Discharge status: Home / Self Care | Payer: 59 | Attending: Emergency Medicine | Admitting: Emergency Medicine

## 2023-03-26 DIAGNOSIS — I10 Essential (primary) hypertension: Secondary | ICD-10-CM

## 2023-03-26 DIAGNOSIS — Z7982 Long term (current) use of aspirin: Secondary | ICD-10-CM | POA: Diagnosis not present

## 2023-03-26 DIAGNOSIS — R079 Chest pain, unspecified: Secondary | ICD-10-CM

## 2023-03-26 NOTE — ED Notes (Signed)
Reviewed AVS with patient, patient expressed understanding of directions, denies further questions at this time. 

## 2023-03-26 NOTE — Discharge Instructions (Signed)
I am not sure what the cause of your chest pain is.  Please return for sudden worsening

## 2023-03-26 NOTE — ED Provider Notes (Signed)
EMERGENCY DEPARTMENT AT [facility] Provider Note   CSN: [id number] Arrival date & time: 03/25/23  2009     History  Chief Complaint  Patient presents with   Chest Pain    Audley Hinojos Fillinger is a 37 y.o. male.  37 yo M with a Chief complaint or chest pain.  Right-sided and pinpoint.  Seems to come and go.  He had a episode preceding where he was severely hypoglycemic.  Since then he is had the pain off and on.  Nothing seems to make it happen to him.  No obvious exertional symptoms.  Denies cough congestion or fever denies trauma.  Feels like it goes into his back at times.  Is now completely resolved.   Chest Pain      Home Medications Prior to Admission medications   Medication Sig Start Date End Date Taking? Authorizing Provider  aspirin EC 81 MG tablet Take 81 mg by mouth daily. Swallow whole.    [provider]  atorvastatin (LIPITOR) 80 MG tablet TAKE 1 TABLET BY MOUTH EVERY DAY 01/11/23   Corky Crafts, MD  carvedilol (COREG) 25 MG tablet TAKE 1 TABLET BY MOUTH TWICE A DAY IN THE MORNING AND IN THE EVENING 10/27/22   Corky Crafts, MD  chlorthalidone (HYGROTON) 25 MG tablet Take 12.5 mg by mouth every morning. 09/30/21   [provider]  clopidogrel (PLAVIX) 75 MG tablet Take 1 tablet (75 mg total) by mouth daily. Please call [phone] to schedule an overdue appointment for future refills. Thank you. Final attempt. 01/07/23   Corky Crafts, MD  hydrALAZINE (APRESOLINE) 50 MG tablet Take 1 tablet (50 mg total) by mouth 3 (three) times daily. Please call [phone] to schedule an overdue appointment for future refills. Thank you. Final attempt. 01/07/23   Corky Crafts, MD  isosorbide dinitrate (ISORDIL) 20 MG tablet TAKE 1 TABLET BY MOUTH THREE TIMES A DAY 01/28/23   Corky Crafts, MD  JARDIANCE 10 MG TABS tablet Take 10 mg by mouth daily. 08/26/21   [provider]  LANTUS SOLOSTAR 100 UNIT/ML  Solostar Pen Inject 50 Units into the skin at bedtime. 08/09/14   [provider]  nitroGLYCERIN (NITROSTAT) 0.4 MG SL tablet Place 1 tablet (0.4 mg total) under the tongue every 5 (five) minutes as needed for chest pain. 02/02/21 10/08/21  Corky Crafts, MD  NOVOLOG FLEXPEN 100 UNIT/ML FlexPen Inject 5-10 Units into the skin See admin instructions. Inject 5-10 units into the skin three times a day before meals, per sliding scale 02/24/18   [provider]      Allergies    Patient has no known allergies.    Review of Systems   Review of Systems  Cardiovascular:  Positive for chest pain.    Physical Exam Updated Vital Signs BP (!) 191/95   Pulse 80   Temp 98 F (36.7 C)   Resp 18   SpO2 97%  Physical Exam Vitals and nursing note reviewed.  Constitutional:      Appearance: He is well-developed.  HENT:     Head: Normocephalic and atraumatic.  Eyes:     Pupils: Pupils are equal, round, and reactive to light.  Neck:     Vascular: No JVD.  Cardiovascular:     Rate and Rhythm: Normal rate and regular rhythm.     Heart sounds: No murmur heard.    No friction rub. No gallop.  Pulmonary:  Effort: No respiratory distress.     Breath sounds: No wheezing.  Abdominal:     General: There is no distension.     Tenderness: There is no abdominal tenderness. There is no guarding or rebound.  Musculoskeletal:        General: Normal range of motion.     Cervical back: Normal range of motion and neck supple.  Skin:    Coloration: Skin is not pale.     Findings: No rash.  Neurological:     Mental Status: He is alert and oriented to person, place, and time.  Psychiatric:        Behavior: Behavior normal.     ED Results / Procedures / Treatments   Labs (all labs ordered are listed, but only abnormal results are displayed) Labs Reviewed  BASIC METABOLIC PANEL - Abnormal; Notable for the following components:      Result Value   BUN 52 (*)    Creatinine, Ser  5.11 (*)    GFR, Estimated 14 (*)    All other components within normal limits  CBC - Abnormal; Notable for the following components:   RBC 3.48 (*)    Hemoglobin 10.2 (*)    HCT 30.7 (*)    All other components within normal limits  TROPONIN I (HIGH SENSITIVITY) - Abnormal; Notable for the following components:   Troponin I (High Sensitivity) 19 (*)    All other components within normal limits  CBG MONITORING, ED  TROPONIN I (HIGH SENSITIVITY)    EKG EKG Interpretation Date/Time:  Friday March 25 2023 20:22:42 EDT Ventricular Rate:  75 PR Interval:  150 QRS Duration:  92 QT Interval:  416 QTC Calculation: 464 R Axis:   161  Text Interpretation: Normal sinus rhythm Right axis deviation Septal infarct , age undetermined Abnormal ECG When compared with ECG of 24-Feb-2022 05:17, No significant change since last tracing Confirmed by Meridee Score [phone]) on 03/25/2023 8:28:17 PM  Radiology DG Chest 2 View  Result Date: 03/25/2023 CLINICAL DATA:  Chest pain EXAM: CHEST - 2 VIEW COMPARISON:  02/24/2022 FINDINGS: The heart size and mediastinal contours are within normal limits. Both lungs are clear. The visualized skeletal structures are unremarkable. IMPRESSION: No active cardiopulmonary disease. Electronically Signed   By: Jasmine Pang M.D.   On: 03/25/2023 22:33    Procedures Procedures    Medications Ordered in ED Medications - No data to display  ED Course/ Medical Decision Making/ A&P                                 Medical Decision Making Amount and/or Complexity of Data Reviewed Labs: ordered. Radiology: ordered.   37 yo M with a chief complaint of right-sided chest pain.  Atypical in nature going on for couple days.  His troponin is 19.  Renal function unchanged from prior.  No significant anemia.  Chest x-ray independently interpreted by me without focal infiltrate or pneumothorax.  Patient is hypertensive, has a history of the same.  Is on multi  antihypertensive medications.  Do not feel comfortable making a change to his regimen.  Recommended he follow-up with his family doctor in the office.  Patient has had pain greater than 6 hours without significant change do not feel delta is warranted.  3:09 AM:  I have discussed the diagnosis/risks/treatment options with the patient and family.  Evaluation and diagnostic testing in the emergency department does not  suggest an emergent condition requiring admission or immediate intervention beyond what has been performed at this time.  They will follow up with PCP. We also discussed returning to the ED immediately if new or worsening sx occur. We discussed the sx which are most concerning (e.g., sudden worsening pain, fever, inability to tolerate by mouth) that necessitate immediate return. Medications administered to the patient during their visit and any new prescriptions provided to the patient are listed below.  Medications given during this visit Medications - No data to display   The patient appears reasonably screen and/or stabilized for discharge and I doubt any other medical condition or other [facility] requiring further screening, evaluation, or treatment in the ED at this time prior to discharge.          Final Clinical Impression(s) / ED Diagnoses Final diagnoses:  Nonspecific chest pain  Uncontrolled hypertension    Rx / DC Orders ED Discharge Orders     None         Melene Plan, DO 03/26/23 0309

## 2023-04-11 ENCOUNTER — Other Ambulatory Visit: Payer: Self-pay | Admitting: Interventional Cardiology

## 2023-04-12 ENCOUNTER — Encounter: Payer: Self-pay | Admitting: Podiatry

## 2023-04-12 ENCOUNTER — Other Ambulatory Visit: Payer: Self-pay | Admitting: Interventional Cardiology

## 2023-04-12 ENCOUNTER — Ambulatory Visit: Payer: 59 | Admitting: Podiatry

## 2023-04-12 VITALS — BP 184/85 | HR 87

## 2023-04-12 DIAGNOSIS — L97521 Non-pressure chronic ulcer of other part of left foot limited to breakdown of skin: Secondary | ICD-10-CM

## 2023-04-12 DIAGNOSIS — Z794 Long term (current) use of insulin: Secondary | ICD-10-CM

## 2023-04-12 DIAGNOSIS — E119 Type 2 diabetes mellitus without complications: Secondary | ICD-10-CM

## 2023-04-12 NOTE — Progress Notes (Signed)
Subjective:  Patient ID: [name], male    DOB: 08-09-1985,  MRN: [id number]  Chief Complaint  Patient presents with   Foot Ulcer    "My feet were swollen a few days ago.  I have a little hole on my left foot where the wound was open."    37 y.o. male presents with the above complaint.  Patient presents with left submetatarsal 1 ulceration limited to the breakdown of skin painful to touch as well as gotten worse worse with ambulation shoe pressure he has not seen and was prior to seeing me.  He has been doing his own.  He is a diabetic.  No pain.   Review of Systems: Negative except as noted in the HPI. Denies N/V/F/Ch.  Past Medical History:  Diagnosis Date   Coronary artery disease    Diabetes mellitus without complication (HCC)    Hypercholesteremia    Hypertension    Myocardial infarction (HCC) 01/2020    Current Outpatient Medications:    aspirin EC 81 MG tablet, Take 81 mg by mouth daily. Swallow whole., Disp: , Rfl:    chlorthalidone (HYGROTON) 25 MG tablet, Take 12.5 mg by mouth every morning., Disp: , Rfl:    isosorbide dinitrate (ISORDIL) 20 MG tablet, TAKE 1 TABLET BY MOUTH THREE TIMES A DAY, Disp: 30 tablet, Rfl: 0   JARDIANCE 10 MG TABS tablet, Take 10 mg by mouth daily., Disp: , Rfl:    LANTUS SOLOSTAR 100 UNIT/ML Solostar Pen, Inject 50 Units into the skin at bedtime., Disp: , Rfl:    NOVOLOG FLEXPEN 100 UNIT/ML FlexPen, Inject 5-10 Units into the skin See admin instructions. Inject 5-10 units into the skin three times a day before meals, per sliding scale, Disp: , Rfl: 0   atorvastatin (LIPITOR) 80 MG tablet, TAKE 1 TABLET BY MOUTH EVERY DAY, Disp: 90 tablet, Rfl: 0   carvedilol (COREG) 25 MG tablet, TAKE 1 TABLET BY MOUTH TWICE A DAY IN THE MORNING AND IN THE EVENING, Disp: 30 tablet, Rfl: 0   ezetimibe (ZETIA) 10 MG tablet, Take 1 tablet (10 mg total) by mouth daily., Disp: 90 tablet, Rfl: 3   hydrALAZINE (APRESOLINE) 50 MG tablet, Take 1.5 tablets (75 mg  total) by mouth 3 (three) times daily., Disp: 270 tablet, Rfl: 3   nitroGLYCERIN (NITROSTAT) 0.4 MG SL tablet, Place 1 tablet (0.4 mg total) under the tongue every 5 (five) minutes as needed for chest pain., Disp: 90 tablet, Rfl: 3   torsemide (DEMADEX) 20 MG tablet, Take 20 mg by mouth 2 (two) times daily., Disp: , Rfl:   Social History   Tobacco Use  Smoking Status Never  Smokeless Tobacco Never    No Known Allergies Objective:   Vitals:   04/12/23 0913  BP: (!) 184/85  Pulse: 87   There is no height or weight on file to calculate BMI. Constitutional Well developed. Well nourished.  Vascular Dorsalis pedis pulses palpable bilaterally. Posterior tibial pulses palpable bilaterally. Capillary refill normal to all digits.  No cyanosis or clubbing noted. Pedal hair growth normal.  Neurologic Normal speech. Oriented to person, place, and time. Epicritic sensation to light touch grossly present bilaterally.  Dermatologic Left submetatarsal 1 ulceration limited to breakdown of skin does not probe down to deep tissue or bone.  No purulent drainage noted no erythema or cellulitis noted.  No malodor present  Orthopedic: Normal joint ROM without pain or crepitus bilaterally. No visible deformities. No bony tenderness.   Radiographs: None  Assessment:   1. Chronic foot ulcer, limited to breakdown of skin, left (HCC)   2. Insulin-requiring or dependent type II diabetes mellitus (HCC)    Plan:  Patient was evaluated and treated and all questions answered.  Left submetatarsal 1 ulceration limited to the breakdown of the skin -All questions and concerns were discussed with the patient in extensive detail -At this time I encouraged him to do Betadine wet-to-dry dressing.  He states understanding to daily dressing changes -Encourage neurosurgical shoe. -At this time no clinical concern for infection.  I discussed with him that if he regresses to go to the emergency room right away he  states understanding  No follow-ups on file.

## 2023-04-13 ENCOUNTER — Telehealth: Payer: Self-pay | Admitting: Interventional Cardiology

## 2023-04-13 NOTE — Telephone Encounter (Signed)
*  STAT* If patient is at the pharmacy, call can be transferred to refill team.   1. Which medications need to be refilled? (please list name of each medication and dose if known)   clopidogrel (PLAVIX) 75 MG tablet  hydrALAZINE (APRESOLINE) 50 MG tablet   2. Would you like to learn more about the convenience, safety, & potential cost savings by using the [facility]?   3. Are you open to using the [facility] (Type [facility]. ).  4. Which pharmacy/location (including street and city if local pharmacy) is medication to be sent to?  [id number] - [place], [place] - [address]    5. Do they need a 30 day or 90 day supply?   90 day  Patient stated he is completely out of these medications.  Patient has appointment scheduled on 11/7.

## 2023-04-16 ENCOUNTER — Other Ambulatory Visit: Payer: Self-pay | Admitting: Interventional Cardiology

## 2023-04-20 ENCOUNTER — Other Ambulatory Visit: Payer: Self-pay | Admitting: Interventional Cardiology

## 2023-04-21 ENCOUNTER — Telehealth: Payer: Self-pay | Admitting: *Deleted

## 2023-04-21 ENCOUNTER — Ambulatory Visit: Payer: 59 | Attending: Internal Medicine | Admitting: Internal Medicine

## 2023-04-21 ENCOUNTER — Encounter: Payer: Self-pay | Admitting: Internal Medicine

## 2023-04-21 VITALS — BP 180/92 | HR 80 | Resp 14 | Ht 76.0 in | Wt 306.8 lb

## 2023-04-21 DIAGNOSIS — I5020 Unspecified systolic (congestive) heart failure: Secondary | ICD-10-CM | POA: Diagnosis not present

## 2023-04-21 DIAGNOSIS — I25119 Atherosclerotic heart disease of native coronary artery with unspecified angina pectoris: Secondary | ICD-10-CM | POA: Diagnosis not present

## 2023-04-21 DIAGNOSIS — I160 Hypertensive urgency: Secondary | ICD-10-CM | POA: Diagnosis not present

## 2023-04-21 DIAGNOSIS — N1831 Chronic kidney disease, stage 3a: Secondary | ICD-10-CM

## 2023-04-21 DIAGNOSIS — I10 Essential (primary) hypertension: Secondary | ICD-10-CM | POA: Diagnosis not present

## 2023-04-21 DIAGNOSIS — E669 Obesity, unspecified: Secondary | ICD-10-CM

## 2023-04-21 MED ORDER — HYDRALAZINE HCL 50 MG PO TABS: 75.0000 mg | ORAL_TABLET | Freq: Three times a day (TID) | ORAL | 3 refills | Status: DC

## 2023-04-21 NOTE — Telephone Encounter (Signed)
Pt was given Itamar Sleep Device, SN# [id number].  Pt aware that someone will contact him with approval # once insurance has given approval.  Pt aware not to open package until then. Dr. Izora Ribas ordering provider.

## 2023-04-21 NOTE — Patient Instructions (Addendum)
Medication Instructions:  Your physician has recommended you make the following change in your medication:  INCREASE: hydralazine to 75 mg by mouth three times daily.  (1 and a half 50 mg tablets)  STOP: clopidogrel (Plavix) *If you need a refill on your cardiac medications before your next appointment, please call your pharmacy*   Lab Work: Iron panel, FLP, Lpa   If you have labs (blood work) drawn today and your tests are completely normal, you will receive your results only by: MyChart Message (if you have MyChart) OR A paper copy in the mail If you have any lab test that is abnormal or we need to change your treatment, we will call you to review the results.   Testing/Procedures: Your physician has requested that you have an echocardiogram. Echocardiography is a painless test that uses sound waves to create images of your heart. It provides your doctor with information about the size and shape of your heart and how well your heart's chambers and valves are working. This procedure takes approximately one hour. There are no restrictions for this procedure. Please do NOT wear cologne, perfume, aftershave, or lotions (deodorant is allowed). Please arrive 15 minutes prior to your appointment time.  Please note: We ask at that you not bring children with you during ultrasound (echo/ vascular) testing. Due to room size and safety concerns, children are not allowed in the ultrasound rooms during exams. Our front office staff cannot provide observation of children in our lobby area while testing is being conducted. An adult accompanying a patient to their appointment will only be allowed in the ultrasound room at the discretion of the ultrasound technician under special circumstances. We apologize for any inconvenience.  Your physician has requested that you have a Home Sleep Study.    Follow-Up: At [facility], you and your health needs are our priority.  As part of our continuing  mission to provide you with exceptional heart care, we have created designated Provider Care Teams.  These Care Teams include your primary Cardiologist (physician) and Advanced Practice Providers (APPs -  Physician Assistants and Nurse Practitioners) who all work together to provide you with the care you need, when you need it.   Your next appointment:   3 month(s)  Provider:   Eligha Bridegroom, NP    Then, Riley Lam, MD will plan to see you again in 6 month(s).

## 2023-04-21 NOTE — Progress Notes (Signed)
Cardiology Office Note:  .    Date:  04/21/2023  ID:  [name], DOB 04/18/1986, MRN [id number] PCP: Soundra Pilon, FNP  [facility] Providers Cardiologist:  Lance Muss, MD     CC: Transition to new cardiologist  History of Present Illness: .    [name] is a 37 y.o. male l with a history of insulin-dependent diabetes, hypertension, hyperlipidemia, and coronary artery disease, presents for a follow-up consultation. The patient had an acute heart failure episode in December 2023, with a subsequent recovery of ejection fraction to 35% by 2023. He was found to have a 100% LAD, indicative of a late-presenting myocardial infarction (2021), and an LVF of 25-30% with LAD wall motion abnormalities.  The patient's diabetes is managed by Peri Maris, FNP. He is morbidly obese, and there is a high risk of sleep apnea. The patient is also anemic, with low blood counts that have remained stable over time.  The patient reports no current chest pain or breathing difficulties. However, he has been experiencing isolated swelling in his left leg. He is not physically active due to the nature of his work and multiple start-up businesses. He reports poor sleep in the past, but it has improved recently (Wife notes snoring and apnea). He has not had breakfast on the day of the consultation and it has been a while since his cholesterol was last checked.  He reports that his blood pressure improves after taking his medications, but it still tends to be a little elevated. He has not taken his blood pressure pills on the day of the consultation.No chest pain.  No shortness of breath or cardiac symptoms.  Relevant histories: .  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   Studies Reviewed: .   Cardiac Studies & Procedures   CARDIAC CATHETERIZATION  CARDIAC CATHETERIZATION 01/28/2020  Narrative  Prox LAD lesion is  100% stenosed. Late presenting anterior MI, as his symptoms have been going on for about 24 hours.  A drug-eluting stent was successfully placed using a SYNERGY XD 2.50X24, postdilated to greater than 3 mm in diameter.  Post intervention, there is a 0% residual stenosis.  There is moderate to severe left ventricular systolic dysfunction with anterior wall hypokinesis.  LV end diastolic pressure is mildly elevated.  The left ventricular ejection fraction is 25-35% by visual estimate.  There is no aortic valve stenosis.  Watch for 48 hours.  Check echocardiogram.  May need to consider LifeVest.  Holding lisinopril at this time since his creatinine was elevated in the Cath Lab at the time of procedure.  1 year of dual antiplatelet therapy recommended along with aggressive secondary prevention.  Findings Coronary Findings Diagnostic  Dominance: Co-dominant  Left Anterior Descending Prox LAD lesion is 100% stenosed.  Right Coronary Artery The vessel exhibits minimal luminal irregularities.  Intervention  Prox LAD lesion Stent CATH LAUNCHER 6FR EBU3.5 guide catheter was inserted. Lesion crossed with guidewire using a WIRE FIGHTER CROSSING. Pre-stent angioplasty was performed using a BALLOON SAPPHIRE 2.0X12. A drug-eluting stent was successfully placed using a SYNERGY XD 2.50X24. Stent strut is well apposed. Post-stent angioplasty was performed using a BALLOON SAPPHIRE [place] 3.0X15. There was difficulty in crossing this lesion which caused a delay in reperfusion.  Since he presented late, a Fighter wire was needed to cross as the lesion acted more chronic.  There was difficulty in locating the distal vessel as well due  to the anatomy. Post-Intervention Lesion Assessment The intervention was successful. Pre-interventional TIMI flow is 0. Post-intervention TIMI flow is 3. No complications occurred at this lesion. There is a 0% residual stenosis post intervention.      ECHOCARDIOGRAM  ECHOCARDIOGRAM COMPLETE 04/01/2020  Narrative ECHOCARDIOGRAM REPORT    Patient Name:   [name] Date of Exam: 04/01/2020 Medical Rec #:  [id number]          Height:       76.0 in Accession #:    [id number]         Weight:       306.4 lb Date of Birth:  23-Jun-1985          BSA:          2.656 m Patient Age:    34 years           BP:           138/76 mmHg Patient Gender: M                  HR:           89 bpm. Exam Location:  [address]  Procedure: 2D Echo, Cardiac Doppler, Color Doppler and Intracardiac Opacification Agent  Indications:    I25.118 CAD  History:        Patient has prior history of Echocardiogram examinations, most recent 01/30/2020. Previous Myocardial Infarction; Risk Factors:Hypertension, Diabetes, Dyslipidemia, Family History of Coronary Artery Disease and Obesity.  Sonographer:    Garald Braver, RDCS Referring Phys: [phone] Corky Crafts   Sonographer Comments: Technically difficult study due to poor echo windows and suboptimal apical window. Image acquisition challenging due to patient body habitus. IMPRESSIONS   1. Apical akinesis. Severe hypokinesis of the inferoseptum and apical anteroseptum. Left ventricular ejection fraction, by estimation, is 35 to 40%. The left ventricle has moderately decreased function. The left ventricle demonstrates regional wall motion abnormalities (see scoring diagram/findings for description). There is mild left ventricular hypertrophy of the posterior segment. Left ventricular diastolic parameters are consistent with Grade I diastolic dysfunction (impaired relaxation). 2. Right ventricular systolic function is normal. The right ventricular size is normal. 3. The mitral valve is normal in structure. Trivial mitral valve regurgitation. No evidence of mitral stenosis. 4. The aortic valve is tricuspid. Aortic valve regurgitation is not visualized. No aortic stenosis is present. 5. The inferior  vena cava is normal in size with greater than 50% respiratory variability, suggesting right atrial pressure of 3 mmHg.  Comparison(s): 01/30/20 EF 40-45%.  FINDINGS Left Ventricle: Apical akinesis. Severe hypokinesis of the inferoseptum and apical anteroseptum. Left ventricular ejection fraction, by estimation, is 35 to 40%. The left ventricle has moderately decreased function. The left ventricle demonstrates regional wall motion abnormalities. Definity contrast agent was given IV to delineate the left ventricular endocardial borders. The left ventricular internal cavity size was normal in size. There is mild left ventricular hypertrophy of the posterior segment. Left ventricular diastolic parameters are consistent with Grade I diastolic dysfunction (impaired relaxation). Normal left ventricular filling pressure.  Right Ventricle: The right ventricular size is normal. No increase in right ventricular wall thickness. Right ventricular systolic function is normal.  Left Atrium: Left atrial size was normal in size.  Right Atrium: Right atrial size was normal in size.  Pericardium: There is no evidence of pericardial effusion.  Mitral Valve: The mitral valve is normal in structure. Trivial mitral valve regurgitation. No evidence of mitral valve stenosis.  Tricuspid Valve:  The tricuspid valve is normal in structure. Tricuspid valve regurgitation is not demonstrated. No evidence of tricuspid stenosis.  Aortic Valve: The aortic valve is tricuspid. Aortic valve regurgitation is not visualized. No aortic stenosis is present.  Pulmonic Valve: The pulmonic valve was normal in structure. Pulmonic valve regurgitation is trivial. No evidence of pulmonic stenosis.  Aorta: The aortic root is normal in size and structure.  Venous: The inferior vena cava is normal in size with greater than 50% respiratory variability, suggesting right atrial pressure of 3 mmHg.  IAS/Shunts: No atrial level shunt detected  by color flow Doppler.   LEFT VENTRICLE PLAX 2D LVIDd:         5.20 cm  Diastology LVIDs:         3.40 cm  LV e' medial:    6.98 cm/s LV PW:         1.20 cm  LV E/e' medial:  7.7 LV IVS:        0.90 cm  LV e' lateral:   3.98 cm/s LVOT diam:     2.40 cm  LV E/e' lateral: 13.5 LV SV:         57 LV SV Index:   21 LVOT Area:     4.52 cm   RIGHT VENTRICLE RV Basal diam:  2.90 cm RV S prime:     18.10 cm/s TAPSE (M-mode): 1.6 cm  LEFT ATRIUM             Index       RIGHT ATRIUM           Index LA diam:        3.80 cm 1.43 cm/m  RA Pressure: 3.00 mmHg LA Vol (A2C):   26.6 ml 10.02 ml/m RA Area:     8.55 cm LA Vol (A4C):   30.4 ml 11.47 ml/m RA Volume:   14.30 ml  5.38 ml/m LA Biplane Vol: 30.8 ml 11.60 ml/m AORTIC VALVE LVOT Vmax:   78.30 cm/s LVOT Vmean:  53.800 cm/s LVOT VTI:    0.125 m  AORTA Ao Root diam: 3.60 cm Ao Asc diam:  3.10 cm  MITRAL VALVE               TRICUSPID VALVE Estimated RAP:  3.00 mmHg  MV E velocity: 53.70 cm/s  SHUNTS MV A velocity: 84.40 cm/s  Systemic VTI:  0.12 m MV E/A ratio:  0.64        Systemic Diam: 2.40 cm  Chilton Si MD Electronically signed by Chilton Si MD Signature Date/Time: 04/01/2020/2:12:42 PM    Final              Physical Exam:    VS:  BP (!) 180/92 (BP Location: Left Arm, Patient Position: Sitting, Cuff Size: Large)   Pulse 80   Resp 14   Ht 6\' 4"  (1.93 m)   Wt (!) 306 lb 12.8 oz (139.2 kg)   SpO2 98%   BMI 37.34 kg/m    Wt Readings from Last 3 Encounters:  04/21/23 (!) 306 lb 12.8 oz (139.2 kg)  02/24/22 300 lb (136.1 kg)  12/09/21 (!) 317 lb 3.9 oz (143.9 kg)    Gen: no distress, morbid obesity   Neck: No JVD,  Cardiac: No Rubs or Gallops, no murmur, RRR +2 radial pulses Respiratory: Clear to auscultation bilaterally, normal effort, normal  respiratory rate GI: Soft, nontender, non-distended  MS: +1 left leg swelling, moves all extremities Integument: Skin feels warm Neuro:  At time of  evaluation, alert and oriented to person/place/time/situation  Psych: Normal affect, patient feels well   ASSESSMENT AND PLAN: .    Coronary Artery Disease - History of 100% LAD occlusion with late presenting myocardial infarction. Atypical chest pain, medically managed. On aspirin, atorvastatin, carvedilol, and isosorbide dinitrate. -Discontinue Plavix due to equal risk/benefit at this stage post-MI from 2021 -Continue aspirin, atorvastatin, carvedilol, and isosorbide dinitrate.  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 On Jardiance, isosorbide dinitrate, hydralazine, and nitroglycerin PRN. - NYHA I, Stage C -Increase hydralazine to 75mg  TID. -Order repeat echocardiogram to assess current ejection fraction.  Hypertension - he has not taken his medication today, elevated blood pressure readings, on hydralazine. -Increase hydralazine to 75mg  TID  Chronic Kidney Disease Stage 4 - Complicating management of ischemic heart disease and heart failure. No current plans for dialysis. -Continue current management and follow-up with nephrologist (send note to Dr. Melanee Spry)  Anemia Chronic anemia with hemoglobin 10.6. Possible iron deficiency. -Order iron studies to assess for iron deficiency anemia.  Hyperlipidemia On atorvastatin. -Order fasting lipids and LP(a) to assess current lipid control.  Morbid Obesity Discussed lifestyle changes and potential future medication options for weight loss. -Encourage diet and lifestyle changes. - no history of MEN or medullary thyroid cancer - no history of pancreatitis or gallstones. - no history of reflux - would meet criteria for GLP-1 based therapy - we have discussed risks for reflux - discussed risks of sarcopenia and exercise methods to decrease this risk - discussed protein intake, 25 g fiber intake, hydration, small meals,  and decreasing high fats to minimize symptom burden - discussed this as a long term therapy  Suspected Obstructive Sleep Apnea High risk based on obesity and reported symptoms (STOPBANG 5) -Order home sleep study to assess for obstructive sleep apnea.  Follow-up Plans -See Eligha Bridegroom NP in 3 months for secondary prevention discussion. -Return visit in 6 months to assess response to changes in medical therapy.  Time Spent Directly with Patient:   I have spent a total of 54 minutes with the patient reviewing notes, imaging, EKGs, labs, discussing lifestyle interventions and examining the patient as well as establishing an assessment and plan that was discussed personally with the patient and his wife.   Riley Lam, MD FASE [facility] Cardiologist [facility]  [address] [place], [address] [place], [place] [address] [phone]  11:20 AM

## 2023-04-22 ENCOUNTER — Telehealth: Payer: Self-pay | Admitting: Interventional Cardiology

## 2023-04-22 ENCOUNTER — Telehealth: Payer: Self-pay | Admitting: *Deleted

## 2023-04-22 DIAGNOSIS — I25110 Atherosclerotic heart disease of native coronary artery with unstable angina pectoris: Secondary | ICD-10-CM

## 2023-04-22 DIAGNOSIS — E785 Hyperlipidemia, unspecified: Secondary | ICD-10-CM

## 2023-04-22 LAB — IRON,TIBC AND FERRITIN PANEL
Ferritin: 293 ng/mL (ref 30–400)
Iron Saturation: 20 % (ref 15–55)
Iron: 38 ug/dL (ref 38–169)
Total Iron Binding Capacity: 192 ug/dL — ABNORMAL LOW (ref 250–450)
UIBC: 154 ug/dL (ref 111–343)

## 2023-04-22 LAB — LIPID PANEL
Chol/HDL Ratio: 13 ratio — ABNORMAL HIGH (ref 0.0–5.0)
Cholesterol, Total: 311 mg/dL — ABNORMAL HIGH (ref 100–199)
HDL: 24 mg/dL — ABNORMAL LOW (ref >39)
LDL Chol Calc (NIH): 218 mg/dL — ABNORMAL HIGH (ref 0–99)
Triglycerides: 330 mg/dL — ABNORMAL HIGH (ref 0–149)
VLDL Cholesterol Cal: 69 mg/dL — ABNORMAL HIGH (ref 5–40)

## 2023-04-22 MED ORDER — EZETIMIBE 10 MG PO TABS: 10.0000 mg | ORAL_TABLET | Freq: Every day | ORAL | 3 refills | Status: DC

## 2023-04-22 NOTE — Telephone Encounter (Signed)
Patient returned RN's call regarding results. 

## 2023-04-22 NOTE — Telephone Encounter (Signed)
DR. Jenne Campus.   Patient agreement reviewed and signed on 04/22/2023.  WatchPAT issued to patient on 04/22/2023 by Danielle Rankin, CMA. Patient aware to not open the WatchPAT box until contacted with the activation PIN. Patient profile initialized in CloudPAT on 04/22/2023 by Elliot Cousin, RMA. Device serial number: [id number]  Please list Reason for Call as Advice Only and type "WatchPAT issued to patient" in the comment box.

## 2023-04-22 NOTE — Telephone Encounter (Signed)
-----   Message from [facility] Okey Regal F sent at 04/22/2023  8:44 AM EST ----- Regarding: Donnie Coffin Patient Calls (Newest Message First) View All Conversations on this Encounter [place], [place], [place] routed conversation to You; Cv Div Sleep Studies22 hours ago (10:23 AM)  Elliot Cousin, RMA22 hours ago (10:23 AM)   Pt was given Itamar Sleep Device, SN# [id number].  Pt aware that someone will contact him with approval # once insurance has given approval.  Pt aware not to open package until then. Dr. Izora Ribas ordering provider.   Note  [place], [place], [place]

## 2023-04-27 ENCOUNTER — Other Ambulatory Visit: Payer: Self-pay | Admitting: Internal Medicine

## 2023-05-05 ENCOUNTER — Other Ambulatory Visit: Payer: Self-pay | Admitting: Interventional Cardiology

## 2023-05-10 ENCOUNTER — Ambulatory Visit: Payer: 59 | Admitting: Podiatry

## 2023-05-18 NOTE — Progress Notes (Unsigned)
Patient ID: [name]                 DOB: 06/30/1985                    MRN: [id number]      HPI: [name] is a 37 y.o. male patient referred to lipid clinic by Dr. Izora Ribas. PMH is significant for insulin-dependent diabetes, hypertension, hyperlipidemia, and coronary artery disease. He was found to have a 100% LAD, indicative of a late-presenting myocardial infarction (2021). Latest LDL-C 218. TG 330. LDL-C 3 years ago 67. Suspect he has not been taking atorvastatin.   Compliance? Taking atorvastatin at time of labs? Medicaid? Does he needs to cancel exchange plan? Will need to repeat labs   Reviewed options for lowering LDL cholesterol, including ezetimibe, PCSK-9 inhibitors, bempedoic acid and inclisiran.  Discussed mechanisms of action, dosing, side effects and potential decreases in LDL cholesterol.  Also reviewed cost information and potential options for patient assistance.   Current Medications: atorvastatin 80mg  daily, ezetimbie 10mg  daily Intolerances:  Risk Factors: premature CAD, familiar hyperlipidemia LDL-C goal: <55 ApoB goal:   Diet:   Exercise:   Family History:   Social History:   Labs: Lipid Panel     Component Value Date/Time   CHOL 311 (H) 04/21/2023 1113   TRIG 330 (H) 04/21/2023 1113   HDL 24 (L) 04/21/2023 1113   CHOLHDL 13.0 (H) 04/21/2023 1113   CHOLHDL 6.4 01/29/2020 0731   VLDL 69 (H) 01/29/2020 0731   LDLCALC 218 (H) 04/21/2023 1113   LDLDIRECT 118.2 (H) 01/28/2020 1242   LABVLDL 69 (H) 04/21/2023 1113    Past Medical History:  Diagnosis Date   Coronary artery disease    Diabetes mellitus without complication (HCC)    Hypercholesteremia    Hypertension    Myocardial infarction (HCC) 01/2020    Current Outpatient Medications on File Prior to Visit  Medication Sig Dispense Refill   aspirin EC 81 MG tablet Take 81 mg by mouth daily. Swallow whole.     atorvastatin (LIPITOR) 80 MG tablet TAKE 1 TABLET BY MOUTH  EVERY DAY 90 tablet 0   carvedilol (COREG) 25 MG tablet TAKE 1 TABLET BY MOUTH TWICE A DAY IN THE MORNING AND IN THE EVENING 180 tablet 3   chlorthalidone (HYGROTON) 25 MG tablet Take 12.5 mg by mouth every morning.     ezetimibe (ZETIA) 10 MG tablet Take 1 tablet (10 mg total) by mouth daily. 90 tablet 3   hydrALAZINE (APRESOLINE) 50 MG tablet Take 1.5 tablets (75 mg total) by mouth 3 (three) times daily. 270 tablet 3   isosorbide dinitrate (ISORDIL) 20 MG tablet TAKE 1 TABLET BY MOUTH THREE TIMES A DAY 270 tablet 3   JARDIANCE 10 MG TABS tablet Take 10 mg by mouth daily.     LANTUS SOLOSTAR 100 UNIT/ML Solostar Pen Inject 50 Units into the skin at bedtime.     nitroGLYCERIN (NITROSTAT) 0.4 MG SL tablet Place 1 tablet (0.4 mg total) under the tongue every 5 (five) minutes as needed for chest pain. 90 tablet 3   NOVOLOG FLEXPEN 100 UNIT/ML FlexPen Inject 5-10 Units into the skin See admin instructions. Inject 5-10 units into the skin three times a day before meals, per sliding scale  0   torsemide (DEMADEX) 20 MG tablet Take 20 mg by mouth 2 (two) times daily.     No current facility-administered medications on file prior to visit.  No Known Allergies  Assessment/Plan:  1. Hyperlipidemia -  No problem-specific Assessment & Plan notes found for this encounter.    Thank you,  Olene Floss, Pharm.D, BCACP, BCPS, CPP [facility] [address] [place], [place] [address]  Phone: [phone]; Fax: [phone]

## 2023-05-19 ENCOUNTER — Ambulatory Visit: Payer: 59 | Attending: Cardiovascular Disease | Admitting: Pharmacist

## 2023-05-19 VITALS — Wt 290.6 lb

## 2023-05-19 DIAGNOSIS — E785 Hyperlipidemia, unspecified: Secondary | ICD-10-CM

## 2023-05-19 NOTE — Assessment & Plan Note (Addendum)
Assessment: LDL-C on last lipid panel was 218 which suggests familiar hyperlipidemia.   TG also elevated Patient was not on cholesterol medication at the time of labs Has since resumes atorvastatin 80mg  and added ezetimibe 10mg  Has made significant changes to diet - has lost 16lb in 1 month and has been able to come off of insulin. He reports fasting BG ~90. I commended patient for making these lifestyle changes. Asks about coming off some medications- reviewed that he wouldn't be able to stop cholesterol meds (with the exception of zetia is Repatha is added) or his HF meds, but if BP came down enough reduction or stopping of some other BP meds (chlorthalidone) may be possible.  Plan: Continue atorvastatin 80mg  and zetia 10mg  daily Repeat lipid panel in 1 month -most insurances require treatment with statin for 8 weeks If LDL-C is above goal of <55 will submit PA for Repatha Continue improved diet and add in exercise

## 2023-05-19 NOTE — Patient Instructions (Addendum)
Please come for labs in 1 month (beginning of Jan) I will call you after your labs and we will discuss adding Repatha Continue atorvastatin and ezetimibe daily Continue with changes in diet Add in exercise  Goal is to work up to 150 of moderate intensity exericse (brisk walking) And 2-3 days of resistance (weight training) Start where you are at and increase

## 2023-05-24 DIAGNOSIS — I5020 Unspecified systolic (congestive) heart failure: Secondary | ICD-10-CM | POA: Diagnosis not present

## 2023-05-24 DIAGNOSIS — N184 Chronic kidney disease, stage 4 (severe): Secondary | ICD-10-CM | POA: Diagnosis not present

## 2023-05-24 DIAGNOSIS — R319 Hematuria, unspecified: Secondary | ICD-10-CM | POA: Diagnosis not present

## 2023-05-24 DIAGNOSIS — I129 Hypertensive chronic kidney disease with stage 1 through stage 4 chronic kidney disease, or unspecified chronic kidney disease: Secondary | ICD-10-CM | POA: Diagnosis not present

## 2023-05-24 DIAGNOSIS — E1122 Type 2 diabetes mellitus with diabetic chronic kidney disease: Secondary | ICD-10-CM | POA: Diagnosis not present

## 2023-05-25 NOTE — Telephone Encounter (Signed)
Call placed to pt to check the status of him doing the Itamar.  Voicemail was full and couldn't leave a message.

## 2023-05-31 ENCOUNTER — Encounter (HOSPITAL_COMMUNITY): Payer: Self-pay | Admitting: Internal Medicine

## 2023-05-31 ENCOUNTER — Ambulatory Visit (HOSPITAL_COMMUNITY): Payer: 59 | Attending: Internal Medicine

## 2023-06-27 ENCOUNTER — Telehealth: Payer: Self-pay | Admitting: Pharmacist

## 2023-06-27 DIAGNOSIS — E785 Hyperlipidemia, unspecified: Secondary | ICD-10-CM

## 2023-06-27 NOTE — Telephone Encounter (Signed)
 Spoke with patient about getting labs done for insurance so that we can submit for Repatha. Also spoke with patient about talking with his kids pediatrician about getting his children's cholesterol screened.   Orders are in and he will go to the lab.

## 2023-07-13 ENCOUNTER — Other Ambulatory Visit: Payer: Self-pay | Admitting: Internal Medicine

## 2023-08-01 NOTE — Progress Notes (Deleted)
 Cardiology Office Note:  .   Date:  08/01/2023  ID:  [name], DOB 1986/06/11, MRN [id number] PCP: Soundra Pilon, FNP  [facility] Providers Cardiologist:  Lance Muss, MD { Click to update primary MD,subspecialty MD or APP then REFRESH:1}   Patient Profile: .      PMH Insulin-dependent diabetes Hypertension Hyperlipidemia/Possible FH Coronary artery disease Late presenting anterior MI 01/2020 pLAD 100% stenosis Successfully treated with 2.5 x 24 Synergy DES  LV 25-35% by visual estimate Chronic HFrEF 2D echo 01/29/2020 (post MI) LVEF 30-35%, moderate LVH, mid and distal anterior wall, mid and distal anterior septum, mid inferoseptal segment and apex akinetic, basal anteroseptal segment, basal anterior segment, and basal inferoseptal segment hypokinetic 2D echo 04/01/2020 LVEF 35-40%, mild LVH, G1DD, apical akinesis, hypokinesis of inferoseptum and apical anteroseptum, no significant valve disease Morbid obesity Anemia  He presented with late-presenting myocardial infarction in 2021 with an LVEF of 25 to 30% and LAD wall motion abnormalities.  He had been experiencing chest pain for a day prior to admission.  Upon arrival in ED, EKG demonstrated anterior Q waves and marked ST elevation.  Cath revealed 100% proximal LAD stenosis, successfully treated with DES, LVEF 25 to 35% by visualization.  2D echo revealed EF 30 to 35% with anterior/septal akinesis, normal RV function, small pericardial effusion. He was initially followed by Dr. Eldridge Dace.  Care was transferred to Dr. Raynelle Jan and he was seen in clinic on 04/21/2023.  He reported no recent chest pain or shortness of breath.  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 His wife reported snoring and apnea, but he felt that his sleeping had improved recently.  He was advised to stop Plavix and continue aspirin alone.   He was on GDMT for HFrEF including Jardiance, isosorbide dinitrate, hydralazine.  Hydralazine was increased to 75 mg 3 times daily due to elevated BP.  He was advised to undergo repeat echocardiogram. Lifestyle modification was emphasized. Iron studies were within normal limits.   He was referred to Pharm D and seen by Malena Peer, RPH on 05/19/2023 for consideration of PCSK9i therapy.  Most recent lipid levels revealed LDL-C of 218, triglycerides 330.  LDL-C had been 67 3 years prior.  He reported he had been out of atorvastatin for about 1 month prior to lab work.  He had resumed atorvastatin and had started taking ezetimibe as well.  He reported since changing his diet, he had lost 16 pounds and had not used insulin in 1 month.  He was advised to continue atorvastatin and ezetimibe along with lifestyle modification and plan for repeat lipid panel in 1 month. If LDL remained above goal of 55, would plan to start PCSK9i.        History of Present Illness: .   [name] is a *** 38 y.o. male ***   Discussed the use of AI scribe software for clinical note transcription with the patient, who gave verbal consent to proceed.   ROS: ***       Studies Reviewed: .        *** Risk Assessment/Calculations:   {Does this patient have ATRIAL FIBRILLATION?:[phone]} No BP recorded.  {Refresh Note OR Click here to enter BP  :1}***       Physical Exam:   VS:  There were no vitals taken for this visit.   Wt Readings from Last 3 Encounters:  05/19/23 290 lb 9.6 oz (131.8 kg)  04/21/23 (!) 306 lb 12.8 oz (139.2 kg)  02/24/22 300 lb (136.1 kg)    GEN: Well nourished, well developed in no acute distress NECK: No JVD; No carotid bruits CARDIAC: ***RRR, no murmurs, rubs, gallops RESPIRATORY:  Clear to auscultation without rales, wheezing or rhonchi  ABDOMEN: Soft, non-tender, non-distended EXTREMITIES:  No edema; No deformity     ASSESSMENT AND PLAN: .    CAD: Hyperlipidemia LDL goal <  70: Hypertension: IDDM:     {Are you ordering a CV Procedure (e.g. stress test, cath, DCCV, TEE, etc)?   Press F2        :[id number]}  Disposition:***  Signed, Eligha Bridegroom, NP-C

## 2023-08-03 ENCOUNTER — Encounter: Payer: Self-pay | Admitting: Pharmacist

## 2023-08-04 ENCOUNTER — Ambulatory Visit: Payer: 59 | Admitting: Nurse Practitioner

## 2023-08-19 ENCOUNTER — Encounter: Payer: Self-pay | Admitting: *Deleted

## 2023-08-19 NOTE — Telephone Encounter (Signed)
 Sent mychart 3/7

## 2023-09-13 NOTE — Progress Notes (Signed)
 Cardiology Office Note:    Date:  09/14/2023  ID:  [name], DOB 17-May-1986, MRN [id number] PCP: Soundra Pilon, FNP  [facility] Providers Cardiologist:  Christell Constant, MD       Patient Profile:      Coronary artery disease  Late presentation anterior MI s/p 2.5 x 24 mm DES to LAD in 01/2020 LHC 01/28/20: pLAD 100, RCA irregs (HFrEF) heart failure with reduced ejection fraction  Ischemic CM  Limited TTE 01/30/20: EF 40-45  TTE 04/01/20: apical AK, EF 35-40, mild (LVH) Left Ventricular Hypertrophy, Gr 1 DD, NL RVSF, trivial MR Diabetes mellitus, insulin dependent Chronic kidney disease, stage IV Neph: Dr. Valentino Nose Hypertension  Hyperlipidemia  LDL 04/2023: 218 Morbid obesity  Anemia         Discussed the use of AI scribe software for clinical note transcription with the patient, who gave verbal consent to proceed.  History of Present Illness [name] is a 38 y.o. male who returns for follow up of CAD, CHF. He is a prior patient of Dr. Eldridge Dace. He established with Dr. Izora Ribas in 04/2023. A follow up echocardiogram was ordered. However, the patient has not done this yet.   He is here with his wife.  He has not had chest discomfort, pressure, heaviness, shortness of breath, or swelling in the legs. He experiences dizziness with standing, particularly after taking his medications.  He has not had difficulty breathing when lying flat. He manages his diabetes independently and has not been using insulin recently, as his morning blood glucose levels have been within the range of 90 to 100 mg/dL. He has stopped eating carbohydrates, which he believes has helped manage his blood glucose levels. He reports a significant weight loss of approximately 80 pounds, which he believes has positively impacted his blood pressure. At home, his blood pressure typically ranges from 120/80 mmHg to 140/80 mmHg.   Review of Systems  Gastrointestinal:  Negative for  hematochezia and melena.  -See HPI    Studies Reviewed:       Results    Risk Assessment/Calculations:             Physical Exam:   VS:  BP 120/78   Pulse 88   Ht 6\' 4"  (1.93 m)   Wt 274 lb 6.4 oz (124.5 kg)   SpO2 98%   BMI 33.40 kg/m    Wt Readings from Last 3 Encounters:  09/14/23 274 lb 6.4 oz (124.5 kg)  05/19/23 290 lb 9.6 oz (131.8 kg)  04/21/23 (!) 306 lb 12.8 oz (139.2 kg)    Constitutional:      Appearance: Healthy appearance. Not in distress.  Neck:     Vascular: JVD normal.  Pulmonary:     Breath sounds: Normal breath sounds. No wheezing. No rales.  Cardiovascular:     Normal rate. Regular rhythm.     Murmurs: There is no murmur.  Edema:    Peripheral edema absent.  Abdominal:     Palpations: Abdomen is soft.        Assessment and Plan:   Assessment & Plan HFrEF (heart failure with reduced ejection fraction) (HCC) Ischemic cardiomyopathy.  EF 35-40 by echocardiogram October 2021.  Volume status stable.  He is NYHA I-II.  He is not on ACE/ARB/ARNI or MRA secondary to chronic kidney disease.  Blood pressure is well-controlled.  He sometimes has lightheaded spells when he stands.  Of asked him to monitor his blood pressure during  these.  If his blood pressure is running low, we can consider reducing some of his medications.  He has lost about 80 pounds total which is contributing to improved blood pressure control. -Continue carvedilol 25 mg twice daily, Jardiance 10 mg daily, hydralazine 75 mg 3 times a day, isosorbide dinitrate 20 mg 3 times a day, torsemide 20 mg twice daily -Reschedule echocardiogram to recheck EF -If EF < 35, refer to EP for ICD -Follow-up 6 months Coronary artery disease involving native coronary artery of native heart with angina pectoris [facility]) S/p late presentation myocardial infarction in August 2021 treated with a drug-eluting stent to the left anterior descending artery. Currently asymptomatic with no chest discomfort, pressure,  or heaviness. - Continue aspirin 81 mg daily - Continue Lipitor 80 mg daily - Continue carvedilol 25 mg twice daily - Continue isosorbide dinitrate 20 mg three times daily Essential hypertension Blood pressure readings at home are typically within normal range.  Blood pressure today is optimal.  Dizziness reported post-medication intake, possibly related to blood pressure medications. Significant weight loss of 80 pounds noted, which has improved blood pressure control.  - Monitor home blood pressure readings, especially during episodes of dizziness - Continue carvedilol 25 mg twice daily, hydralazine 75 mg 3 times a day, isosorbide dinitrate 20 mg 3 times a day Hyperlipidemia with target low density lipoprotein (LDL) cholesterol less than 55 mg/dL LDL cholesterol has been as high as 218 mg/dL in the past.   - Continue Lipitor 80 mg daily - Continue Zetia 10 mg daily - Order lipid panel, LFTs - Consider PCSK9 inhibitor if LDL >55 CKD (chronic kidney disease) stage 4, GFR 15-29 ml/min (HCC) He is followed by Nephrology and notes he is nearing dialysis.  Insulin-requiring or dependent type II diabetes mellitus (HCC) He has lost 80 lbs and has stopped using insulin. His sugars have been fairly normal. He would like to get his A1c checked today. -Check A1c     Dispo:  Return in about 6 months (around 03/15/2024) for Routine Follow Up, w/ Dr. Izora Ribas, or Tereso Newcomer, PA-C.  Signed, Tereso Newcomer, PA-C

## 2023-09-14 ENCOUNTER — Encounter: Payer: Self-pay | Admitting: Physician Assistant

## 2023-09-14 ENCOUNTER — Ambulatory Visit: Payer: 59 | Attending: Physician Assistant | Admitting: Physician Assistant

## 2023-09-14 VITALS — BP 120/78 | HR 88 | Ht 76.0 in | Wt 274.4 lb

## 2023-09-14 DIAGNOSIS — E119 Type 2 diabetes mellitus without complications: Secondary | ICD-10-CM | POA: Diagnosis not present

## 2023-09-14 DIAGNOSIS — I10 Essential (primary) hypertension: Secondary | ICD-10-CM | POA: Diagnosis not present

## 2023-09-14 DIAGNOSIS — N184 Chronic kidney disease, stage 4 (severe): Secondary | ICD-10-CM

## 2023-09-14 DIAGNOSIS — E785 Hyperlipidemia, unspecified: Secondary | ICD-10-CM | POA: Diagnosis not present

## 2023-09-14 DIAGNOSIS — I25119 Atherosclerotic heart disease of native coronary artery with unspecified angina pectoris: Secondary | ICD-10-CM

## 2023-09-14 DIAGNOSIS — I5020 Unspecified systolic (congestive) heart failure: Secondary | ICD-10-CM | POA: Diagnosis not present

## 2023-09-14 DIAGNOSIS — Z794 Long term (current) use of insulin: Secondary | ICD-10-CM

## 2023-09-14 NOTE — Assessment & Plan Note (Addendum)
 He is followed by Nephrology and notes he is nearing dialysis.

## 2023-09-14 NOTE — Patient Instructions (Addendum)
 Medication Instructions:  Your physician recommends that you continue on your current medications as directed. Please refer to the Current Medication list given to you today.  *If you need a refill on your cardiac medications before your next appointment, please call your pharmacy*  Lab Work: TODAY:  BMET, LFT, LIPID, & HGBA1C  If you have labs (blood work) drawn today and your tests are completely normal, you will receive your results only by: MyChart Message (if you have MyChart) OR A paper copy in the mail If you have any lab test that is abnormal or we need to change your treatment, we will call you to review the results.  Testing/Procedures: None ordered today but you do need to reschedule your Echocardiogram   Follow-Up: At [facility], you and your health needs are our priority.  As part of our continuing mission to provide you with exceptional heart care, our providers are all part of one team.  This team includes your primary Cardiologist (physician) and Advanced Practice Providers or APPs (Physician Assistants and Nurse Practitioners) who all work together to provide you with the care you need, when you need it.  Your next appointment:   6 month(s)  Provider:   Riley Lam, MD  or Tereso Newcomer, PA-C         We recommend signing up for the patient portal called "MyChart".  Sign up information is provided on this After Visit Summary.  MyChart is used to connect with patients for Virtual Visits (Telemedicine).  Patients are able to view lab/test results, encounter notes, upcoming appointments, etc.  Non-urgent messages can be sent to your provider as well.   To learn more about what you can do with MyChart, go to [email or web address].   Other Instructions The Pin # for the [place] Sleep Study is [phone].  You may proceed with this study.      1st Floor: - Lobby - Registration  - Pharmacy  - Lab - Cafe  2nd Floor: - PV Lab - Diagnostic Testing (echo,  CT, nuclear med)  3rd Floor: - Vacant  4th Floor: - TCTS (cardiothoracic surgery) - AFib Clinic - Structural Heart Clinic - Vascular Surgery  - Vascular Ultrasound  5th Floor: - HeartCare Cardiology (general and EP) - Clinical Pharmacy for coumadin, hypertension, lipid, weight-loss medications, and med management appointments    Valet parking services will be available as well.

## 2023-09-14 NOTE — Telephone Encounter (Signed)
 Ordering provider: DR Izora Ribas Associated diagnoses: I10 WatchPAT PA obtained on 09/14/2023 by Latrelle Dodrill, CMA. Authorization: No; tracking ID NO PA REQ Patient notified of PIN ([phone]) on 09/14/2023 via Notification Method: in person.

## 2023-09-14 NOTE — Assessment & Plan Note (Signed)
 Ischemic cardiomyopathy.  EF 35-40 by echocardiogram October 2021.  Volume status stable.  He is NYHA I-II.  He is not on ACE/ARB/ARNI or MRA secondary to chronic kidney disease.  Blood pressure is well-controlled.  He sometimes has lightheaded spells when he stands.  Of asked him to monitor his blood pressure during these.  If his blood pressure is running low, we can consider reducing some of his medications.  He has lost about 80 pounds total which is contributing to improved blood pressure control. -Continue carvedilol 25 mg twice daily, Jardiance 10 mg daily, hydralazine 75 mg 3 times a day, isosorbide dinitrate 20 mg 3 times a day, torsemide 20 mg twice daily -Reschedule echocardiogram to recheck EF -If EF < 35, refer to EP for ICD -Follow-up 6 months

## 2023-09-14 NOTE — Assessment & Plan Note (Signed)
 S/p late presentation myocardial infarction in August 2021 treated with a drug-eluting stent to the left anterior descending artery. Currently asymptomatic with no chest discomfort, pressure, or heaviness. - Continue aspirin 81 mg daily - Continue Lipitor 80 mg daily - Continue carvedilol 25 mg twice daily - Continue isosorbide dinitrate 20 mg three times daily

## 2023-09-14 NOTE — Telephone Encounter (Signed)
 Pt came in for appointment today with [name].  Noticed auth for Donnie Coffin was never completed since November.  Had Veda Canning, CMA, look up and authorization not required.  Will provide pt with pin # at appointment.

## 2023-09-14 NOTE — Assessment & Plan Note (Signed)
 Blood pressure readings at home are typically within normal range.  Blood pressure today is optimal.  Dizziness reported post-medication intake, possibly related to blood pressure medications. Significant weight loss of 80 pounds noted, which has improved blood pressure control.  - Monitor home blood pressure readings, especially during episodes of dizziness - Continue carvedilol 25 mg twice daily, hydralazine 75 mg 3 times a day, isosorbide dinitrate 20 mg 3 times a day

## 2023-09-14 NOTE — Assessment & Plan Note (Signed)
 LDL cholesterol has been as high as 218 mg/dL in the past.   - Continue Lipitor 80 mg daily - Continue Zetia 10 mg daily - Order lipid panel, LFTs - Consider PCSK9 inhibitor if LDL >55

## 2023-09-14 NOTE — Assessment & Plan Note (Signed)
 He has lost 80 lbs and has stopped using insulin. His sugars have been fairly normal. He would like to get his A1c checked today. -Check A1c

## 2023-09-15 ENCOUNTER — Ambulatory Visit (INDEPENDENT_AMBULATORY_CARE_PROVIDER_SITE_OTHER)

## 2023-09-15 ENCOUNTER — Ambulatory Visit: Admitting: Podiatry

## 2023-09-15 ENCOUNTER — Encounter: Payer: Self-pay | Admitting: Podiatry

## 2023-09-15 DIAGNOSIS — B351 Tinea unguium: Secondary | ICD-10-CM

## 2023-09-15 DIAGNOSIS — L97521 Non-pressure chronic ulcer of other part of left foot limited to breakdown of skin: Secondary | ICD-10-CM | POA: Diagnosis not present

## 2023-09-15 DIAGNOSIS — E1149 Type 2 diabetes mellitus with other diabetic neurological complication: Secondary | ICD-10-CM

## 2023-09-15 DIAGNOSIS — M79675 Pain in left toe(s): Secondary | ICD-10-CM | POA: Diagnosis not present

## 2023-09-15 DIAGNOSIS — M79674 Pain in right toe(s): Secondary | ICD-10-CM | POA: Diagnosis not present

## 2023-09-15 MED ORDER — DOXYCYCLINE HYCLATE 100 MG PO TABS: 100.0000 mg | ORAL_TABLET | Freq: Two times a day (BID) | ORAL | 0 refills | Status: DC

## 2023-09-16 ENCOUNTER — Telehealth: Payer: Self-pay | Admitting: *Deleted

## 2023-09-16 NOTE — Telephone Encounter (Signed)
-----   Message from Tereso Newcomer sent at 09/16/2023  1:09 PM EDT ----- Results sent to Marney Setting via MyChart. See MyChart comments below.  I will send a copy to PCP as FYI. PLAN:  - Please fax a copy to his nephrologist - Stop Jardiance  Mr. Vora  Your creatinine (kidney function) is overall worse.  Continue close follow-up with your nephrologist.  Because of how low your kidney function is, we should hold your Jardiance for now. I will send a copy of your labs to your nephrologist.  Your potassium is normal.  Your cholesterol is optimal.  LDL should be less than 55 and it is 22, which is excellent.  Your liver enzymes (AST, ALT) are normal.  The hemoglobin A1c is still pending. Tereso Newcomer, PA-C

## 2023-09-17 LAB — LIPID PANEL
Chol/HDL Ratio: 2.4 ratio (ref 0.0–5.0)
Cholesterol, Total: 70 mg/dL — ABNORMAL LOW (ref 100–199)
HDL: 29 mg/dL — ABNORMAL LOW (ref >39)
LDL Chol Calc (NIH): 22 mg/dL (ref 0–99)
Triglycerides: 94 mg/dL (ref 0–149)
VLDL Cholesterol Cal: 19 mg/dL (ref 5–40)

## 2023-09-17 LAB — BASIC METABOLIC PANEL WITH GFR
BUN/Creatinine Ratio: 11 (ref 9–20)
BUN: 74 mg/dL — ABNORMAL HIGH (ref 6–24)
CO2: 18 mmol/L — ABNORMAL LOW (ref 20–29)
Calcium: 8.9 mg/dL (ref 8.7–10.2)
Chloride: 108 mmol/L — ABNORMAL HIGH (ref 96–106)
Creatinine, Ser: 6.63 mg/dL — ABNORMAL HIGH (ref 0.76–1.27)
Glucose: 126 mg/dL — ABNORMAL HIGH (ref 70–99)
Potassium: 4.1 mmol/L (ref 3.5–5.2)
Sodium: 144 mmol/L (ref 134–144)
eGFR: 10 mL/min/{1.73_m2} — ABNORMAL LOW (ref >59)

## 2023-09-17 LAB — HEPATIC FUNCTION PANEL
ALT: 15 IU/L (ref 0–44)
AST: 16 IU/L (ref 0–40)
Albumin: 4.2 g/dL (ref 4.1–5.1)
Alkaline Phosphatase: 111 IU/L (ref 44–121)
Bilirubin Total: 0.5 mg/dL (ref 0.0–1.2)
Bilirubin, Direct: 0.17 mg/dL (ref 0.00–0.40)
Total Protein: 6.7 g/dL (ref 6.0–8.5)

## 2023-09-17 LAB — HEMOGLOBIN A1C
Est. average glucose Bld gHb Est-mCnc: 108 mg/dL
Hgb A1c MFr Bld: 5.4 % (ref 4.8–5.6)

## 2023-09-18 NOTE — Progress Notes (Signed)
 Subjective:  Patient ID: [name], male    DOB: [birth year],  MRN: [id number]  Chief Complaint  Patient presents with   Nail Problem    RM#13 Foot need to be examined not sure what's wrong, left toes 1-3, hallux is black, has open wounds, Diabetic, hx of amputations ,numbness and tingling left foot ongoing for a month.    Discussed the use of AI scribe software for clinical note transcription with the patient, who gave verbal consent to proceed.  History of Present Illness The patient, with a history of diabetes, presents with foot wounds that he first noticed approximately a month ago. He denies any changes in shoes or activities around the time the wounds developed. He saw a podiatrist in October who recommended wider shoes, which he has been wearing since. The right foot was initially affected, with the left foot developing similar wounds more recently. He denies any drainage or fluid from the wounds. He has experienced chills in the last couple of days. He reports good control of his diabetes, with a recent A1c of 7. He also reports numbness and tingling in the left foot, which he attributes to neuropathy.      Objective:    Physical Exam General: AAO x3, NAD  Dermatological: As pictured below on the left foot there is what appears to be dried blood blisters present at the toes.  Once they have debrided this is new, healthy, pink skin present underneath the areas unable to debride today.  There is no purulence.  There is no fluctuation or cavitation.  No malodor.  Nails are also hypertrophic and dystrophic with yellow, brown discoloration and subungual debris is present.  He gets tenderness nails 1-5 bilaterally, except for the left 2nd toe which has been amputated.   Vascular: Dorsalis Pedis artery and Posterior Tibial artery pedal pulses are palpable bilateral with immedate capillary fill time.  There is no pain with calf compression, swelling, warmth, erythema.    Neruologic: Grossly intact via light touch bilateral.    Musculoskeletal: No pain on exam. 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           Results Procedure: Wound debridement Description: Debrided the area of peeling skin, revealing healthy pink skin underneath the toenail.  Procedure: Toenail trimming Description: Trimmed the toenail that was partially detached and filed down rough edges.  LABS HbA1c: 7 (09/14/2023)   Assessment:   1. Toe ulcer, left, limited to breakdown of skin (HCC)   2. Type II diabetes mellitus with neurological manifestations [facility])      Plan:  Patient was evaluated and treated and all questions answered.  Assessment and Plan Assessment & Plan Foot wounds Wounds on left foot with possible infection indicated by nocturnal chills. Numbness and tingling suggest neuropathy or circulation issues. Weaker posterior foot pulses noted. - X-rays were obtained.  Multiple views were obtained.  No cortical destruction suggest osteomyelitis at this time..  Second dilatation.  Vessel calcification present. - Order ABI to evaluate blood flow. - Prescribe doxycycline for potential infection. - Advise wearing open-toed shoes to reduce pressure.   Symptomatic onychomycosis -Sharply debrided nails times down without any complications or bleeding  Peripheral neuropathy Numbness and tingling in left foot, possibly due to diabetic neuropathy or circulation issues. - Order circulation test to assess blood flow and rule out vascular causes.  Diabetes Mellitus Diabetes well-controlled with previous A1c of 7. Foot issues may relate to diabetic complications. - Monitor A1c results when available. -  Consider diabetic shoes to prevent further foot complications.    Return in about 2 weeks (around 09/29/2023).    Vivi Barrack DPM

## 2023-10-12 DIAGNOSIS — R319 Hematuria, unspecified: Secondary | ICD-10-CM | POA: Diagnosis not present

## 2023-10-12 DIAGNOSIS — I129 Hypertensive chronic kidney disease with stage 1 through stage 4 chronic kidney disease, or unspecified chronic kidney disease: Secondary | ICD-10-CM | POA: Diagnosis not present

## 2023-10-12 DIAGNOSIS — E1122 Type 2 diabetes mellitus with diabetic chronic kidney disease: Secondary | ICD-10-CM | POA: Diagnosis not present

## 2023-10-12 DIAGNOSIS — N184 Chronic kidney disease, stage 4 (severe): Secondary | ICD-10-CM | POA: Diagnosis not present

## 2023-10-12 DIAGNOSIS — I5020 Unspecified systolic (congestive) heart failure: Secondary | ICD-10-CM | POA: Diagnosis not present

## 2023-10-13 ENCOUNTER — Ambulatory Visit (HOSPITAL_COMMUNITY): Attending: Internal Medicine

## 2023-10-17 ENCOUNTER — Encounter (HOSPITAL_COMMUNITY): Payer: Self-pay | Admitting: Internal Medicine

## 2023-11-01 ENCOUNTER — Encounter (HOSPITAL_COMMUNITY)
Admission: RE | Admit: 2023-11-01 | Discharge: 2023-11-01 | Disposition: A | Discharge status: Home / Self Care | Source: Ambulatory Visit | Attending: Internal Medicine | Admitting: Internal Medicine

## 2023-11-01 VITALS — BP 158/89 | HR 73 | Temp 97.9°F | Resp 18

## 2023-11-01 DIAGNOSIS — D631 Anemia in chronic kidney disease: Secondary | ICD-10-CM | POA: Insufficient documentation

## 2023-11-01 DIAGNOSIS — N184 Chronic kidney disease, stage 4 (severe): Secondary | ICD-10-CM | POA: Insufficient documentation

## 2023-11-01 LAB — POCT HEMOGLOBIN-HEMACUE: Hemoglobin: 8.3 g/dL — ABNORMAL LOW (ref 13.0–17.0)

## 2023-11-01 MED ORDER — EPOETIN ALFA-EPBX 10000 UNIT/ML IJ SOLN
INTRAMUSCULAR | Status: AC
  Filled 2023-11-01: qty 1

## 2023-11-01 MED ORDER — EPOETIN ALFA-EPBX 10000 UNIT/ML IJ SOLN
10000.0000 [IU] | INTRAMUSCULAR | Status: DC
  Administered 2023-11-01: 10000 [IU] via SUBCUTANEOUS

## 2023-11-09 ENCOUNTER — Telehealth (HOSPITAL_COMMUNITY): Payer: Self-pay

## 2023-11-09 NOTE — Telephone Encounter (Signed)
 Attempted to contact the patient to schedule VAS US .  No answer.  Left message.  Third Attempt. Provided  direct contact number for scheduling: [phone].  Attempted 3 times to contact patient to schedule requested appointment from referring provider, patient has not responded to these attempts. Order to be cancelled in 1 week if no response is made by 11/16/23

## 2023-11-10 ENCOUNTER — Telehealth: Payer: Self-pay

## 2023-11-10 NOTE — Telephone Encounter (Signed)
 Auth Submission: NO AUTH NEEDED Site of care: Site of care: [facility] Payer: [employer] [place] [employer] Medication & CPT/J Code(s) submitted: Retacrit Route of submission (phone, fax, portal):  Phone # Fax # Auth type: Buy/Bill PB Units/visits requested: 10000 units every 2 weeks Reference number:  Approval from: 11/10/23 to 06/13/24

## 2023-11-14 ENCOUNTER — Other Ambulatory Visit: Payer: Self-pay | Admitting: Internal Medicine

## 2023-11-15 ENCOUNTER — Ambulatory Visit (HOSPITAL_COMMUNITY)
Admission: RE | Admit: 2023-11-15 | Discharge: 2023-11-15 | Disposition: A | Discharge status: Home / Self Care | Source: Ambulatory Visit | Attending: Internal Medicine | Admitting: Internal Medicine

## 2023-11-15 VITALS — BP 165/93 | HR 72 | Temp 97.2°F | Resp 17

## 2023-11-15 DIAGNOSIS — D631 Anemia in chronic kidney disease: Secondary | ICD-10-CM | POA: Diagnosis present

## 2023-11-15 DIAGNOSIS — N184 Chronic kidney disease, stage 4 (severe): Secondary | ICD-10-CM | POA: Diagnosis present

## 2023-11-15 LAB — POCT HEMOGLOBIN-HEMACUE: Hemoglobin: 8.2 g/dL — ABNORMAL LOW (ref 13.0–17.0)

## 2023-11-15 MED ORDER — EPOETIN ALFA-EPBX 10000 UNIT/ML IJ SOLN
INTRAMUSCULAR | Status: AC
  Filled 2023-11-15: qty 1

## 2023-11-15 MED ORDER — EPOETIN ALFA-EPBX 10000 UNIT/ML IJ SOLN
10000.0000 [IU] | INTRAMUSCULAR | Status: DC
  Administered 2023-11-15: 10000 [IU] via SUBCUTANEOUS

## 2023-11-29 ENCOUNTER — Encounter (HOSPITAL_COMMUNITY): Payer: Self-pay

## 2023-11-29 ENCOUNTER — Inpatient Hospital Stay (HOSPITAL_COMMUNITY)
Admission: RE | Admit: 2023-11-29 | Discharge: 2023-11-29 | Disposition: A | Discharge status: Home / Self Care | Source: Ambulatory Visit | Attending: Internal Medicine | Admitting: Internal Medicine

## 2023-12-11 ENCOUNTER — Other Ambulatory Visit: Payer: Self-pay | Admitting: Internal Medicine

## 2023-12-13 ENCOUNTER — Encounter (HOSPITAL_COMMUNITY)

## 2024-01-05 ENCOUNTER — Ambulatory Visit (HOSPITAL_COMMUNITY): Admission: RE | Admit: 2024-01-05 | Source: Ambulatory Visit

## 2024-01-08 ENCOUNTER — Inpatient Hospital Stay (HOSPITAL_COMMUNITY)
Admission: EM | Admit: 2024-01-08 | Discharge: 2024-01-11 | DRG: 291 | Disposition: A | Discharge status: Home / Self Care | Attending: Internal Medicine | Admitting: Internal Medicine

## 2024-01-08 ENCOUNTER — Other Ambulatory Visit: Payer: Self-pay

## 2024-01-08 ENCOUNTER — Emergency Department (HOSPITAL_COMMUNITY)

## 2024-01-08 DIAGNOSIS — Z955 Presence of coronary angioplasty implant and graft: Secondary | ICD-10-CM

## 2024-01-08 DIAGNOSIS — D631 Anemia in chronic kidney disease: Secondary | ICD-10-CM | POA: Diagnosis present

## 2024-01-08 DIAGNOSIS — Z833 Family history of diabetes mellitus: Secondary | ICD-10-CM

## 2024-01-08 DIAGNOSIS — I252 Old myocardial infarction: Secondary | ICD-10-CM

## 2024-01-08 DIAGNOSIS — I255 Ischemic cardiomyopathy: Secondary | ICD-10-CM | POA: Diagnosis present

## 2024-01-08 DIAGNOSIS — R0602 Shortness of breath: Secondary | ICD-10-CM | POA: Diagnosis present

## 2024-01-08 DIAGNOSIS — R079 Chest pain, unspecified: Principal | ICD-10-CM

## 2024-01-08 DIAGNOSIS — E66811 Obesity, class 1: Secondary | ICD-10-CM | POA: Diagnosis present

## 2024-01-08 DIAGNOSIS — Z794 Long term (current) use of insulin: Secondary | ICD-10-CM

## 2024-01-08 DIAGNOSIS — I2510 Atherosclerotic heart disease of native coronary artery without angina pectoris: Secondary | ICD-10-CM | POA: Diagnosis present

## 2024-01-08 DIAGNOSIS — I3139 Other pericardial effusion (noninflammatory): Secondary | ICD-10-CM | POA: Diagnosis not present

## 2024-01-08 DIAGNOSIS — E119 Type 2 diabetes mellitus without complications: Secondary | ICD-10-CM | POA: Diagnosis not present

## 2024-01-08 DIAGNOSIS — I5043 Acute on chronic combined systolic (congestive) and diastolic (congestive) heart failure: Secondary | ICD-10-CM | POA: Diagnosis present

## 2024-01-08 DIAGNOSIS — I10 Essential (primary) hypertension: Secondary | ICD-10-CM | POA: Diagnosis not present

## 2024-01-08 DIAGNOSIS — E7800 Pure hypercholesterolemia, unspecified: Secondary | ICD-10-CM | POA: Diagnosis present

## 2024-01-08 DIAGNOSIS — Z79899 Other long term (current) drug therapy: Secondary | ICD-10-CM

## 2024-01-08 DIAGNOSIS — D649 Anemia, unspecified: Secondary | ICD-10-CM | POA: Diagnosis present

## 2024-01-08 DIAGNOSIS — I132 Hypertensive heart and chronic kidney disease with heart failure and with stage 5 chronic kidney disease, or end stage renal disease: Secondary | ICD-10-CM | POA: Diagnosis not present

## 2024-01-08 DIAGNOSIS — Z6834 Body mass index (BMI) 34.0-34.9, adult: Secondary | ICD-10-CM

## 2024-01-08 DIAGNOSIS — Z7982 Long term (current) use of aspirin: Secondary | ICD-10-CM

## 2024-01-08 DIAGNOSIS — R0789 Other chest pain: Secondary | ICD-10-CM | POA: Diagnosis not present

## 2024-01-08 DIAGNOSIS — I5020 Unspecified systolic (congestive) heart failure: Secondary | ICD-10-CM | POA: Diagnosis not present

## 2024-01-08 DIAGNOSIS — R5381 Other malaise: Secondary | ICD-10-CM | POA: Diagnosis present

## 2024-01-08 DIAGNOSIS — E785 Hyperlipidemia, unspecified: Secondary | ICD-10-CM | POA: Diagnosis not present

## 2024-01-08 DIAGNOSIS — E1122 Type 2 diabetes mellitus with diabetic chronic kidney disease: Secondary | ICD-10-CM | POA: Diagnosis present

## 2024-01-08 DIAGNOSIS — N185 Chronic kidney disease, stage 5: Secondary | ICD-10-CM | POA: Diagnosis not present

## 2024-01-08 DIAGNOSIS — N179 Acute kidney failure, unspecified: Secondary | ICD-10-CM | POA: Diagnosis present

## 2024-01-08 DIAGNOSIS — Z8249 Family history of ischemic heart disease and other diseases of the circulatory system: Secondary | ICD-10-CM

## 2024-01-08 DIAGNOSIS — I160 Hypertensive urgency: Secondary | ICD-10-CM | POA: Diagnosis present

## 2024-01-08 DIAGNOSIS — I517 Cardiomegaly: Secondary | ICD-10-CM

## 2024-01-08 DIAGNOSIS — Z1152 Encounter for screening for COVID-19: Secondary | ICD-10-CM

## 2024-01-08 LAB — COMPREHENSIVE METABOLIC PANEL WITH GFR
ALT: 28 U/L (ref 0–44)
AST: 17 U/L (ref 15–41)
Albumin: 3.5 g/dL (ref 3.5–5.0)
Alkaline Phosphatase: 86 U/L (ref 38–126)
Anion gap: 12 (ref 5–15)
BUN: 68 mg/dL — ABNORMAL HIGH (ref 6–20)
CO2: 20 mmol/L — ABNORMAL LOW (ref 22–32)
Calcium: 9 mg/dL (ref 8.9–10.3)
Chloride: 111 mmol/L (ref 98–111)
Creatinine, Ser: 6.61 mg/dL — ABNORMAL HIGH (ref 0.61–1.24)
GFR, Estimated: 10 mL/min — ABNORMAL LOW (ref >60)
Glucose, Bld: 134 mg/dL — ABNORMAL HIGH (ref 70–99)
Potassium: 4.1 mmol/L (ref 3.5–5.1)
Sodium: 143 mmol/L (ref 135–145)
Total Bilirubin: 1.3 mg/dL — ABNORMAL HIGH (ref 0.0–1.2)
Total Protein: 7 g/dL (ref 6.5–8.1)

## 2024-01-08 LAB — MAGNESIUM: Magnesium: 2.7 mg/dL — ABNORMAL HIGH (ref 1.7–2.4)

## 2024-01-08 LAB — CBC WITH DIFFERENTIAL/PLATELET
Abs Immature Granulocytes: 0.02 K/uL (ref 0.00–0.07)
Basophils Absolute: 0.1 K/uL (ref 0.0–0.1)
Basophils Relative: 1 %
Eosinophils Absolute: 0.1 K/uL (ref 0.0–0.5)
Eosinophils Relative: 2 %
HCT: 24.4 % — ABNORMAL LOW (ref 39.0–52.0)
Hemoglobin: 7.4 g/dL — ABNORMAL LOW (ref 13.0–17.0)
Immature Granulocytes: 0 %
Lymphocytes Relative: 16 %
Lymphs Abs: 1.1 K/uL (ref 0.7–4.0)
MCH: 28.6 pg (ref 26.0–34.0)
MCHC: 30.3 g/dL (ref 30.0–36.0)
MCV: 94.2 fL (ref 80.0–100.0)
Monocytes Absolute: 0.5 K/uL (ref 0.1–1.0)
Monocytes Relative: 7 %
Neutro Abs: 5 K/uL (ref 1.7–7.7)
Neutrophils Relative %: 74 %
Platelets: 144 K/uL — ABNORMAL LOW (ref 150–400)
RBC: 2.59 MIL/uL — ABNORMAL LOW (ref 4.22–5.81)
RDW: 13.8 % (ref 11.5–15.5)
WBC: 6.7 K/uL (ref 4.0–10.5)
nRBC: 0 % (ref 0.0–0.2)

## 2024-01-08 LAB — RESP PANEL BY RT-PCR (RSV, FLU A&B, COVID)  RVPGX2
Influenza A by PCR: NEGATIVE
Influenza B by PCR: NEGATIVE
Resp Syncytial Virus by PCR: NEGATIVE
SARS Coronavirus 2 by RT PCR: NEGATIVE

## 2024-01-08 LAB — PREPARE RBC (CROSSMATCH)

## 2024-01-08 LAB — ABO/RH: ABO/RH(D): O NEG

## 2024-01-08 LAB — TROPONIN I (HIGH SENSITIVITY)
Troponin I (High Sensitivity): 14 ng/L (ref <18)
Troponin I (High Sensitivity): 15 ng/L (ref <18)

## 2024-01-08 LAB — BRAIN NATRIURETIC PEPTIDE: B Natriuretic Peptide: 698.2 pg/mL — ABNORMAL HIGH (ref 0.0–100.0)

## 2024-01-08 LAB — CBG MONITORING, ED: Glucose-Capillary: 120 mg/dL — ABNORMAL HIGH (ref 70–99)

## 2024-01-08 MED ORDER — BUTALBITAL-APAP-CAFFEINE 50-325-40 MG PO TABS
2.0000 | ORAL_TABLET | Freq: Four times a day (QID) | ORAL | Status: AC | PRN
  Administered 2024-01-08 – 2024-01-09 (×2): 2 via ORAL
  Filled 2024-01-08 (×2): qty 2

## 2024-01-08 MED ORDER — HEPARIN SODIUM (PORCINE) 5000 UNIT/ML IJ SOLN
5000.0000 [IU] | Freq: Three times a day (TID) | INTRAMUSCULAR | Status: DC
  Administered 2024-01-08 – 2024-01-11 (×8): 5000 [IU] via SUBCUTANEOUS
  Filled 2024-01-08 (×8): qty 1

## 2024-01-08 MED ORDER — SODIUM CHLORIDE 0.9% FLUSH
3.0000 mL | Freq: Two times a day (BID) | INTRAVENOUS | Status: DC
  Administered 2024-01-08 – 2024-01-10 (×5): 3 mL via INTRAVENOUS

## 2024-01-08 MED ORDER — HYDRALAZINE HCL 25 MG PO TABS
75.0000 mg | ORAL_TABLET | Freq: Once | ORAL | Status: AC
  Administered 2024-01-08: 75 mg via ORAL
  Filled 2024-01-08: qty 3

## 2024-01-08 MED ORDER — SODIUM CHLORIDE 0.9% IV SOLUTION: Freq: Once | INTRAVENOUS | Status: DC

## 2024-01-08 MED ORDER — HYDRALAZINE HCL 20 MG/ML IJ SOLN
10.0000 mg | INTRAMUSCULAR | Status: DC | PRN
  Administered 2024-01-08 – 2024-01-09 (×2): 10 mg via INTRAVENOUS
  Filled 2024-01-08 (×2): qty 1

## 2024-01-08 MED ORDER — POLYETHYLENE GLYCOL 3350 17 G PO PACK
17.0000 g | PACK | Freq: Every day | ORAL | Status: DC | PRN
Start: 2024-01-08 — End: 2024-01-11

## 2024-01-08 MED ORDER — ACETAMINOPHEN 650 MG RE SUPP: 650.0000 mg | Freq: Four times a day (QID) | RECTAL | Status: DC | PRN

## 2024-01-08 MED ORDER — HYDRALAZINE HCL 20 MG/ML IJ SOLN
5.0000 mg | Freq: Four times a day (QID) | INTRAMUSCULAR | Status: DC | PRN
  Administered 2024-01-08: 5 mg via INTRAVENOUS
  Filled 2024-01-08: qty 1

## 2024-01-08 MED ORDER — INSULIN ASPART 100 UNIT/ML IJ SOLN
0.0000 [IU] | Freq: Three times a day (TID) | INTRAMUSCULAR | Status: DC
  Administered 2024-01-09 – 2024-01-10 (×3): 2 [IU] via SUBCUTANEOUS
  Filled 2024-01-08: qty 0.15

## 2024-01-08 MED ORDER — ACETAMINOPHEN 325 MG PO TABS
650.0000 mg | ORAL_TABLET | Freq: Four times a day (QID) | ORAL | Status: DC | PRN
  Administered 2024-01-08: 650 mg via ORAL
  Filled 2024-01-08: qty 2

## 2024-01-08 NOTE — H&P (Signed)
 History and Physical   [name] FMW:[id number] DOB: May 05, 1986 DOA: 01/08/2024  PCP: Marvene Prentice SAUNDERS, FNP   Patient coming from: Home  Chief Complaint: Chest pain, shortness of breath  HPI: [name] is a 38 y.o. male with medical history significant of hypertension, diabetes, hyperlipidemia, CAD status post stent, CKD 5, chronic combined systolic and diastolic CHF, obesity presenting with chest pain and shortness of breath.  Patient reports 3 to 4 days of feeling generally unwell with nausea, malaise, dyspnea on exertion.  Today he had episode of chest tightness in addition to his other symptoms.  Episode lasted around 20 minutes and has resolved but he continues to have his shortness of breath and nausea.  Patient did miss his most recent injection for his hemoglobin when he was out of town, presumably EPO.  Last dose was in June.  Denies fevers, chills, abdominal pain, constipation, diarrhea.  ED Course: Vital signs in the ED notable for blood pressure in the 190s-220s systolic.  Lab workup included CMP with bicarb 24, BUN 68, creatinine stable at 6.61, glucose 134, T. bili 1.3.  CBC with hemoglobin 7.4 down from 8.2 for the last 2 months and 10 a year ago, platelets 144.  Troponin negative with repeat pending.  Respiratory panel for flu COVID and RSV normal.  Patient typed and screened in the ED.  Chest x-ray showed globular enlargement of the cardiac silhouette unable to exclude pericardial effusion.  This appeared to be new from October 2024.  Patient received dose of hydralazine  in the ED.  Review of Systems: As per HPI otherwise all other systems reviewed and are negative.  Past Medical History:  Diagnosis Date   Coronary artery disease    Diabetes mellitus without complication (HCC)    Hypercholesteremia    Hypertension    Myocardial infarction (HCC) 01/2020    Past Surgical History:  Procedure Laterality Date   CORONARY STENT INTERVENTION N/A  01/28/2020   Procedure: CORONARY STENT INTERVENTION;  Surgeon: Dann Candyce RAMAN, MD;  Location: [facility] INVASIVE CV LAB;  Service: Cardiovascular;  Laterality: N/A;   CORONARY/GRAFT ACUTE MI REVASCULARIZATION N/A 01/28/2020   Procedure: Coronary/Graft Acute MI Revascularization;  Surgeon: Dann Candyce RAMAN, MD;  Location: [facility] INVASIVE CV LAB;  Service: Cardiovascular;  Laterality: N/A;   LEFT HEART CATH AND CORONARY ANGIOGRAPHY N/A 01/28/2020   Procedure: LEFT HEART CATH AND CORONARY ANGIOGRAPHY;  Surgeon: Dann Candyce RAMAN, MD;  Location: [facility] INVASIVE CV LAB;  Service: Cardiovascular;  Laterality: N/A;    Social History  reports that he has never smoked. He has never used smokeless tobacco. He reports that he does not drink alcohol and does not use drugs.  No Known Allergies  Family History  Problem Relation Age of Onset   Diabetes Father    Heart disease Father    Stroke Maternal Grandmother 60  Reviewed on admission  Prior to Admission medications   Medication Sig Start Date End Date Taking? Authorizing Provider  aspirin  EC 81 MG tablet Take 81 mg by mouth daily. Swallow whole.    [provider]  atorvastatin  (LIPITOR ) 80 MG tablet TAKE 1 TABLET BY MOUTH EVERY DAY 11/14/23   Chandrasekhar, Mahesh A, MD  carvedilol  (COREG ) 25 MG tablet TAKE 1 TABLET BY MOUTH TWICE A DAY IN THE MORNING AND IN THE EVENING 04/28/23   Chandrasekhar, Mahesh A, MD  doxycycline  (VIBRA -TABS) 100 MG tablet Take 1 tablet (100 mg total) by mouth 2 (two) times daily. 09/15/23   Gershon,  Donnice SAUNDERS, DPM  ezetimibe  (ZETIA ) 10 MG tablet Take 1 tablet (10 mg total) by mouth daily. 04/22/23   Chandrasekhar, Stanly A, MD  hydrALAZINE  (APRESOLINE ) 50 MG tablet TAKE 1 & 1/2 TABLETS BY MOUTH 3 TIMES DAILY 12/13/23   Chandrasekhar, Mahesh A, MD  isosorbide  dinitrate (ISORDIL ) 20 MG tablet TAKE 1 TABLET BY MOUTH THREE TIMES A DAY 05/06/23   Chandrasekhar, Mahesh A, MD  nitroGLYCERIN  (NITROSTAT ) 0.4 MG SL tablet Place 1  tablet (0.4 mg total) under the tongue every 5 (five) minutes as needed for chest pain. 02/02/21 09/14/23  Dann Candyce RAMAN, MD  torsemide  (DEMADEX ) 20 MG tablet Take 20 mg by mouth 2 (two) times daily. 04/16/23   [provider]    Physical Exam: Vitals:   01/08/24 1400 01/08/24 1427 01/08/24 1500 01/08/24 1625  BP: (!) 220/113 (!) 220/109 (!) 226/108 (!) 206/110  Pulse: 78 79 81 86  Resp: 15 16 16 16   Temp:  98 F (36.7 C)    TempSrc:  Oral    SpO2: 99% 95% 94% 100%  Weight:      Height:        Physical Exam Constitutional:      General: He is not in acute distress.    Appearance: Normal appearance. He is obese.  HENT:     Head: Normocephalic and atraumatic.     Mouth/Throat:     Mouth: Mucous membranes are moist.     Pharynx: Oropharynx is clear.  Eyes:     Extraocular Movements: Extraocular movements intact.     Pupils: Pupils are equal, round, and reactive to light.  Cardiovascular:     Rate and Rhythm: Normal rate and regular rhythm.     Pulses: Normal pulses.     Heart sounds: Normal heart sounds.     Comments: Trace edema Pulmonary:     Effort: Pulmonary effort is normal. No respiratory distress.     Breath sounds: Normal breath sounds.  Abdominal:     General: Bowel sounds are normal. There is no distension.     Palpations: Abdomen is soft.     Tenderness: There is no abdominal tenderness.  Musculoskeletal:        General: No swelling or deformity.  Skin:    General: Skin is warm and dry.  Neurological:     General: No focal deficit present.     Mental Status: Mental status is at baseline.    Labs on Admission: I have personally reviewed following labs and imaging studies  CBC: Recent Labs  Lab 01/08/24 1208  WBC 6.7  NEUTROABS 5.0  HGB 7.4*  HCT 24.4*  MCV 94.2  PLT 144*    Basic Metabolic Panel: Recent Labs  Lab 01/08/24 1208  NA 143  K 4.1  CL 111  CO2 20*  GLUCOSE 134*  BUN 68*  CREATININE 6.61*  CALCIUM  9.0     GFR: Estimated Creatinine Clearance: 21.4 mL/min (A) (by C-G formula based on SCr of 6.61 mg/dL (H)).  Liver Function Tests: Recent Labs  Lab 01/08/24 1208  AST 17  ALT 28  ALKPHOS 86  BILITOT 1.3*  PROT 7.0  ALBUMIN 3.5    Urine analysis:    Component Value Date/Time   COLORURINE YELLOW 02/24/2022 0630   APPEARANCEUR CLEAR 02/24/2022 0630   LABSPEC 1.012 02/24/2022 0630   PHURINE 5.0 02/24/2022 0630   GLUCOSEU >=500 (A) 02/24/2022 0630   HGBUR NEGATIVE 02/24/2022 0630   BILIRUBINUR NEGATIVE 02/24/2022 0630  KETONESUR NEGATIVE 02/24/2022 0630   PROTEINUR 100 (A) 02/24/2022 0630   NITRITE NEGATIVE 02/24/2022 0630   LEUKOCYTESUR NEGATIVE 02/24/2022 0630    Radiological Exams on Admission: DG Chest Port 1 View Result Date: 01/08/2024 CLINICAL DATA:  sob EXAM: PORTABLE CHEST - 1 VIEW COMPARISON:  03/25/2023 FINDINGS: Lungs are clear. Interval globular enlargement of the cardiac silhouette, cannot exclude pericardial effusion. Pulmonary vascularity normal. Mediastinal contour normal. No effusion. Visualized bones unremarkable. IMPRESSION: Globular enlargement of the cardiac silhouette, cannot exclude pericardial effusion. Electronically Signed   By: JONETTA Faes M.D.   On: 01/08/2024 13:59   EKG: Independently reviewed.  Sinus rhythm at 75 bpm.  Low voltage in multiple leads.  Nonspecific T wave changes.  Assessment/Plan Active Problems:   Essential hypertension   Hyperlipidemia with target low density lipoprotein (LDL) cholesterol less than 55 mg/dL   Insulin -requiring or dependent type II diabetes mellitus (HCC)   CKD (chronic kidney disease) stage 5, GFR less than 15 ml/min (HCC)   HFrEF (heart failure with reduced ejection fraction) (HCC)   Symptomatic anemia > Hemoglobin 7.4 presenting with chest pain and shortness of breath. > This is in the setting of hemoglobin is typically greater than 8 receiving injections, presumably EPO, for CKD 5/anemia of chronic kidney  disease. > Also concern for developing pericardial effusion as below that may be contributing. > Ordered for transfusion in the ED. - Will monitor on telemetry overnight - Will send message [facility] in case there is any worsening in his renal function or other indication for dialysis as well as depending on the results of his echocardiogram he may need more advanced cardiology evaluation there - Monitor on telemetry - Continue with transfusion - Trend CBC  Pericardial effusion Chronic combined systolic and diastolic CHF Rule out CHF exacerbation component > Presenting with chest pain shortness of breath as above.  Likely degree of symptomatic anemia. > X-ray showed possible pericardial effusion to be evaluated with echo.  Initial troponin flat with repeat pending.  No BNP.  Does not appear significantly volume overloaded. > Moderate Pericardial effusion on brief POCUS exam. EF Likely 30-40% range (will need formal echo) > History of CHF with last echo in the 21 showing EF 35-40%, G1 DD, normal RV function. - Monitoring on telemetry, plan for [facility] admission - Cardiology consult - BNP - Echocardiogram to evaluate CHF and suspected pericardial effusion - Continue home torsemide , Coreg , Isordil   Hypertension ?Hypertensive urgency/emergency > Known history of hypertension.  Presenting with elevated blood pressure in the 190s-220s systolic.  Unclear if some of his symptoms of shortness of breath and chest pain could be related to hypertensive urgency/emergency. However, creatinine is stable, troponin normal on first check.  Checking BNP.   - Resume home antihypertensives - Monitoring on telemetry overnight - Supportive care, can add in as needed labetalol   CAD > History of stent placement in 2021. > Presenting with chest pain shortness of breath and nausea.  Initial troponin normal with repeat pending. - Continue home ASA, atorvastatin , Coreg , Isordil   Diabetes - SSI  Obesity -  Noted   DVT prophylaxis: Heparin  Code Status:   Full Family Communication:  Updated at bedside  Disposition Plan:   Patient is from:  Home  Anticipated DC to:  Home  Anticipated DC date:  1 to 3 days  Anticipated DC barriers: None  Consults called:  None Admission status:  Observation, telemetry  Severity of Illness: The appropriate patient status for this patient is OBSERVATION. Observation status  is judged to be reasonable and necessary in order to provide the required intensity of service to ensure the patient's safety. The patient's presenting symptoms, physical exam findings, and initial radiographic and laboratory data in the context of their medical condition is felt to place them at decreased risk for further clinical deterioration. Furthermore, it is anticipated that the patient will be medically stable for discharge from the hospital within 2 midnights of admission.    Marsa KATHEE Scurry MD [facility]  How to contact the [facility] Attending or Consulting provider 7A - 7P or covering provider during after hours 7P -7A, for this patient?   Check the care team in [facility] and look for a) attending/consulting [facility] provider listed and b) the [facility] team listed Log into [email or web address] and use [place]'s universal password to access. If you do not have the password, please contact the hospital operator. Locate the [facility] provider you are looking for under [facility] and page to a number that you can be directly reached. If you still have difficulty reaching the provider, please page the [facility] (Director on Call) for the Hospitalists listed on amion for assistance.  01/08/2024, 4:45 PM

## 2024-01-08 NOTE — ED Triage Notes (Signed)
 Pt reports chest pain that started about 45 minutes. Pt is a cardiac pt and had a stent placed in 2021. Pt recently diagnosed with stage 5 kidney disease. Pt reports the pain as pressure and radiates to the left shoulder.

## 2024-01-08 NOTE — ED Provider Notes (Signed)
 [place] EMERGENCY DEPARTMENT AT [facility] Provider Note   CSN: [id number] Arrival date & time: 01/08/24  1131     Patient presents with: Chest Pain   [name] is a 38 y.o. male.   Patient is a 38 year old male with a history of hypertension, diabetes, CAD status post stent in 2021, stage V kidney disease who is compliant with all of his medications who is presenting today with a 3 to 4-day history of feeling generally unwell with some intermittent nausea, malaise, shortness of breath with exertion and cough.  Today while he was at church he had a 20-minute episode of chest tightness with the ongoing symptoms of shortness of breath and nausea he has been dealing with over the last few days.  The chest tightness has resolved but he still has the other symptoms.  He has felt feverish but wife states the temperature at home was normal but they are not sure their thermometer is accurate.  Nobody else at home has been ill.  He also reports that he did not get his shot that he has been getting to boost his hemoglobin because he was out of town and the last one he got was in June.  He does not feel that the swelling in his legs is any worse than it has been and might actually be a little bit better.  He has not had any diarrhea or urinary complaints.  The history is provided by the patient, the spouse and medical records.  Chest Pain      Prior to Admission medications   Medication Sig Start Date End Date Taking? Authorizing Provider  aspirin  EC 81 MG tablet Take 81 mg by mouth daily. Swallow whole.    [provider]  atorvastatin  (LIPITOR ) 80 MG tablet TAKE 1 TABLET BY MOUTH EVERY DAY 11/14/23   Chandrasekhar, Mahesh A, MD  carvedilol  (COREG ) 25 MG tablet TAKE 1 TABLET BY MOUTH TWICE A DAY IN THE MORNING AND IN THE EVENING 04/28/23   Chandrasekhar, Mahesh A, MD  doxycycline  (VIBRA -TABS) 100 MG tablet Take 1 tablet (100 mg total) by mouth 2 (two) times daily.  09/15/23   Gershon Donnice SAUNDERS, DPM  ezetimibe  (ZETIA ) 10 MG tablet Take 1 tablet (10 mg total) by mouth daily. 04/22/23   Chandrasekhar, Mahesh A, MD  hydrALAZINE  (APRESOLINE ) 50 MG tablet TAKE 1 & 1/2 TABLETS BY MOUTH 3 TIMES DAILY 12/13/23   Chandrasekhar, Mahesh A, MD  isosorbide  dinitrate (ISORDIL ) 20 MG tablet TAKE 1 TABLET BY MOUTH THREE TIMES A DAY 05/06/23   Chandrasekhar, Mahesh A, MD  nitroGLYCERIN  (NITROSTAT ) 0.4 MG SL tablet Place 1 tablet (0.4 mg total) under the tongue every 5 (five) minutes as needed for chest pain. 02/02/21 09/14/23  Dann Candyce RAMAN, MD  torsemide  (DEMADEX ) 20 MG tablet Take 20 mg by mouth 2 (two) times daily. 04/16/23   [provider]    Allergies: Patient has no known allergies.    Review of Systems  Cardiovascular:  Positive for chest pain.    Updated Vital Signs BP (!) 206/110   Pulse 86   Temp 98 F (36.7 C) (Oral)   Resp 16   Ht 6' 4 (1.93 m)   Wt 124.7 kg   SpO2 100%   BMI 33.47 kg/m   Physical Exam Vitals and nursing note reviewed.  Constitutional:      General: He is not in acute distress.    Appearance: He is well-developed.  HENT:  Head: Normocephalic and atraumatic.  Eyes:     Conjunctiva/sclera: Conjunctivae normal.     Pupils: Pupils are equal, round, and reactive to light.  Cardiovascular:     Rate and Rhythm: Normal rate and regular rhythm.     Pulses: Normal pulses.     Heart sounds: No murmur heard. Pulmonary:     Effort: Pulmonary effort is normal. No respiratory distress.     Breath sounds: Normal breath sounds. No wheezing or rales.  Abdominal:     General: There is no distension.     Palpations: Abdomen is soft.     Tenderness: There is no abdominal tenderness. There is no guarding or rebound.  Musculoskeletal:        General: No tenderness. Normal range of motion.     Cervical back: Normal range of motion and neck supple.     Right lower leg: Edema present.     Left lower leg: Edema present.      Comments: Trace to 1+ edema in the ankles  Skin:    General: Skin is warm and dry.     Findings: No erythema or rash.  Neurological:     Mental Status: He is alert and oriented to person, place, and time. Mental status is at baseline.  Psychiatric:        Mood and Affect: Mood normal.        Behavior: Behavior normal.     (all labs ordered are listed, but only abnormal results are displayed) Labs Reviewed  CBC WITH DIFFERENTIAL/PLATELET - Abnormal; Notable for the following components:      Result Value   RBC 2.59 (*)    Hemoglobin 7.4 (*)    HCT 24.4 (*)    Platelets 144 (*)    All other components within normal limits  COMPREHENSIVE METABOLIC PANEL WITH GFR - Abnormal; Notable for the following components:   CO2 20 (*)    Glucose, Bld 134 (*)    BUN 68 (*)    Creatinine, Ser 6.61 (*)    Total Bilirubin 1.3 (*)    GFR, Estimated 10 (*)    All other components within normal limits  RESP PANEL BY RT-PCR (RSV, FLU A&B, COVID)  [id number]  TYPE AND SCREEN  TROPONIN I (HIGH SENSITIVITY)  TROPONIN I (HIGH SENSITIVITY)    EKG: EKG Interpretation Date/Time:  Sunday January 08 2024 12:16:47 EDT Ventricular Rate:  75 PR Interval:  165 QRS Duration:  93 QT Interval:  443 QTC Calculation: 495 R Axis:   -74  Text Interpretation: Sinus rhythm Left anterior fascicular block Anterior infarct, old No significant change since last tracing Confirmed by Doretha Folks ([id number]) on 01/08/2024 1:02:39 PM  Radiology: [facility] Chest Port 1 View Result Date: 01/08/2024 CLINICAL DATA:  sob EXAM: PORTABLE CHEST - 1 VIEW COMPARISON:  03/25/2023 FINDINGS: Lungs are clear. Interval globular enlargement of the cardiac silhouette, cannot exclude pericardial effusion. Pulmonary vascularity normal. Mediastinal contour normal. No effusion. Visualized bones unremarkable. IMPRESSION: Globular enlargement of the cardiac silhouette, cannot exclude pericardial effusion. Electronically Signed   By: JONETTA Faes M.D.   On:  01/08/2024 13:59     Procedures    EMERGENCY DEPARTMENT US  CARDIAC EXAM Study: Limited Ultrasound of the Heart and Pericardium  INDICATIONS:Chest pain Multiple views of the heart and pericardium were obtained in real-time with a multi-frequency probe.  PERFORMED AB:Fbdzoq IMAGES ARCHIVED?: Yes LIMITATIONS:  None VIEWS USED: Parasternal long axis and Apical 4 chamber  INTERPRETATION: Cardiac activity present, Pericardial  effusion present, and Normal contractility  Medications Ordered in the ED  hydrALAZINE  (APRESOLINE ) tablet 75 mg (75 mg Oral Given 01/08/24 1626)                                    Medical Decision Making Amount and/or Complexity of Data Reviewed External Data Reviewed: notes. Labs: ordered. Decision-making details documented in ED Course. Radiology: ordered and independent interpretation performed. Decision-making details documented in ED Course. ECG/medicine tests: ordered and independent interpretation performed. Decision-making details documented in ED Course.  Risk Prescription drug management. Decision regarding hospitalization.   Pt with multiple medical problems and comorbidities and presenting today with a complaint that caries a high risk for morbidity and mortality.  Here today with the above complaints.  Concern for worsening renal disease versus ACS versus CHF versus anemia versus infectious etiology such as pneumonia, COVID or other viral etiologies. I independently interpreted patient's EKG and labs.  EKG shows no significant findings, initial troponin is baseline at 14 and normal, CBC with a hemoglobin of 7.4 today which is a 1 g drop from last month most likely related to his chronic renal disease and not getting his hemoglobin shots this month.  White count is within normal limits, platelets are normal, CMP with persistent stage V kidney disease with a GFR of 10, BUN of 68 and creatinine of 6.61 which has not significantly changed since last  month.  LFTs are normal.  COVID is pending.  Chest x-ray is pending.  4:37 PM COVID is neg.  I have independently visualized and interpreted pt's images today. Chest x-ray with globular looking heart but lungs are clear.   Bedside u/s with small pericardial effusion but no tamponade pathology Patient has been persistently hypertensive here he has not had his noontime dose of hydralazine  which was given however hypertensive urgency may also be causing some of his symptoms.  Also feel that anemia is adding into why he is feeling unwell also.  Second troponin is pending however feel that patient would benefit from admission, blood transfusion, blood pressure control.  Discussed this with the patient he and his wife are comfortable with this plan.  Will consult hospitalist for admission.  CRITICAL CARE Performed by: Sharetta Ricchio Total critical care time: 30 minutes Critical care time was exclusive of separately billable procedures and treating other patients. Critical care was necessary to treat or prevent imminent or life-threatening deterioration. Critical care was time spent personally by me on the following activities: development of treatment plan with patient and/or surrogate as well as nursing, discussions with consultants, evaluation of patient's response to treatment, examination of patient, obtaining history from patient or surrogate, ordering and performing treatments and interventions, ordering and review of laboratory studies, ordering and review of radiographic studies, pulse oximetry and re-evaluation of patient's condition.     Final diagnoses:  Nonspecific chest pain  Symptomatic anemia  Hypertensive urgency    ED Discharge Orders     None          Doretha Folks, MD 01/08/24 [phone]

## 2024-01-09 ENCOUNTER — Observation Stay (HOSPITAL_COMMUNITY)

## 2024-01-09 ENCOUNTER — Encounter (HOSPITAL_COMMUNITY): Payer: Self-pay | Admitting: Internal Medicine

## 2024-01-09 DIAGNOSIS — Z7401 Bed confinement status: Secondary | ICD-10-CM | POA: Diagnosis not present

## 2024-01-09 DIAGNOSIS — Z7982 Long term (current) use of aspirin: Secondary | ICD-10-CM | POA: Diagnosis not present

## 2024-01-09 DIAGNOSIS — I252 Old myocardial infarction: Secondary | ICD-10-CM | POA: Diagnosis not present

## 2024-01-09 DIAGNOSIS — I255 Ischemic cardiomyopathy: Secondary | ICD-10-CM | POA: Diagnosis not present

## 2024-01-09 DIAGNOSIS — D649 Anemia, unspecified: Secondary | ICD-10-CM | POA: Diagnosis not present

## 2024-01-09 DIAGNOSIS — E119 Type 2 diabetes mellitus without complications: Secondary | ICD-10-CM | POA: Diagnosis not present

## 2024-01-09 DIAGNOSIS — I130 Hypertensive heart and chronic kidney disease with heart failure and stage 1 through stage 4 chronic kidney disease, or unspecified chronic kidney disease: Secondary | ICD-10-CM | POA: Diagnosis not present

## 2024-01-09 DIAGNOSIS — Z6834 Body mass index (BMI) 34.0-34.9, adult: Secondary | ICD-10-CM | POA: Diagnosis not present

## 2024-01-09 DIAGNOSIS — Z79899 Other long term (current) drug therapy: Secondary | ICD-10-CM | POA: Diagnosis not present

## 2024-01-09 DIAGNOSIS — I5021 Acute systolic (congestive) heart failure: Secondary | ICD-10-CM | POA: Diagnosis not present

## 2024-01-09 DIAGNOSIS — I132 Hypertensive heart and chronic kidney disease with heart failure and with stage 5 chronic kidney disease, or end stage renal disease: Secondary | ICD-10-CM | POA: Diagnosis not present

## 2024-01-09 DIAGNOSIS — R0602 Shortness of breath: Secondary | ICD-10-CM | POA: Diagnosis present

## 2024-01-09 DIAGNOSIS — R0789 Other chest pain: Secondary | ICD-10-CM | POA: Diagnosis not present

## 2024-01-09 DIAGNOSIS — Z955 Presence of coronary angioplasty implant and graft: Secondary | ICD-10-CM | POA: Diagnosis not present

## 2024-01-09 DIAGNOSIS — I10 Essential (primary) hypertension: Secondary | ICD-10-CM | POA: Diagnosis not present

## 2024-01-09 DIAGNOSIS — D631 Anemia in chronic kidney disease: Secondary | ICD-10-CM | POA: Diagnosis not present

## 2024-01-09 DIAGNOSIS — R079 Chest pain, unspecified: Secondary | ICD-10-CM | POA: Diagnosis not present

## 2024-01-09 DIAGNOSIS — E7800 Pure hypercholesterolemia, unspecified: Secondary | ICD-10-CM | POA: Diagnosis not present

## 2024-01-09 DIAGNOSIS — I517 Cardiomegaly: Secondary | ICD-10-CM | POA: Diagnosis not present

## 2024-01-09 DIAGNOSIS — Z8249 Family history of ischemic heart disease and other diseases of the circulatory system: Secondary | ICD-10-CM | POA: Diagnosis not present

## 2024-01-09 DIAGNOSIS — I160 Hypertensive urgency: Secondary | ICD-10-CM | POA: Diagnosis not present

## 2024-01-09 DIAGNOSIS — N179 Acute kidney failure, unspecified: Secondary | ICD-10-CM | POA: Diagnosis not present

## 2024-01-09 DIAGNOSIS — E1122 Type 2 diabetes mellitus with diabetic chronic kidney disease: Secondary | ICD-10-CM | POA: Diagnosis not present

## 2024-01-09 DIAGNOSIS — R5381 Other malaise: Secondary | ICD-10-CM | POA: Diagnosis not present

## 2024-01-09 DIAGNOSIS — E66811 Obesity, class 1: Secondary | ICD-10-CM | POA: Diagnosis not present

## 2024-01-09 DIAGNOSIS — I5043 Acute on chronic combined systolic (congestive) and diastolic (congestive) heart failure: Secondary | ICD-10-CM | POA: Diagnosis not present

## 2024-01-09 DIAGNOSIS — E785 Hyperlipidemia, unspecified: Secondary | ICD-10-CM | POA: Diagnosis not present

## 2024-01-09 DIAGNOSIS — N185 Chronic kidney disease, stage 5: Secondary | ICD-10-CM | POA: Diagnosis not present

## 2024-01-09 DIAGNOSIS — I2510 Atherosclerotic heart disease of native coronary artery without angina pectoris: Secondary | ICD-10-CM

## 2024-01-09 DIAGNOSIS — Z833 Family history of diabetes mellitus: Secondary | ICD-10-CM | POA: Diagnosis not present

## 2024-01-09 DIAGNOSIS — Z794 Long term (current) use of insulin: Secondary | ICD-10-CM | POA: Diagnosis not present

## 2024-01-09 DIAGNOSIS — Z1152 Encounter for screening for COVID-19: Secondary | ICD-10-CM | POA: Diagnosis not present

## 2024-01-09 DIAGNOSIS — I5020 Unspecified systolic (congestive) heart failure: Secondary | ICD-10-CM | POA: Diagnosis not present

## 2024-01-09 DIAGNOSIS — I3139 Other pericardial effusion (noninflammatory): Secondary | ICD-10-CM | POA: Diagnosis not present

## 2024-01-09 LAB — CBC
HCT: 29 % — ABNORMAL LOW (ref 39.0–52.0)
Hemoglobin: 9.2 g/dL — ABNORMAL LOW (ref 13.0–17.0)
MCH: 29 pg (ref 26.0–34.0)
MCHC: 31.7 g/dL (ref 30.0–36.0)
MCV: 91.5 fL (ref 80.0–100.0)
Platelets: 155 K/uL (ref 150–400)
RBC: 3.17 MIL/uL — ABNORMAL LOW (ref 4.22–5.81)
RDW: 14.1 % (ref 11.5–15.5)
WBC: 7.4 K/uL (ref 4.0–10.5)
nRBC: 0 % (ref 0.0–0.2)

## 2024-01-09 LAB — COMPREHENSIVE METABOLIC PANEL WITH GFR
ALT: 23 U/L (ref 0–44)
AST: 15 U/L (ref 15–41)
Albumin: 3.3 g/dL — ABNORMAL LOW (ref 3.5–5.0)
Alkaline Phosphatase: 81 U/L (ref 38–126)
Anion gap: 12 (ref 5–15)
BUN: 63 mg/dL — ABNORMAL HIGH (ref 6–20)
CO2: 20 mmol/L — ABNORMAL LOW (ref 22–32)
Calcium: 9.2 mg/dL (ref 8.9–10.3)
Chloride: 110 mmol/L (ref 98–111)
Creatinine, Ser: 6.5 mg/dL — ABNORMAL HIGH (ref 0.61–1.24)
GFR, Estimated: 10 mL/min — ABNORMAL LOW (ref >60)
Glucose, Bld: 121 mg/dL — ABNORMAL HIGH (ref 70–99)
Potassium: 3.9 mmol/L (ref 3.5–5.1)
Sodium: 142 mmol/L (ref 135–145)
Total Bilirubin: 1.4 mg/dL — ABNORMAL HIGH (ref 0.0–1.2)
Total Protein: 6.6 g/dL (ref 6.5–8.1)

## 2024-01-09 LAB — BPAM RBC
Blood Product Expiration Date: 202508212359
ISSUE DATE / TIME: 202507272206
Unit Type and Rh: 9500

## 2024-01-09 LAB — GLUCOSE, CAPILLARY
Glucose-Capillary: 108 mg/dL — ABNORMAL HIGH (ref 70–99)
Glucose-Capillary: 129 mg/dL — ABNORMAL HIGH (ref 70–99)
Glucose-Capillary: 117 mg/dL — ABNORMAL HIGH (ref 70–99)
Glucose-Capillary: 143 mg/dL — ABNORMAL HIGH (ref 70–99)

## 2024-01-09 LAB — HIV ANTIBODY (ROUTINE TESTING W REFLEX): HIV Screen 4th Generation wRfx: NONREACTIVE

## 2024-01-09 LAB — ECHOCARDIOGRAM COMPLETE
AR max vel: 3.03 cm2
AV Peak grad: 7.8 mmHg
Ao pk vel: 1.4 m/s
Area-P 1/2: 5.84 cm2
Height: 76 in
S' Lateral: 4.8 cm
Weight: 4400 [oz_av]

## 2024-01-09 LAB — TYPE AND SCREEN
ABO/RH(D): O NEG
Antibody Screen: NEGATIVE
Unit division: 0

## 2024-01-09 LAB — MRSA NEXT GEN BY PCR, NASAL: MRSA by PCR Next Gen: NOT DETECTED

## 2024-01-09 MED ORDER — ASPIRIN 81 MG PO TBEC
81.0000 mg | DELAYED_RELEASE_TABLET | Freq: Every day | ORAL | Status: DC
  Administered 2024-01-09 – 2024-01-10 (×2): 81 mg via ORAL
  Filled 2024-01-09 (×3): qty 1

## 2024-01-09 MED ORDER — CARVEDILOL 25 MG PO TABS
25.0000 mg | ORAL_TABLET | Freq: Two times a day (BID) | ORAL | Status: DC
Start: 2024-01-09 — End: 2024-01-11
  Administered 2024-01-09 – 2024-01-10 (×4): 25 mg via ORAL
  Filled 2024-01-09 (×5): qty 1

## 2024-01-09 MED ORDER — ISOSORBIDE DINITRATE 10 MG PO TABS
20.0000 mg | ORAL_TABLET | Freq: Three times a day (TID) | ORAL | Status: DC
  Administered 2024-01-09 – 2024-01-10 (×6): 20 mg via ORAL
  Filled 2024-01-09 (×7): qty 2

## 2024-01-09 MED ORDER — TORSEMIDE 20 MG PO TABS: 20.0000 mg | ORAL_TABLET | Freq: Two times a day (BID) | ORAL | Status: DC

## 2024-01-09 MED ORDER — PROCHLORPERAZINE EDISYLATE 10 MG/2ML IJ SOLN
5.0000 mg | Freq: Four times a day (QID) | INTRAMUSCULAR | Status: DC | PRN
  Administered 2024-01-09: 5 mg via INTRAVENOUS
  Filled 2024-01-09: qty 2

## 2024-01-09 MED ORDER — PERFLUTREN LIPID MICROSPHERE
1.0000 mL | INTRAVENOUS | Status: AC | PRN
  Administered 2024-01-09: 4 mL via INTRAVENOUS

## 2024-01-09 MED ORDER — FUROSEMIDE 10 MG/ML IJ SOLN: 80.0000 mg | Freq: Two times a day (BID) | INTRAMUSCULAR | Status: DC

## 2024-01-09 MED ORDER — FUROSEMIDE 10 MG/ML IJ SOLN
80.0000 mg | Freq: Once | INTRAMUSCULAR | Status: AC
  Administered 2024-01-09: 80 mg via INTRAVENOUS
  Filled 2024-01-09: qty 8

## 2024-01-09 MED ORDER — EZETIMIBE 10 MG PO TABS
10.0000 mg | ORAL_TABLET | Freq: Every day | ORAL | Status: DC
  Administered 2024-01-09 – 2024-01-10 (×2): 10 mg via ORAL
  Filled 2024-01-09 (×3): qty 1

## 2024-01-09 MED ORDER — HYDRALAZINE HCL 50 MG PO TABS
75.0000 mg | ORAL_TABLET | Freq: Three times a day (TID) | ORAL | Status: DC
  Administered 2024-01-09 – 2024-01-11 (×7): 75 mg via ORAL
  Filled 2024-01-09 (×7): qty 1

## 2024-01-09 MED ORDER — ATORVASTATIN CALCIUM 80 MG PO TABS
80.0000 mg | ORAL_TABLET | Freq: Every day | ORAL | Status: DC
  Administered 2024-01-09 – 2024-01-10 (×2): 80 mg via ORAL
  Filled 2024-01-09 (×3): qty 1

## 2024-01-09 MED ORDER — FUROSEMIDE 10 MG/ML IJ SOLN
80.0000 mg | Freq: Two times a day (BID) | INTRAMUSCULAR | Status: DC
  Administered 2024-01-09 – 2024-01-10 (×3): 80 mg via INTRAVENOUS
  Filled 2024-01-09 (×4): qty 8

## 2024-01-09 NOTE — Care Management Obs Status (Signed)
 MEDICARE OBSERVATION STATUS NOTIFICATION   Patient Details  Name: [name] MRN: [id number] Date of Birth: [birth year]   Medicare Observation Status Notification Given:  Yes  Obs letter signed and copy goven  Allyssia Skluzacek Crawford 01/09/2024, 1:19 PM

## 2024-01-09 NOTE — Consult Note (Signed)
 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  Reason for Consult: L great toe and 3rd digit  Wound type: full thickness likely r/t diabetes  Pressure Injury POA: NA  Measurement: see nursing flowsheet  Wound bed: distal tip L great toe red dry with some dark tissue noted medial aspect, dark dry tissue 3rd digit  Drainage (amount, consistency, odor) per nursing flowsheet  Periwound: heavy callusing to L great toe, peeling dry skin noted with some dark discoloration nail bed  Dressing procedure/placement/frequency:  Cleanse L  3rd digit and L great toe wounds with Betadine, apply Betadine moistened gauze to wound beds daily, cover with dry gauze and secure with tape.     Patient should continue follow-up with podiatry as an outpatient for ongoing management of these wounds.    POC discussed with bedside nurse.  WOC team will not follow. Reconsult if further needs arise.   Thank you,    Powell Bar MSN, RN-BC, [employer]

## 2024-01-09 NOTE — Progress Notes (Addendum)
   01/09/24 0954  Mobility  Activity Ambulated independently in hallway  Level of Assistance Standby assist, set-up cues, supervision of patient - no hands on  Assistive Device None  Activity Response Tolerated well  Mobility Referral Yes  Mobility visit 1 Mobility  Mobility Specialist Start Time (ACUTE ONLY) 0954  Mobility Specialist Stop Time (ACUTE ONLY) 1004  Mobility Specialist Time Calculation (min) (ACUTE ONLY) 10 min   Mobility Specialist: Progress Note  Pre-Mobility:      HR 80, SpO2 100% 2L Post-Mobility:    HR  87, SpO2 97% RA  Pt agreeable to mobility session - received in bed. Pt was asymptomatic throughout session with no complaints, no SOB noted. Returned to bed with all needs met - call bell within reach. Wife Present.   Virgle Boards, BS Mobility Specialist Please contact via SecureChat or  Rehab office at [phone].

## 2024-01-09 NOTE — Progress Notes (Signed)
 Patient from home with c/o chest pain and shortness of breath. Patient seen by Cardiology and Nephrology. Echo done today.

## 2024-01-09 NOTE — Progress Notes (Signed)
 PROGRESS NOTE    [name]  FMW:[id number] DOB: September 24, 1985 DOA: 01/08/2024 PCP: Marvene Prentice SAUNDERS, FNP  Chief Complaint  Patient presents with   Chest Pain    Brief Narrative:    [name] is [name] 38 y.o. male with medical history significant of hypertension, diabetes, hyperlipidemia, CAD status post stent, CKD 5, chronic combined systolic and diastolic CHF, obesity presenting with chest pain and shortness of breath.   Patient reports 3 to 4 days of feeling generally unwell with nausea, malaise, dyspnea on exertion.  Today he had episode of chest tightness in addition to his other symptoms.  Episode lasted around 20 minutes and has resolved but he continues to have his shortness of breath and nausea.   Patient did miss his most recent injection for his hemoglobin when he was out of town, presumably EPO.  Last dose was in June.  Assessment & Plan:   Principal Problem:   Symptomatic anemia Active Problems:   Essential hypertension   Hyperlipidemia with target low density lipoprotein (LDL) cholesterol less than 55 mg/dL   Insulin -requiring or dependent type II diabetes mellitus (HCC)   CKD (chronic kidney disease) stage 5, GFR less than 15 ml/min (HCC)   HFrEF (heart failure with reduced ejection fraction) (HCC)  Symptomatic Anemia Baseline hb appears to be around 8 7.4 on presentation, likely due to CKD  S/p 1 unit pRBC, hb 9.2 today Symptoms seem to have improved  Shortness of Breath  Orthopnea  Volume Overload  Has mild pitting edema, also reports orthopnea for the past week Pending IV lasix  this morning - will defer additional diuretic decisions to renal/cards Echo pending to follow up his pericardial effusion   Pericardial Effusion HFrEF Has some pitting edema on exam, describes orthopnea as well Lasix  as above Awaiting echo Appreciate cardiology assistance  CKD V Sees nephrology outpatient - creatinine appears to be at baseline, but reports orthopnea,  also now with pericardial effusion Appreciate renal assistance  HTN Coreg , isordil , torsemide , hydralazine   CAD S/p stent 2021 Aspirin , statin   T2DM SSI   Obesity Body mass index is 33.47 kg/m.    DVT prophylaxis: heparin  Code Status: full Family Communication: wife at bedside Disposition:   Status is: Observation The patient remains OBS appropriate and will d/c before 2 midnights.   Consultants:  Cardiology Rena   Procedures:  none  Antimicrobials:  Anti-infectives (From admission, onward)    None       Subjective: Feeling better since being here, presented with SOB over past week or so - orthopnea HA, nausea, CP as well - chest pain lasted about 30 min, went away on its own   Objective: Vitals:   01/09/24 0354 01/09/24 0400 01/09/24 0407 01/09/24 0500  BP: (!) 192/112 (!) 194/99 (!) 194/99 (!) 168/99  Pulse: 89 88  86  Resp: 20 17  18   Temp: 98.9 F (37.2 C) 98.9 F (37.2 C)    TempSrc: Oral Oral    SpO2: 97% 98%  92%  Weight:      Height:        Intake/Output Summary (Last 24 hours) at 01/09/2024 0817 Last data filed at 01/09/2024 0400 Gross per 24 hour  Intake 655 ml  Output 1250 ml  Net -595 ml   Filed Weights   01/08/24 1156  Weight: 124.7 kg    Examination:  General exam: Appears calm and comfortable - sitting up on edge of bed Respiratory system: diminished, unlabored on 2 L Cardiovascular  system: RRR Gastrointestinal system: Abdomen is nondistended, soft and nontender.  Central nervous system: Alert and oriented. No focal neurological deficits. Extremities: bilateral 1+ LE edema    Data Reviewed: I have personally reviewed following labs and imaging studies  CBC: Recent Labs  Lab 01/08/24 1208 01/09/24 0633  WBC 6.7 7.4  NEUTROABS 5.0  --   HGB 7.4* 9.2*  HCT 24.4* 29.0*  MCV 94.2 91.5  PLT 144* 155    Basic Metabolic Panel: Recent Labs  Lab 01/08/24 1208 01/08/24 1645 01/09/24 0633  NA 143  --  142  K  4.1  --  3.9  CL 111  --  110  CO2 20*  --  20*  GLUCOSE 134*  --  121*  BUN 68*  --  63*  CREATININE 6.61*  --  6.50*  CALCIUM  9.0  --  9.2  MG  --  2.7*  --     GFR: Estimated Creatinine Clearance: 21.8 mL/min ([name]) (by C-G formula based on SCr of 6.5 mg/dL (H)).  Liver Function Tests: Recent Labs  Lab 01/08/24 1208 01/09/24 0633  AST 17 15  ALT 28 23  ALKPHOS 86 81  BILITOT 1.3* 1.4*  PROT 7.0 6.6  ALBUMIN 3.5 3.3*    CBG: Recent Labs  Lab 01/08/24 1954  GLUCAP 120*     Recent Results (from the past 240 hours)  Resp panel by RT-PCR (RSV, Flu [name]&B, Covid) Anterior Nasal Swab     Status: None   Collection Time: 01/08/24  1:34 PM   Specimen: Anterior Nasal Swab  Result Value Ref Range Status   SARS Coronavirus 2 by RT PCR NEGATIVE NEGATIVE Final    Comment: (NOTE) SARS-CoV-2 target nucleic acids are NOT DETECTED.  The SARS-CoV-2 RNA is generally detectable in upper respiratory specimens during the acute phase of infection. The lowest concentration of SARS-CoV-2 viral copies this assay can detect is 138 copies/mL. [name] negative result does not preclude SARS-Cov-2 infection and should not be used as the sole basis for treatment or other patient management decisions. [name] negative result may occur with  improper specimen collection/handling, submission of specimen other than nasopharyngeal swab, presence of viral mutation(s) within the areas targeted by this assay, and inadequate number of viral copies(<138 copies/mL). [name] negative result must be combined with clinical observations, patient history, and epidemiological information. The expected result is Negative.  Fact Sheet for Patients:  BloggerCourse.com  Fact Sheet for Healthcare Providers:  SeriousBroker.it  This test is no t yet approved or cleared by the [place]  FDA and  has been authorized for detection and/or diagnosis of SARS-CoV-2 by FDA under an  Emergency Use Authorization (EUA). This EUA will remain  in effect (meaning this test can be used) for the duration of the COVID-19 declaration under Section 564(b)(1) of the Act, 21 U.S.C.section 360bbb-3(b)(1), unless the authorization is terminated  or revoked sooner.       Influenza [name] by PCR NEGATIVE NEGATIVE Final   Influenza B by PCR NEGATIVE NEGATIVE Final    Comment: (NOTE) The Xpert Xpress SARS-CoV-2/FLU/RSV plus assay is intended as an aid in the diagnosis of influenza from Nasopharyngeal swab specimens and should not be used as [name] sole basis for treatment. Nasal washings and aspirates are unacceptable for Xpert Xpress SARS-CoV-2/FLU/RSV testing.  Fact Sheet for Patients: BloggerCourse.com  Fact Sheet for Healthcare Providers: SeriousBroker.it  This test is not yet approved or cleared by the [place]  FDA and has been authorized for detection and/or diagnosis  of SARS-CoV-2 by FDA under an Emergency Use Authorization (EUA). This EUA will remain in effect (meaning this test can be used) for the duration of the COVID-19 declaration under Section 564(b)(1) of the Act, 21 U.S.C. section 360bbb-3(b)(1), unless the authorization is terminated or revoked.     Resp Syncytial Virus by PCR NEGATIVE NEGATIVE Final    Comment: (NOTE) Fact Sheet for Patients: BloggerCourse.com  Fact Sheet for Healthcare Providers: SeriousBroker.it  This test is not yet approved or cleared by the [place]  FDA and has been authorized for detection and/or diagnosis of SARS-CoV-2 by FDA under an Emergency Use Authorization (EUA). This EUA will remain in effect (meaning this test can be used) for the duration of the COVID-19 declaration under Section 564(b)(1) of the Act, 21 U.S.C. section 360bbb-3(b)(1), unless the authorization is terminated or revoked.  Performed at [facility], [address], [place], [place] [address]   MRSA Next Gen by PCR, Nasal     Status: None   Collection Time: 01/09/24  3:59 AM   Specimen: Nasal Mucosa; Nasal Swab  Result Value Ref Range Status   MRSA by PCR Next Gen NOT DETECTED NOT DETECTED Final    Comment: (NOTE) The GeneXpert MRSA Assay (FDA approved for NASAL specimens only), is one component of [name] comprehensive MRSA colonization surveillance program. It is not intended to diagnose MRSA infection nor to guide or monitor treatment for MRSA infections. Test performance is not FDA approved in patients less than 64 years old. Performed at [facility], [address] [place], [place] [address]          Radiology Studies: DG Chest Port 1 View Result Date: 01/08/2024 CLINICAL DATA:  sob EXAM: PORTABLE CHEST - 1 VIEW COMPARISON:  03/25/2023 FINDINGS: Lungs are clear. Interval globular enlargement of the cardiac silhouette, cannot exclude pericardial effusion. Pulmonary vascularity normal. Mediastinal contour normal. No effusion. Visualized bones unremarkable. IMPRESSION: Globular enlargement of the cardiac silhouette, cannot exclude pericardial effusion. Electronically Signed   By: [name] M.D.   On: 01/08/2024 13:59        Scheduled Meds:  sodium chloride    Intravenous Once   aspirin  EC  81 mg Oral Daily   atorvastatin   80 mg Oral Daily   carvedilol   25 mg Oral BID WC   ezetimibe   10 mg Oral Daily   furosemide   80 mg Intravenous Once   heparin   5,000 Units Subcutaneous Q8H   insulin  aspart  0-15 Units Subcutaneous TID WC   isosorbide  dinitrate  20 mg Oral TID   sodium chloride  flush  3 mL Intravenous Q12H   torsemide   20 mg Oral BID   Continuous Infusions:   LOS: 0 days    Time spent: over 30 min    Greg Monte, MD [facility]   To contact the attending provider between 7A-7P or the covering provider during after hours 7P-7A, please log into the web site [email or web address] and  access using universal [place] password for that web site. If you do not have the password, please call the hospital operator.  01/09/2024, 8:17 AM

## 2024-01-09 NOTE — Progress Notes (Signed)
 Cardiologist:  Santo  Subjective:  Denies SSCP, palpitations or Dyspnea Long discussion about his anemia and renal failure   Objective:  Vitals:   01/09/24 0354 01/09/24 0400 01/09/24 0407 01/09/24 0500  BP: (!) 192/112 (!) 194/99 (!) 194/99 (!) 168/99  Pulse: 89 88  86  Resp: 20 17  18   Temp: 98.9 F (37.2 C) 98.9 F (37.2 C)    TempSrc: Oral Oral    SpO2: 97% 98%  92%  Weight:      Height:        Intake/Output from previous day:  Intake/Output Summary (Last 24 hours) at 01/09/2024 0812 Last data filed at 01/09/2024 0400 Gross per 24 hour  Intake 655 ml  Output 1250 ml  Net -595 ml    Physical Exam: Overweight black male Lungs clear No rub Abdomen benign Trace edema  Lab Results: Basic Metabolic Panel: Recent Labs    01/08/24 1208 01/08/24 1645 01/09/24 0633  NA 143  --  142  K 4.1  --  3.9  CL 111  --  110  CO2 20*  --  20*  GLUCOSE 134*  --  121*  BUN 68*  --  63*  CREATININE 6.61*  --  6.50*  CALCIUM  9.0  --  9.2  MG  --  2.7*  --    Liver Function Tests: Recent Labs    01/08/24 1208 01/09/24 0633  AST 17 15  ALT 28 23  ALKPHOS 86 81  BILITOT 1.3* 1.4*  PROT 7.0 6.6  ALBUMIN 3.5 3.3*   No results for input(s): LIPASE, AMYLASE in the last 72 hours. CBC: Recent Labs    01/08/24 1208 01/09/24 0633  WBC 6.7 7.4  NEUTROABS 5.0  --   HGB 7.4* 9.2*  HCT 24.4* 29.0*  MCV 94.2 91.5  PLT 144* 155   Cardiac Enzymes: No results for input(s): CKTOTAL, CKMB, CKMBINDEX, TROPONINI in the last 72 hours. BNP: Invalid input(s): POCBNP D-Dimer: No results for input(s): DDIMER in the last 72 hours. Hemoglobin A1C: No results for input(s): HGBA1C in the last 72 hours. Fasting Lipid Panel: No results for input(s): CHOL, HDL, LDLCALC, TRIG, CHOLHDL, LDLDIRECT in the last 72 hours. Thyroid Function Tests: No results for input(s): TSH, T4TOTAL, T3FREE, THYROIDAB in the last 72 hours.  Invalid  input(s): FREET3 Anemia Panel: No results for input(s): VITAMINB12, FOLATE, FERRITIN, TIBC, IRON , RETICCTPCT in the last 72 hours.  Imaging: DG Chest Port 1 View Result Date: 01/08/2024 CLINICAL DATA:  sob EXAM: PORTABLE CHEST - 1 VIEW COMPARISON:  03/25/2023 FINDINGS: Lungs are clear. Interval globular enlargement of the cardiac silhouette, cannot exclude pericardial effusion. Pulmonary vascularity normal. Mediastinal contour normal. No effusion. Visualized bones unremarkable. IMPRESSION: Globular enlargement of the cardiac silhouette, cannot exclude pericardial effusion. Electronically Signed   By: JONETTA Faes M.D.   On: 01/08/2024 13:59    Cardiac Studies:  ECG: SR rate 73 poor R wave progression   Telemetry: NSR   Echo: pending  Medications:    sodium chloride    Intravenous Once   aspirin  EC  81 mg Oral Daily   atorvastatin   80 mg Oral Daily   carvedilol   25 mg Oral BID WC   ezetimibe   10 mg Oral Daily   furosemide   80 mg Intravenous Once   heparin   5,000 Units Subcutaneous Q8H   insulin  aspart  0-15 Units Subcutaneous TID WC   isosorbide  dinitrate  20 mg Oral TID   sodium chloride  flush  3 mL  Intravenous Q12H   torsemide   20 mg Oral BID      Assessment/Plan:   Cardiomegaly:  on CXR TTE pending ? Pericardial effusion vs DCM. Both likely due to poorly controlled HTN. Clinically no signs of tamponade.  CRF Follows with Dr Dalene CR 6.5 with BUN 53. Uremic symptoms with likely pericardial effusion. Would be beneficial to consider fistula acces and vascular consult this admission.  Anemia:   transfusing a unit Hct 24.4 FOB needs testing component of renal failure would consider starting erythropoietin per renal HTN:  on torsemide  , nitrates and coreg  can add hydralazine  as indicated by primary service and then combine with nitrates with bidil    Maude Emmer 01/09/2024, 8:12 AM

## 2024-01-09 NOTE — Progress Notes (Signed)
 Echocardiogram 2D Echocardiogram has been performed.  [name] 01/09/2024, 9:10 AM

## 2024-01-09 NOTE — Consult Note (Addendum)
 Cardiology Consultation:   Patient ID: [name] MRN: [id number]; DOB: May 18, 1986  Admit date: 01/08/2024 Date of Consult: 01/09/2024  Primary Care Provider: Marvene Prentice SAUNDERS, FNP [facility] Cardiologist: Stanly DELENA Leavens, MD  [facility] Electrophysiologist:  None   Patient Profile:   [name] is a 38 y.o. male with CAD s/p LAD PCI, CKD5, HFrEF, HTN, DM2, HLD and obesity who is being seen today for the evaluation of pericardial effusion at the request of Dr. Seena.  History of Present Illness:   [name] felt generally unwell over the past 3 days with nausea, malaise and fatigue.  He has also had associated dyspnea on exertion during the same time.  Additionally he had chest discomfort which he described as annoying but not painful that lasted for a few minutes at a time and was unrelated to activity. This discomfort radiated to his L scapula. He was at church when it occurred.   On ED arrival he was HTN.  VS: BP 206/110, P 86, T 44F, RR 16, O2 100%/RA. Significant HTN is abnormal for him.  Labs notable for anemia (Hb 7.4, recent baseline 8.2-8.3). hsT ruled out (14->15). BNP elevated (698, distant prior 18-130). RVP negative for Covid/flu.   During my evaluatuin he was resting comfortably in bed.  VS: BP 168/99 (119), P 86, RR 20, O2 97%.   He is followed by Dr. Leavens and was last seen by Glendia Ferrier on 09/14/23.  He had intentionally lost around 80 pounds which had resulted in improved blood pressure control.  Repeat TTE was scheduled for last week but he missed his appointment.  No recurrent anginal equivalents since his MRI in 2021 where he had PCI to the LAD with a late presenting MI.   Past Medical History:  Diagnosis Date   Coronary artery disease    Diabetes mellitus without complication (HCC)    Hypercholesteremia    Hypertension    Myocardial infarction (HCC) 01/2020   Past Surgical History:  Procedure Laterality Date    CORONARY STENT INTERVENTION N/A 01/28/2020   Procedure: CORONARY STENT INTERVENTION;  Surgeon: Dann Candyce RAMAN, MD;  Location: [facility] INVASIVE CV LAB;  Service: Cardiovascular;  Laterality: N/A;   CORONARY/GRAFT ACUTE MI REVASCULARIZATION N/A 01/28/2020   Procedure: Coronary/Graft Acute MI Revascularization;  Surgeon: Dann Candyce RAMAN, MD;  Location: [facility] INVASIVE CV LAB;  Service: Cardiovascular;  Laterality: N/A;   LEFT HEART CATH AND CORONARY ANGIOGRAPHY N/A 01/28/2020   Procedure: LEFT HEART CATH AND CORONARY ANGIOGRAPHY;  Surgeon: Dann Candyce RAMAN, MD;  Location: [facility] INVASIVE CV LAB;  Service: Cardiovascular;  Laterality: N/A;    Home Medications:  Prior to Admission medications   Medication Sig Start Date End Date Taking? Authorizing Provider  aspirin  EC 81 MG tablet Take 81 mg by mouth daily. Swallow whole.   Yes [provider]  atorvastatin  (LIPITOR ) 80 MG tablet TAKE 1 TABLET BY MOUTH EVERY DAY 11/14/23  Yes Chandrasekhar, Mahesh A, MD  carvedilol  (COREG ) 25 MG tablet TAKE 1 TABLET BY MOUTH TWICE A DAY IN THE MORNING AND IN THE EVENING 04/28/23  Yes Chandrasekhar, Mahesh A, MD  ezetimibe  (ZETIA ) 10 MG tablet Take 1 tablet (10 mg total) by mouth daily. 04/22/23  Yes Chandrasekhar, Mahesh A, MD  hydrALAZINE  (APRESOLINE ) 50 MG tablet TAKE 1 & 1/2 TABLETS BY MOUTH 3 TIMES DAILY 12/13/23  Yes Chandrasekhar, Mahesh A, MD  isosorbide  dinitrate (ISORDIL ) 20 MG tablet TAKE 1 TABLET BY MOUTH THREE TIMES A DAY 05/06/23  Yes Chandrasekhar, Mahesh A, MD  nitroGLYCERIN  (NITROSTAT ) 0.4 MG SL tablet Place 1 tablet (0.4 mg total) under the tongue every 5 (five) minutes as needed for chest pain. 02/02/21 01/08/24 Yes Dann Candyce RAMAN, MD  torsemide  (DEMADEX ) 20 MG tablet Take 20 mg by mouth 2 (two) times daily. 04/16/23  Yes [provider]  doxycycline  (VIBRA -TABS) 100 MG tablet Take 1 tablet (100 mg total) by mouth 2 (two) times daily. Patient not taking: Reported on 01/08/2024 09/15/23    Gershon Donnice SAUNDERS, DPM  JARDIANCE  10 MG TABS tablet Take 10 mg by mouth daily. Patient not taking: Reported on 01/08/2024 09/16/23   [provider]   Inpatient Medications: Scheduled Meds:  sodium chloride    Intravenous Once   heparin   5,000 Units Subcutaneous Q8H   insulin  aspart  0-15 Units Subcutaneous TID WC   sodium chloride  flush  3 mL Intravenous Q12H   Continuous Infusions:  PRN Meds: acetaminophen  **OR** acetaminophen , hydrALAZINE , polyethylene glycol, prochlorperazine   Allergies:   No Known Allergies  Social History:   Social History   Socioeconomic History   Marital status: Married    Spouse name: Not on file   Number of children: 2   Years of education: Not on file   Highest education level: Not on file  Occupational History   Not on file  Tobacco Use   Smoking status: Never   Smokeless tobacco: Never  Vaping Use   Vaping status: Never Used  Substance and Sexual Activity   Alcohol use: No   Drug use: No   Sexual activity: Not on file  Other Topics Concern   Not on file  Social History Narrative   Not on file   Social Drivers of Health   Financial Resource Strain: Not on file  Food Insecurity: No Food Insecurity (01/09/2024)   Hunger Vital Sign    Worried About Running Out of Food in the Last Year: Never true    Ran Out of Food in the Last Year: Never true  Transportation Needs: No Transportation Needs (01/09/2024)   PRAPARE - Administrator, Civil Service (Medical): No    Lack of Transportation (Non-Medical): No  Physical Activity: Not on file  Stress: Not on file  Social Connections: Not on file  Intimate Partner Violence: Not At Risk (01/09/2024)   Humiliation, Afraid, Rape, and Kick questionnaire    Fear of Current or Ex-Partner: No    Emotionally Abused: No    Physically Abused: No    Sexually Abused: No    Family History:    Family History  Problem Relation Age of Onset   Diabetes Father    Heart disease Father     Stroke Maternal Grandmother 80    ROS:  Review of Systems: [y] = yes, [ ]  = no      General: Weight gain [ ] ; Weight loss [ ] ; Anorexia [ ] ; Fatigue [ ] ; Fever [ ] ; Chills [ ] ; Weakness [ ]    Cardiac: Chest pain/pressure [y]; Resting SOB [ ] ; Exertional SOB [y]; Orthopnea [ ] ; Pedal Edema [ ] ; Palpitations [ ] ; Syncope [ ] ; Presyncope [ ] ; Paroxysmal nocturnal dyspnea [ ]    Pulmonary: Cough [ ] ; Wheezing [ ] ; Hemoptysis [ ] ; Sputum [ ] ; Snoring [ ]    GI: Vomiting [ ] ; Dysphagia [ ] ; Melena [ ] ; Hematochezia [ ] ; Heartburn [ ] ; Abdominal pain [ ] ; Constipation [ ] ; Diarrhea [ ] ; BRBPR [ ]    GU: Hematuria [ ] ;  Dysuria [ ] ; Nocturia [ ]  Vascular: Pain in legs with walking [ ] ; Pain in feet with lying flat [ ] ; Non-healing sores [ ] ; Stroke [ ] ; TIA [ ] ; Slurred speech [ ] ;   Neuro: Headaches [ ] ; Vertigo [ ] ; Seizures [ ] ; Paresthesias [ ] ;Blurred vision [ ] ; Diplopia [ ] ; Vision changes [ ]    Ortho/Skin: Arthritis [ ] ; Joint pain [ ] ; Muscle pain [ ] ; Joint swelling [ ] ; Back Pain [ ] ; Rash [ ]    Psych: Depression [ ] ; Anxiety [ ]    Heme: Bleeding problems [ ] ; Clotting disorders [ ] ; Anemia [ ]    Endocrine: Diabetes [ ] ; Thyroid dysfunction [ ]    Physical Exam/Data:   Vitals:   01/09/24 0354 01/09/24 0400 01/09/24 0407 01/09/24 0500  BP: (!) 192/112 (!) 194/99 (!) 194/99 (!) 168/99  Pulse: 89 88  86  Resp: 20 17  18   Temp: 98.9 F (37.2 C) 98.9 F (37.2 C)    TempSrc: Oral Oral    SpO2: 97% 98%  92%  Weight:      Height:        Intake/Output Summary (Last 24 hours) at 01/09/2024 9367 Last data filed at 01/09/2024 0400 Gross per 24 hour  Intake 655 ml  Output 1250 ml  Net -595 ml      01/08/2024   11:56 AM 09/14/2023    9:00 AM 05/19/2023    8:50 AM  Last 3 Weights  Weight (lbs) 275 lb 274 lb 6.4 oz 290 lb 9.6 oz  Weight (kg) 124.739 kg 124.467 kg 131.815 kg     Body mass index is 33.47 kg/m.  General:  Well nourished, well developed, in no acute distress HEENT:  normal Vascular: No carotid bruits; FA pulses 2+ bilaterally without bruits  Cardiac:  normal S1, S2; RRR; no murmur  Lungs:  clear to auscultation bilaterally, no wheezing, rhonchi or rales  Abd: soft, nontender, no hepatomegaly  Ext: 1+ b/l LE edema Musculoskeletal:  No deformities, BUE and BLE strength normal and equal Skin: warm and dry  Neuro:  CNs 2-12 intact, no focal abnormalities noted Psych:  Normal affect   EKG:  The EKG (01/08/24, 12:16:47) was personally reviewed and demonstrates: NSR 75, PR 165, QRS 93, QT/c 443/495, no ischemic changes Telemetry: Telemetry was personally reviewed and demonstrates: NSR 70s  Relevant CV Studies: TTE Result date: 04/01/20  1. Apical akinesis. Severe hypokinesis of the inferoseptum and apical  anteroseptum. Left ventricular ejection fraction, by estimation, is 35 to  40%. The left ventricle has moderately decreased function. The left  ventricle demonstrates regional wall  motion abnormalities (see scoring diagram/findings for description). There  is mild left ventricular hypertrophy of the posterior segment. Left  ventricular diastolic parameters are consistent with Grade I diastolic  dysfunction (impaired relaxation).   2. Right ventricular systolic function is normal. The right ventricular  size is normal.   3. The mitral valve is normal in structure. Trivial mitral valve  regurgitation. No evidence of mitral stenosis.   4. The aortic valve is tricuspid. Aortic valve regurgitation is not  visualized. No aortic stenosis is present.   5. The inferior vena cava is normal in size with greater than 50%  respiratory variability, suggesting right atrial pressure of 3 mmHg.   Coronary angiography with PCI  Result date: 01/28/20 Prox LAD lesion is 100% stenosed. Late presenting anterior MI, as his symptoms have been going on for about 24 hours. A drug-eluting stent was  successfully placed using a SYNERGY XD [id number], postdilated to greater  than 3 mm in diameter. Post intervention, there is a 0% residual stenosis. There is moderate to severe left ventricular systolic dysfunction with anterior wall hypokinesis. LV end diastolic pressure is mildly elevated. The left ventricular ejection fraction is 25-35% by visual estimate. There is no aortic valve stenosis.  Laboratory Data:  High Sensitivity Troponin:   Recent Labs  Lab 01/08/24 1208 01/08/24 1645  TROPONINIHS 14 15     Chemistry Recent Labs  Lab 01/08/24 1208  NA 143  K 4.1  CL 111  CO2 20*  GLUCOSE 134*  BUN 68*  CREATININE 6.61*  CALCIUM  9.0  GFRNONAA 10*  ANIONGAP 12    Recent Labs  Lab 01/08/24 1208  PROT 7.0  ALBUMIN 3.5  AST 17  ALT 28  ALKPHOS 86  BILITOT 1.3*   Hematology Recent Labs  Lab 01/08/24 1208  WBC 6.7  RBC 2.59*  HGB 7.4*  HCT 24.4*  MCV 94.2  MCH 28.6  MCHC 30.3  RDW 13.8  PLT 144*   BNP Recent Labs  Lab 01/08/24 1208  BNP 698.2*    Radiology/Studies:  DG Chest Port 1 View Result Date: 01/08/2024 CLINICAL DATA:  sob EXAM: PORTABLE CHEST - 1 VIEW COMPARISON:  03/25/2023 FINDINGS: Lungs are clear. Interval globular enlargement of the cardiac silhouette, cannot exclude pericardial effusion. Pulmonary vascularity normal. Mediastinal contour normal. No effusion. Visualized bones unremarkable. IMPRESSION: Globular enlargement of the cardiac silhouette, cannot exclude pericardial effusion. Electronically Signed   By: JONETTA Faes M.D.   On: 01/08/2024 13:59   Assessment and Plan:  [name] is a 38 y.o. male with CAD s/p LAD PCI, CKD5, HFrEF, HTN, DM2, HLD and obesity who presents with general malaise, fatigue and DOE with incidental pericardial effusion.   Pericardial effusion  New on CXR from 03/25/23 comparison. Bedside TTE prior to transfer with moderate effusion and stable LVEF from prior. Repeat TTE pending. No signs of tamponade or pericarditis. No recent infections. BUN 68 and 74 3 months ago, prior  40-50s. With CKD5 could be uremic effusion. Sx seem to be improving with better BP control. No indication right now for intervention but will need to reassess sx after diuresis now that BP better controlled. If significant SOB at rest and/orDOE and if LVEF stable then may need to consider draining depending on size and his sx. He can lay back to 15-20 degrees right now comfortably but his SOB is worse with supine position.  - TTE pending   DOE HFrEF HTN urgency  Prescribed torsemide  20 mg daily at home. Doesn't check wt regularly, has some LE edema but not significant. Doesn't seem profoundly volume overload but does have elevated BNP which is harder to interpret in the setting of moderate effusion. May spot dose lasix  80 mg IV to see if we can get some fluid off while he is here. I don't think this is the primary driver of his presentation.  - ordered OP torsemide  20 mg PO bid to start 07/28 PM, lasix  80 mg IV x1 07/28 AM - ordered OP coreg  25 mg PO bid  - ordered OP isordil  20 mg PO tid   CAD s/p LAD PCI  HLD sBP consistently 200s on admission, now better controlled and asx. I don't think ACS was driving his presentation especially with flat hsT despite HTN urgency.  - ordered OP ASA 81 mg PO daily - ordered OP atorvastatin  80 mg PO qAM  -  ordered OP zetia  10 mg PO qAM  CKD5 Currently labs stable from prior.   For questions or updates, please contact [facility] Please consult [email or web address] for contact info under   Signed, Donnice DELENA Primus, MD  01/09/2024 6:32 AM

## 2024-01-09 NOTE — Consult Note (Addendum)
 [facility] Renal Consultation Note  Requesting MD: Dr. Perri  Indication for Consultation: AKI   I have independently taken a history, reviewed the chart and examined the patient. This patient encounter includes evaluation, adjustment of therapies in at least one of the key components with review of  Dr. Laray note, impression and recommendations.    I've also already edited and made adjustments to the above mentioned note.  Assessment and Plan: 1) CKD Stage V Presented with Scr 6.61, BUN 68, eGFR 10 with symptoms of DOE and orthopnea suspicious for uremic etiology d/y severely reduced kidney function resulting in volume overload. Home medication consisted of Torsemide  20 mg BID. S/p IV Lasix  80 mg by primary with Total UOP 1250, net -595, responding well.    - Diuresis:  - Patient at baseline renal function; Scr 6.50 (6.61), BUN 63 (68)  - Would continue IV lasix  80 mg q12hrs and restart torsemide  in the next 48hrs based on clinical response  - Sodium restriction and fluid restriction   - Strict Is and Os with daily weights   -No absolute indications for dialysis, very soft uremic symptoms; however we will follow-up ECHO findings.  Would like to avoid initiating dialysis unless there is an absolute indication.  Patient has already been options class and prefers PD but will need his home situation evaluated first.  Once he is discharged we will also refer for preemptive renal transplantation evaluation at [facility].  2) Anemia of Chronic kidney disease  Started EPO injections every two 2 weeks, missed the last dose on 6/17; baseline Hgb ~ 9. Patient p/w Hgb 7.4 s/p 1 pRBC, not Hgb 9.2, stable.  - Will restart EPO injections and check iron  panels - Monitor Hgb daily   3) HFrEF G1 DD  4) Hypertensive Urgency  Home medication consist of Coreg  25 BID, Hydralazine  75 TID, IMDUR  20 mg TID, Torsemide  20 mg BID. Blood pressures stable today. On exam, LE edema present.    HPI:  [name] is a 38 y.o. male with past medical history of hypertension, type 2 diabetes, hyperlipidemia, CAD s/p stent, CKD stage 5 follows w/ Dr. Macel in the outpatient setting, HFrEF w/ EF 35-40% 03/2020 p/w symptoms of CP, DOE and orthopnea x 1 week, admitted for symptomatic anemia with Hgb 7.4 S/p 1 pRBC. Denies any urinary symptoms. Labs pertinent for Scr 6.61, eGFR 10 on admission with baseline Scr ~ 5.11 - 6.63. Denies any melena, hematochezia, or hemoptysis. Reports that he missed his last EPO injection 6/17 because he was out of town; has not been able to reschedule an appointment. Presently reports that he has feels better, has been urinating well without any concerns.   Creat  Date/Time Value Ref Range Status  01/09/2018 05:16 PM 1.24 0.60 - 1.35 mg/dL Final   Creatinine, Ser  Date/Time Value Ref Range Status  01/09/2024 06:33 AM 6.50 (H) 0.61 - 1.24 mg/dL Final  92/72/7974 87:91 PM 6.61 (H) 0.61 - 1.24 mg/dL Final  95/97/7974 89:81 AM 6.63 (H) 0.76 - 1.27 mg/dL Final  89/88/7975 91:81 PM 5.11 (H) 0.61 - 1.24 mg/dL Final  90/86/7976 94:56 AM 3.80 (H) 0.61 - 1.24 mg/dL Final  90/86/7976 94:69 AM 3.57 (H) 0.61 - 1.24 mg/dL Final  91/96/7976 97:47 PM 4.57 (H) 0.61 - 1.24 mg/dL Final  93/71/7976 91:50 PM 3.67 (H) 0.61 - 1.24 mg/dL Final  95/78/7976 89:79 AM 3.26 (H) 0.61 - 1.24 mg/dL Final  95/88/7976 91:98 PM 3.09 (H) 0.61 - 1.24 mg/dL  Final  08/29/2021 02:37 AM 2.62 (H) 0.61 - 1.24 mg/dL Final  96/82/7976 91:66 AM 2.48 (H) 0.61 - 1.24 mg/dL Final  88/81/7977 91:71 AM 2.84 (H) 0.61 - 1.24 mg/dL Final  91/84/7977 88:70 AM 1.71 (H) 0.61 - 1.24 mg/dL Final  92/88/7977 93:66 AM 1.91 (H) 0.61 - 1.24 mg/dL Final  94/80/7977 96:82 AM 1.70 (H) 0.61 - 1.24 mg/dL Final  98/98/7977 88:89 AM 1.75 (H) 0.61 - 1.24 mg/dL Final  87/95/7978 90:97 AM 1.46 (H) 0.61 - 1.24 mg/dL Final  89/80/7978 90:43 AM 2.17 (H) 0.76 - 1.27 mg/dL Final  91/75/7978 88:79 AM 2.37 (H) 0.76 - 1.27  mg/dL Final  91/79/7978 95:53 AM 5.64 (H) 0.61 - 1.24 mg/dL Final  91/80/7978 95:79 AM 7.44 (H) 0.61 - 1.24 mg/dL Final  91/81/7978 91:69 AM 6.22 (H) 0.61 - 1.24 mg/dL Final    Comment:    DELTA CHECK NOTED  01/29/2020 07:31 AM 3.41 (H) 0.61 - 1.24 mg/dL Final    Comment:    DELTA CHECK NOTED  01/28/2020 01:15 PM 2.00 (H) 0.61 - 1.24 mg/dL Final  91/83/7978 87:57 PM 2.28 (H) 0.61 - 1.24 mg/dL Final  97/82/7978 95:70 PM 1.19 0.61 - 1.24 mg/dL Final  95/91/7979 88:52 AM 1.31 (H) 0.61 - 1.24 mg/dL Final  97/93/7979 97:82 AM 1.81 (H) 0.61 - 1.24 mg/dL Final  97/94/7979 95:70 PM 2.64 (H) 0.61 - 1.24 mg/dL Final  98/87/7979 92:89 PM 1.24 0.61 - 1.24 mg/dL Final  95/97/7980 91:92 PM 1.17 0.61 - 1.24 mg/dL Final  96/85/7980 98:83 PM 1.10 0.61 - 1.24 mg/dL Final  96/97/7980 96:59 PM 1.35 (H) 0.61 - 1.24 mg/dL Final  94/94/7981 97:87 PM 1.51 (H) 0.61 - 1.24 mg/dL Final  97/72/7983 98:92 PM 1.14 0.50 - 1.35 mg/dL Final  98/91/7987 94:97 AM 0.80 0.4 - 1.5 mg/dL Final  98/92/7987 96:97 AM 0.80 0.4 - 1.5 mg/dL Final  98/92/7987 87:84 AM 0.8 0.4 - 1.5 mg/dL Final     PMHx:   Past Medical History:  Diagnosis Date   Coronary artery disease    Diabetes mellitus without complication (HCC)    Hypercholesteremia    Hypertension    Myocardial infarction (HCC) 01/2020    Past Surgical History:  Procedure Laterality Date   CORONARY STENT INTERVENTION N/A 01/28/2020   Procedure: CORONARY STENT INTERVENTION;  Surgeon: Dann Candyce RAMAN, MD;  Location: [facility] INVASIVE CV LAB;  Service: Cardiovascular;  Laterality: N/A;   CORONARY/GRAFT ACUTE MI REVASCULARIZATION N/A 01/28/2020   Procedure: Coronary/Graft Acute MI Revascularization;  Surgeon: Dann Candyce RAMAN, MD;  Location: [facility] INVASIVE CV LAB;  Service: Cardiovascular;  Laterality: N/A;   LEFT HEART CATH AND CORONARY ANGIOGRAPHY N/A 01/28/2020   Procedure: LEFT HEART CATH AND CORONARY ANGIOGRAPHY;  Surgeon: Dann Candyce RAMAN, MD;  Location: [facility]  INVASIVE CV LAB;  Service: Cardiovascular;  Laterality: N/A;    Family Hx:  Family History  Problem Relation Age of Onset   Diabetes Father    Heart disease Father    Stroke Maternal Grandmother 73    Social History:  reports that he has never smoked. He has never used smokeless tobacco. He reports that he does not drink alcohol and does not use drugs.  Allergies: No Known Allergies  Medications: Prior to Admission medications   Medication Sig Start Date End Date Taking? Authorizing Provider  aspirin  EC 81 MG tablet Take 81 mg by mouth daily. Swallow whole.   Yes [provider]  atorvastatin  (LIPITOR ) 80 MG tablet TAKE 1 TABLET BY  MOUTH EVERY DAY 11/14/23  Yes Chandrasekhar, Mahesh A, MD  carvedilol  (COREG ) 25 MG tablet TAKE 1 TABLET BY MOUTH TWICE A DAY IN THE MORNING AND IN THE EVENING 04/28/23  Yes Chandrasekhar, Mahesh A, MD  ezetimibe  (ZETIA ) 10 MG tablet Take 1 tablet (10 mg total) by mouth daily. 04/22/23  Yes Chandrasekhar, Mahesh A, MD  hydrALAZINE  (APRESOLINE ) 50 MG tablet TAKE 1 & 1/2 TABLETS BY MOUTH 3 TIMES DAILY 12/13/23  Yes Chandrasekhar, Mahesh A, MD  isosorbide  dinitrate (ISORDIL ) 20 MG tablet TAKE 1 TABLET BY MOUTH THREE TIMES A DAY 05/06/23  Yes Chandrasekhar, Mahesh A, MD  nitroGLYCERIN  (NITROSTAT ) 0.4 MG SL tablet Place 1 tablet (0.4 mg total) under the tongue every 5 (five) minutes as needed for chest pain. 02/02/21 01/08/24 Yes Dann Candyce RAMAN, MD  torsemide  (DEMADEX ) 20 MG tablet Take 20 mg by mouth 2 (two) times daily. 04/16/23  Yes [provider]  doxycycline  (VIBRA -TABS) 100 MG tablet Take 1 tablet (100 mg total) by mouth 2 (two) times daily. Patient not taking: Reported on 01/08/2024 09/15/23   Gershon Donnice SAUNDERS, DPM  JARDIANCE  10 MG TABS tablet Take 10 mg by mouth daily. Patient not taking: Reported on 01/08/2024 09/16/23   [provider]    I have reviewed the patient's current medications. Patient reports that he is not taking  Jardiance ; but otherwise compliant on all medications.   Scheduled:  sodium chloride    Intravenous Once   aspirin  EC  81 mg Oral Daily   atorvastatin   80 mg Oral Daily   carvedilol   25 mg Oral BID WC   ezetimibe   10 mg Oral Daily   heparin   5,000 Units Subcutaneous Q8H   hydrALAZINE   75 mg Oral Q8H   insulin  aspart  0-15 Units Subcutaneous TID WC   isosorbide  dinitrate  20 mg Oral TID   sodium chloride  flush  3 mL Intravenous Q12H   torsemide   20 mg Oral BID   Continuous: None  PRN:acetaminophen  **OR** acetaminophen , hydrALAZINE , perflutren  lipid microspheres (DEFINITY ) IV suspension, polyethylene glycol, prochlorperazine    Labs:  Results for orders placed or performed during the hospital encounter of 01/08/24 (from the past 48 hours)  CBC with Differential     Status: Abnormal   Collection Time: 01/08/24 12:08 PM  Result Value Ref Range   WBC 6.7 4.0 - 10.5 K/uL   RBC 2.59 (L) 4.22 - 5.81 MIL/uL   Hemoglobin 7.4 (L) 13.0 - 17.0 g/dL   HCT 75.5 (L) 60.9 - 47.9 %   MCV 94.2 80.0 - 100.0 fL   MCH 28.6 26.0 - 34.0 pg   MCHC 30.3 30.0 - 36.0 g/dL   RDW 86.1 88.4 - 84.4 %   Platelets 144 (L) 150 - 400 K/uL   nRBC 0.0 0.0 - 0.2 %   Neutrophils Relative % 74 %   Neutro Abs 5.0 1.7 - 7.7 K/uL   Lymphocytes Relative 16 %   Lymphs Abs 1.1 0.7 - 4.0 K/uL   Monocytes Relative 7 %   Monocytes Absolute 0.5 0.1 - 1.0 K/uL   Eosinophils Relative 2 %   Eosinophils Absolute 0.1 0.0 - 0.5 K/uL   Basophils Relative 1 %   Basophils Absolute 0.1 0.0 - 0.1 K/uL   Immature Granulocytes 0 %   Abs Immature Granulocytes 0.02 0.00 - 0.07 K/uL    Comment: Performed at [facility], [address], [place], [place] [address]  Comprehensive metabolic panel     Status: Abnormal  Collection Time: 01/08/24 12:08 PM  Result Value Ref Range   Sodium 143 135 - 145 mmol/L   Potassium 4.1 3.5 - 5.1 mmol/L   Chloride 111 98 - 111 mmol/L   CO2 20 (L) 22 - 32 mmol/L   Glucose, Bld 134  (H) 70 - 99 mg/dL    Comment: Glucose reference range applies only to samples taken after fasting for at least 8 hours.   BUN 68 (H) 6 - 20 mg/dL   Creatinine, Ser 3.38 (H) 0.61 - 1.24 mg/dL   Calcium  9.0 8.9 - 10.3 mg/dL   Total Protein 7.0 6.5 - 8.1 g/dL   Albumin 3.5 3.5 - 5.0 g/dL   AST 17 15 - 41 U/L   ALT 28 0 - 44 U/L   Alkaline Phosphatase 86 38 - 126 U/L   Total Bilirubin 1.3 (H) 0.0 - 1.2 mg/dL   GFR, Estimated 10 (L) >60 mL/min    Comment: (NOTE) Calculated using the CKD-EPI Creatinine Equation (2021)    Anion gap 12 5 - 15    Comment: Performed at [facility], [address], [place], [place] [address]  Troponin I (High Sensitivity)     Status: None   Collection Time: 01/08/24 12:08 PM  Result Value Ref Range   Troponin I (High Sensitivity) 14 <18 ng/L    Comment: (NOTE) Elevated high sensitivity troponin I (hsTnI) values and significant  changes across serial measurements may suggest ACS but many other  chronic and acute conditions are known to elevate hsTnI results.  Refer to the Links section for chest pain algorithms and additional  guidance. Performed at [facility], [address], [place], [place] [address]   Brain natriuretic peptide     Status: Abnormal   Collection Time: 01/08/24 12:08 PM  Result Value Ref Range   B Natriuretic Peptide 698.2 (H) 0.0 - 100.0 pg/mL    Comment: Performed at [facility], [address] [place], [place] [address]  Resp panel by RT-PCR (RSV, Flu A&B, Covid) Anterior Nasal Swab     Status: None   Collection Time: 01/08/24  1:34 PM   Specimen: Anterior Nasal Swab  Result Value Ref Range   SARS Coronavirus 2 by RT PCR NEGATIVE NEGATIVE    Comment: (NOTE) SARS-CoV-2 target nucleic acids are NOT DETECTED.    Influenza A by PCR NEGATIVE NEGATIVE   Influenza B by PCR NEGATIVE NEGATIVE    Comment: (NOTE)    Resp Syncytial Virus by PCR NEGATIVE NEGATIVE    Comment:  (NOTE)    Troponin I (High Sensitivity)     Status: None   Collection Time: 01/08/24  4:45 PM  Result Value Ref Range   Troponin I (High Sensitivity) 15 <18 ng/L    Comment: (NOTE) Elevated high sensitivity troponin I (hsTnI) values and significant  changes across serial measurements may suggest ACS but many other  chronic and acute conditions are known to elevate hsTnI results.  Refer to the Links section for chest pain algorithms and additional  guidance. Performed at [facility], [address] [place], [place] [address]   Type and screen [facility]     Status: None   Collection Time: 01/08/24  4:45 PM  Result Value Ref Range   ABO/RH(D) O NEG    Antibody Screen NEG    Sample Expiration 01/11/2024,2359    Unit Number [id number]    Blood Component Type RED CELLS,LR  Unit division 00    Status of Unit ISSUED,FINAL    Transfusion Status OK TO TRANSFUSE    Crossmatch Result      Compatible Performed at [facility], [address], [place], [place] [address]   Prepare RBC (crossmatch)     Status: None   Collection Time: 01/08/24  4:45 PM  Result Value Ref Range   Order Confirmation      ORDER PROCESSED BY BLOOD BANK Performed at [facility], [address], [place], [place] [address]   Magnesium     Status: Abnormal   Collection Time: 01/08/24  4:45 PM  Result Value Ref Range   Magnesium 2.7 (H) 1.7 - 2.4 mg/dL    Comment: Performed at [facility], [address], [place], [place] [address]  ABO/Rh     Status: None   Collection Time: 01/08/24  5:58 PM  Result Value Ref Range   ABO/RH(D)      MALVA NEG Performed at [facility], [address] [place], [place] [address]   CBG monitoring, ED     Status: Abnormal   Collection Time: 01/08/24  7:54 PM  Result Value Ref Range   Glucose-Capillary 120 (H) 70 - 99 mg/dL    Comment: Glucose reference  range applies only to samples taken after fasting for at least 8 hours.  MRSA Next Gen by PCR, Nasal     Status: None   Collection Time: 01/09/24  3:59 AM   Specimen: Nasal Mucosa; Nasal Swab  Result Value Ref Range   MRSA by PCR Next Gen NOT DETECTED NOT DETECTED    Comment: (NOTE) The GeneXpert MRSA Assay (FDA approved for NASAL specimens only), is one component of a comprehensive MRSA colonization surveillance program. It is not intended to diagnose MRSA infection nor to guide or monitor treatment for MRSA infections. Test performance is not FDA approved in patients less than 71 years old. Performed at [facility], [address], [place], [place] [address]   Comprehensive metabolic panel     Status: Abnormal   Collection Time: 01/09/24  6:33 AM  Result Value Ref Range   Sodium 142 135 - 145 mmol/L   Potassium 3.9 3.5 - 5.1 mmol/L   Chloride 110 98 - 111 mmol/L   CO2 20 (L) 22 - 32 mmol/L   Glucose, Bld 121 (H) 70 - 99 mg/dL    Comment: Glucose reference range applies only to samples taken after fasting for at least 8 hours.   BUN 63 (H) 6 - 20 mg/dL   Creatinine, Ser 3.49 (H) 0.61 - 1.24 mg/dL   Calcium  9.2 8.9 - 10.3 mg/dL   Total Protein 6.6 6.5 - 8.1 g/dL   Albumin 3.3 (L) 3.5 - 5.0 g/dL   AST 15 15 - 41 U/L   ALT 23 0 - 44 U/L   Alkaline Phosphatase 81 38 - 126 U/L   Total Bilirubin 1.4 (H) 0.0 - 1.2 mg/dL   GFR, Estimated 10 (L) >60 mL/min    Comment: (NOTE) Calculated using the CKD-EPI Creatinine Equation (2021)    Anion gap 12 5 - 15    Comment: Performed at [facility], [address], [place], [place] [address]  CBC     Status: Abnormal   Collection Time: 01/09/24  6:33 AM  Result Value Ref Range   WBC 7.4 4.0 - 10.5 K/uL   RBC 3.17 (L) 4.22 - 5.81 MIL/uL  Hemoglobin 9.2 (L) 13.0 - 17.0 g/dL   HCT 70.9 (L) 60.9 - 47.9 %   MCV 91.5 80.0 - 100.0 fL   MCH 29.0 26.0 - 34.0 pg   MCHC 31.7 30.0 - 36.0 g/dL   RDW 85.8 88.4 - 84.4 %   Platelets  155 150 - 400 K/uL   nRBC 0.0 0.0 - 0.2 %    Comment: Performed at [facility], [address], [place], [place] [address]  Glucose, capillary     Status: Abnormal   Collection Time: 01/09/24  8:17 AM  Result Value Ref Range   Glucose-Capillary 108 (H) 70 - 99 mg/dL    Comment: Glucose reference range applies only to samples taken after fasting for at least 8 hours.     ROS:  Pertinent items are noted in HPI.   Physical Exam: Vitals:   01/09/24 0500 01/09/24 0820  BP: (!) 168/99 (!) 171/87  Pulse: 86 81  Resp: 18 14  Temp:  98.8 F (37.1 C)  SpO2: 92% 100%     General: Laying in bed with head of bead elevated, no obvious distress noted.  Heart: Regular Rate  Lungs: Breathing comfortably, no crackles noted.  Abdomen: soft, NT, ND. BS present Extremities: Warm to touch, perfusing well. 2+ RLE pitting edema and 1+ left ankle edema. Dp and radial pulses intact b/l.  Skin: warm to touch.  Neuro: Alert and answering questions appropriately, no focal deficits.      Toma Edwards PGY 2  01/09/2024, 10:12 AM

## 2024-01-09 NOTE — Plan of Care (Signed)
  Problem: Education: Goal: Ability to describe self-care measures that may prevent or decrease complications (Diabetes Survival Skills Education) will improve Outcome: Progressing   Problem: Coping: Goal: Ability to adjust to condition or change in health will improve Outcome: Progressing   Problem: Fluid Volume: Goal: Ability to maintain a balanced intake and output will improve Outcome: Progressing   Problem: Health Behavior/Discharge Planning: Goal: Ability to identify and utilize available resources and services will improve Outcome: Progressing   Problem: Nutritional: Goal: Maintenance of adequate nutrition will improve Outcome: Progressing

## 2024-01-10 DIAGNOSIS — D649 Anemia, unspecified: Secondary | ICD-10-CM | POA: Diagnosis not present

## 2024-01-10 DIAGNOSIS — I255 Ischemic cardiomyopathy: Secondary | ICD-10-CM

## 2024-01-10 LAB — CBC
HCT: 28.2 % — ABNORMAL LOW (ref 39.0–52.0)
Hemoglobin: 9 g/dL — ABNORMAL LOW (ref 13.0–17.0)
MCH: 29.1 pg (ref 26.0–34.0)
MCHC: 31.9 g/dL (ref 30.0–36.0)
MCV: 91.3 fL (ref 80.0–100.0)
Platelets: 156 K/uL (ref 150–400)
RBC: 3.09 MIL/uL — ABNORMAL LOW (ref 4.22–5.81)
RDW: 14 % (ref 11.5–15.5)
WBC: 7.4 K/uL (ref 4.0–10.5)
nRBC: 0 % (ref 0.0–0.2)

## 2024-01-10 LAB — BASIC METABOLIC PANEL WITH GFR
Anion gap: 12 (ref 5–15)
BUN: 67 mg/dL — ABNORMAL HIGH (ref 6–20)
CO2: 21 mmol/L — ABNORMAL LOW (ref 22–32)
Calcium: 8.8 mg/dL — ABNORMAL LOW (ref 8.9–10.3)
Chloride: 110 mmol/L (ref 98–111)
Creatinine, Ser: 6.3 mg/dL — ABNORMAL HIGH (ref 0.61–1.24)
GFR, Estimated: 11 mL/min — ABNORMAL LOW (ref >60)
Glucose, Bld: 107 mg/dL — ABNORMAL HIGH (ref 70–99)
Potassium: 3.6 mmol/L (ref 3.5–5.1)
Sodium: 143 mmol/L (ref 135–145)

## 2024-01-10 LAB — PHOSPHORUS: Phosphorus: 4.4 mg/dL (ref 2.5–4.6)

## 2024-01-10 LAB — IRON AND TIBC
Iron: 57 ug/dL (ref 45–182)
Saturation Ratios: 21 % (ref 17.9–39.5)
TIBC: 267 ug/dL (ref 250–450)
UIBC: 210 ug/dL

## 2024-01-10 LAB — FERRITIN: Ferritin: 148 ng/mL (ref 24–336)

## 2024-01-10 LAB — MAGNESIUM: Magnesium: 2.7 mg/dL — ABNORMAL HIGH (ref 1.7–2.4)

## 2024-01-10 LAB — GLUCOSE, CAPILLARY
Glucose-Capillary: 132 mg/dL — ABNORMAL HIGH (ref 70–99)
Glucose-Capillary: 115 mg/dL — ABNORMAL HIGH (ref 70–99)
Glucose-Capillary: 133 mg/dL — ABNORMAL HIGH (ref 70–99)
Glucose-Capillary: 117 mg/dL — ABNORMAL HIGH (ref 70–99)

## 2024-01-10 MED ORDER — IRON SUCROSE 200 MG IVPB - SIMPLE MED
200.0000 mg | Freq: Once | Status: AC
  Administered 2024-01-10: 200 mg via INTRAVENOUS
  Filled 2024-01-10: qty 200

## 2024-01-10 MED ORDER — DARBEPOETIN ALFA 100 MCG/0.5ML IJ SOSY
100.0000 ug | PREFILLED_SYRINGE | Freq: Once | INTRAMUSCULAR | Status: AC
  Administered 2024-01-10: 100 ug via SUBCUTANEOUS
  Filled 2024-01-10: qty 0.5

## 2024-01-10 NOTE — Plan of Care (Signed)
   Problem: Education: Goal: Ability to describe self-care measures that may prevent or decrease complications (Diabetes Survival Skills Education) will improve Outcome: Progressing   Problem: Coping: Goal: Ability to adjust to condition or change in health will improve Outcome: Progressing   Problem: Fluid Volume: Goal: Ability to maintain a balanced intake and output will improve Outcome: Progressing

## 2024-01-10 NOTE — Progress Notes (Addendum)
 Pt is a 38 y.o. yo male who was admitted on 01/08/2024 with  symptomatic anemia, we were consulted to manage CKD Stage V.   Assessment/Plan: 1. CKD Stage V Presented with Scr 6.61, BUN 68, eGFR 10 with symptoms of DOE and orthopnea suspicious for uremic etiology d/t severely reduced kidney function resulting in volume overload. Patient denies any changes in appetite, nausea or vomiting. Home medication consisted of Torsemide  20 mg BID. No current absolute indications for dialysis. ECHO with small pleural effusion. Patient has already been to options class and prefers PD, will need referral for home assessment that can be done in the outpatient setting.  - Patient at baseline renal function; Scr 6.30 (6.50), BUN 67 (63) - Electrolytes stable  - UOP 1.2L , net -1.0L - Continue IV Lasix  80 mg q12 hours to optimize diuresis, consider transitioning to PO torsemide  40 mg BID possibly tomorrow - Sodium restriction and fluid restriction - Strict Is and Os with daily weights - I have already contacted our scheduler to refer ASAP to home therapies for evaluation to determine if he is a candidate for PD.  Patient prefers to do PD.  From renal standpoint he is stable to go home with follow-up at [facility] with Dr. Macel  2. Anemia of Chronic Kidney Disease Started EPO injections every two 2 weeks, missed the last dose on 6/17; baseline Hgb ~ 9. Patient p/w Hgb 7.4 s/p 1 pRBC. - Hgb 9.0 (9.2) stable  - Will give Venofer  200mg  and then Aranesp  100 x1  3. Hypertension  - Manage by primary  4. HFrEF w/ small pericardial effusion - Manage by cards   I have independently taken a history, reviewed the chart and examined the patient. This patient encounter includes evaluation, adjustment of therapies in at least one of the key components with review of  Dr. Laray note, impression and recommendations.    I've also already edited and made adjustments to the above mentioned note.   MELIA LYNWOOD ORN,  MD 01/10/2024, 1:07 PM   Subjective: Patient is evaluated at bedside, denies any concerns. No nausea, vomiting, or abdominal pain. No CP or SOB. No abdominal pain.   Objective Vital signs in last 24 hours: Vitals:   01/10/24 0604 01/10/24 0605 01/10/24 0630 01/10/24 0830  BP: (!) 203/94 (!) 203/94 (!) 183/90 (!) 160/85  Pulse: 82  77 78  Resp: 18  18 13   Temp: 98 F (36.7 C)   98.1 F (36.7 C)  TempSrc: Oral   Oral  SpO2: 99%  98% 99%  Weight:      Height:       Weight change: 2.361 kg  Intake/Output Summary (Last 24 hours) at 01/10/2024 0927 Last data filed at 01/10/2024 0835 Gross per 24 hour  Intake 243 ml  Output 1975 ml  Net -1732 ml       Labs: Basic Metabolic Panel: Recent Labs  Lab 01/08/24 1208 01/09/24 0633 01/10/24 0522  NA 143 142 143  K 4.1 3.9 3.6  CL 111 110 110  CO2 20* 20* 21*  GLUCOSE 134* 121* 107*  BUN 68* 63* 67*  CREATININE 6.61* 6.50* 6.30*  CALCIUM  9.0 9.2 8.8*  PHOS  --   --  4.4   Liver Function Tests: Recent Labs  Lab 01/08/24 1208 01/09/24 0633  AST 17 15  ALT 28 23  ALKPHOS 86 81  BILITOT 1.3* 1.4*  PROT 7.0 6.6  ALBUMIN 3.5 3.3*   No results for input(s): LIPASE,  AMYLASE in the last 168 hours. No results for input(s): AMMONIA in the last 168 hours. CBC: Recent Labs  Lab 01/08/24 1208 01/09/24 0633 01/10/24 0522  WBC 6.7 7.4 7.4  NEUTROABS 5.0  --   --   HGB 7.4* 9.2* 9.0*  HCT 24.4* 29.0* 28.2*  MCV 94.2 91.5 91.3  PLT 144* 155 156   Cardiac Enzymes: No results for input(s): CKTOTAL, CKMB, CKMBINDEX, TROPONINI in the last 168 hours. CBG: Recent Labs  Lab 01/08/24 1954 01/09/24 0817 01/09/24 1347 01/09/24 1728 01/09/24 2008  GLUCAP 120* 108* 129* 117* 143*    Iron  Studies: No results for input(s): IRON , TIBC, TRANSFERRIN, FERRITIN in the last 72 hours. Studies/Results: ECHOCARDIOGRAM COMPLETE Result Date: 01/09/2024    ECHOCARDIOGRAM REPORT   Patient Name:   [name]  Date of Exam: 01/09/2024 Medical Rec #:  [id number]          Height:       76.0 in Accession #:    [id number]         Weight:       275.0 lb Date of Birth:  28-Feb-1986          BSA:          2.536 m Patient Age:    38 years           BP:           160/99 mmHg Patient Gender: M                  HR:           81 bpm. Exam Location:  Inpatient Procedure: 2D Echo, Cardiac Doppler, Color Doppler and Intracardiac            Opacification Agent (Both Spectral and Color Flow Doppler were            utilized during procedure). Indications:    CHF-Acute Systolic I50.21  History:        Patient has prior history of Echocardiogram examinations, most                 recent 04/01/2020. Cardiomegaly, Acute MI and CAD, CKD, stage 5,                 Signs/Symptoms:Chest Pain; Risk Factors:Hypertension and                 Dyslipidemia.  Sonographer:    Thea Norlander RCS Referring Phys: MARSA NOVAK MELVIN IMPRESSIONS  1. Left ventricular ejection fraction, by estimation, is 30 to 35%. The left ventricle has moderately decreased function. The left ventricle demonstrates regional wall motion abnormalities (see scoring diagram/findings for description). The left ventricular internal cavity size was moderately dilated. There is moderate asymmetric left ventricular hypertrophy of the infero-lateral segment. Left ventricular diastolic parameters are indeterminate.  2. Right ventricular systolic function is normal. The right ventricular size is normal. Tricuspid regurgitation signal is inadequate for assessing PA pressure.  3. Left atrial size was mildly dilated.  4. A small pericardial effusion is present. There is no evidence of cardiac tamponade.  5. The mitral valve is grossly normal. Mild mitral valve regurgitation. No evidence of mitral stenosis.  6. The aortic valve is tricuspid. Aortic valve regurgitation is trivial. No aortic stenosis is present.  7. The inferior vena cava is dilated in size with <50% respiratory variability,  suggesting right atrial pressure of 15 mmHg. FINDINGS  Left Ventricle: Left ventricular ejection fraction, by estimation, is 30 to  35%. The left ventricle has moderately decreased function. The left ventricle demonstrates regional wall motion abnormalities. Definity  contrast agent was given IV to delineate the left ventricular endocardial borders. The left ventricular internal cavity size was moderately dilated. There is moderate asymmetric left ventricular hypertrophy of the infero-lateral segment. Left ventricular diastolic parameters are indeterminate.  LV Wall Scoring: The mid and distal anterior septum, entire inferior wall, and apical lateral segment are akinetic. The mid inferolateral segment, mid inferoseptal segment, apical anterior segment, and basal inferoseptal segment are hypokinetic. The mid anterolateral segment is normal. Right Ventricle: The right ventricular size is normal. No increase in right ventricular wall thickness. Right ventricular systolic function is normal. Tricuspid regurgitation signal is inadequate for assessing PA pressure. Left Atrium: Left atrial size was mildly dilated. Right Atrium: Right atrial size was normal in size. Pericardium: A small pericardial effusion is present. There is no evidence of cardiac tamponade. Mitral Valve: The mitral valve is grossly normal. Mild mitral valve regurgitation. No evidence of mitral valve stenosis. Tricuspid Valve: The tricuspid valve is normal in structure. Tricuspid valve regurgitation is trivial. No evidence of tricuspid stenosis. Aortic Valve: The aortic valve is tricuspid. Aortic valve regurgitation is trivial. No aortic stenosis is present. Aortic valve peak gradient measures 7.8 mmHg. Pulmonic Valve: The pulmonic valve was normal in structure. Pulmonic valve regurgitation is trivial. No evidence of pulmonic stenosis. Aorta: The aortic root is normal in size and structure. Venous: The inferior vena cava is dilated in size with less than  50% respiratory variability, suggesting right atrial pressure of 15 mmHg. IAS/Shunts: No atrial level shunt detected by color flow Doppler.  LEFT VENTRICLE PLAX 2D LVIDd:         6.40 cm   Diastology LVIDs:         4.80 cm   LV e' medial:    5.00 cm/s LV PW:         1.50 cm   LV E/e' medial:  22.2 LV IVS:        1.00 cm   LV e' lateral:   7.29 cm/s LVOT diam:     2.30 cm   LV E/e' lateral: 15.2 LV SV:         94 LV SV Index:   37 LVOT Area:     4.15 cm  RIGHT VENTRICLE             IVC RV S prime:     16.90 cm/s  IVC diam: 2.40 cm TAPSE (M-mode): 2.4 cm LEFT ATRIUM             Index        RIGHT ATRIUM           Index LA diam:        4.70 cm 1.85 cm/m   RA Area:     18.60 cm LA Vol (A2C):   86.0 ml 33.91 ml/m  RA Volume:   51.50 ml  20.31 ml/m LA Vol (A4C):   73.3 ml 28.90 ml/m LA Biplane Vol: 84.3 ml 33.24 ml/m  AORTIC VALVE AV Area (Vmax): 3.03 cm AV Vmax:        140.00 cm/s AV Peak Grad:   7.8 mmHg LVOT Vmax:      102.00 cm/s LVOT Vmean:     69.600 cm/s LVOT VTI:       0.227 m  AORTA Ao Root diam: 3.40 cm Ao Asc diam:  3.40 cm MITRAL VALVE MV Area (PHT): 5.84 cm  SHUNTS MV Decel Time: 130 msec     Systemic VTI:  0.23 m MV E velocity: 111.00 cm/s  Systemic Diam: 2.30 cm MV A velocity: 99.10 cm/s MV E/A ratio:  1.12 Soyla Merck MD Electronically signed by Soyla Merck MD Signature Date/Time: 01/09/2024/1:47:23 PM    Final    DG Chest Port 1 View Result Date: 01/08/2024 CLINICAL DATA:  sob EXAM: PORTABLE CHEST - 1 VIEW COMPARISON:  03/25/2023 FINDINGS: Lungs are clear. Interval globular enlargement of the cardiac silhouette, cannot exclude pericardial effusion. Pulmonary vascularity normal. Mediastinal contour normal. No effusion. Visualized bones unremarkable. IMPRESSION: Globular enlargement of the cardiac silhouette, cannot exclude pericardial effusion. Electronically Signed   By: JONETTA Faes M.D.   On: 01/08/2024 13:59    Medications: Scheduled Medications:  sodium chloride    Intravenous  Once   aspirin  EC  81 mg Oral Daily   atorvastatin   80 mg Oral Daily   carvedilol   25 mg Oral BID WC   ezetimibe   10 mg Oral Daily   furosemide   80 mg Intravenous Q12H   heparin   5,000 Units Subcutaneous Q8H   hydrALAZINE   75 mg Oral Q8H   insulin  aspart  0-15 Units Subcutaneous TID WC   isosorbide  dinitrate  20 mg Oral TID   sodium chloride  flush  3 mL Intravenous Q12H   have reviewed scheduled and prn medications.  Physical Exam: General: Sitting at bedside  Heart: RR Lungs: Breathing comfortably  Abdomen: soft, NT, ND  Extremities: Warm to touch, +1 RLE edema and trace left ankle edema     01/10/2024,9:27 AM  LOS: 1 day

## 2024-01-10 NOTE — Plan of Care (Signed)

## 2024-01-10 NOTE — Progress Notes (Signed)
 Cardiologist:  Santo  Subjective:  Denies SSCP, palpitations or Dyspnea Long discussion about his anemia and renal failure   Objective:  Vitals:   01/10/24 0604 01/10/24 0605 01/10/24 0630 01/10/24 0830  BP: (!) 203/94 (!) 203/94 (!) 183/90 (!) 160/85  Pulse: 82  77 78  Resp: 18  18 13   Temp: 98 F (36.7 C)   98.1 F (36.7 C)  TempSrc: Oral   Oral  SpO2: 99%  98% 99%  Weight:      Height:        Intake/Output from previous day:  Intake/Output Summary (Last 24 hours) at 01/10/2024 0912 Last data filed at 01/10/2024 0835 Gross per 24 hour  Intake 243 ml  Output 1975 ml  Net -1732 ml    Physical Exam: Overweight black male Lungs clear No rub Abdomen benign Plus one edema  Lab Results: Basic Metabolic Panel: Recent Labs    01/08/24 1645 01/09/24 0633 01/10/24 0522  NA  --  142 143  K  --  3.9 3.6  CL  --  110 110  CO2  --  20* 21*  GLUCOSE  --  121* 107*  BUN  --  63* 67*  CREATININE  --  6.50* 6.30*  CALCIUM   --  9.2 8.8*  MG 2.7*  --  2.7*  PHOS  --   --  4.4   Liver Function Tests: Recent Labs    01/08/24 1208 01/09/24 0633  AST 17 15  ALT 28 23  ALKPHOS 86 81  BILITOT 1.3* 1.4*  PROT 7.0 6.6  ALBUMIN 3.5 3.3*   No results for input(s): LIPASE, AMYLASE in the last 72 hours. CBC: Recent Labs    01/08/24 1208 01/09/24 0633 01/10/24 0522  WBC 6.7 7.4 7.4  NEUTROABS 5.0  --   --   HGB 7.4* 9.2* 9.0*  HCT 24.4* 29.0* 28.2*  MCV 94.2 91.5 91.3  PLT 144* 155 156     Imaging: ECHOCARDIOGRAM COMPLETE Result Date: 01/09/2024    ECHOCARDIOGRAM REPORT   Patient Name:   Leiby Pigeon Date of Exam: 01/09/2024 Medical Rec #:  [id number]          Height:       76.0 in Accession #:    [id number]         Weight:       275.0 lb Date of Birth:  September 26, 1985          BSA:          2.536 m Patient Age:    38 years           BP:           160/99 mmHg Patient Gender: M                  HR:           81 bpm. Exam Location:  Inpatient  Procedure: 2D Echo, Cardiac Doppler, Color Doppler and Intracardiac            Opacification Agent (Both Spectral and Color Flow Doppler were            utilized during procedure). Indications:    CHF-Acute Systolic I50.21  History:        Patient has prior history of Echocardiogram examinations, most                 recent 04/01/2020. Cardiomegaly, Acute MI and CAD, CKD, stage 5,  Signs/Symptoms:Chest Pain; Risk Factors:Hypertension and                 Dyslipidemia.  Sonographer:    Thea Norlander RCS Referring Phys: MARSA NOVAK MELVIN IMPRESSIONS  1. Left ventricular ejection fraction, by estimation, is 30 to 35%. The left ventricle has moderately decreased function. The left ventricle demonstrates regional wall motion abnormalities (see scoring diagram/findings for description). The left ventricular internal cavity size was moderately dilated. There is moderate asymmetric left ventricular hypertrophy of the infero-lateral segment. Left ventricular diastolic parameters are indeterminate.  2. Right ventricular systolic function is normal. The right ventricular size is normal. Tricuspid regurgitation signal is inadequate for assessing PA pressure.  3. Left atrial size was mildly dilated.  4. A small pericardial effusion is present. There is no evidence of cardiac tamponade.  5. The mitral valve is grossly normal. Mild mitral valve regurgitation. No evidence of mitral stenosis.  6. The aortic valve is tricuspid. Aortic valve regurgitation is trivial. No aortic stenosis is present.  7. The inferior vena cava is dilated in size with <50% respiratory variability, suggesting right atrial pressure of 15 mmHg. FINDINGS  Left Ventricle: Left ventricular ejection fraction, by estimation, is 30 to 35%. The left ventricle has moderately decreased function. The left ventricle demonstrates regional wall motion abnormalities. Definity  contrast agent was given IV to delineate the left ventricular endocardial  borders. The left ventricular internal cavity size was moderately dilated. There is moderate asymmetric left ventricular hypertrophy of the infero-lateral segment. Left ventricular diastolic parameters are indeterminate.  LV Wall Scoring: The mid and distal anterior septum, entire inferior wall, and apical lateral segment are akinetic. The mid inferolateral segment, mid inferoseptal segment, apical anterior segment, and basal inferoseptal segment are hypokinetic. The mid anterolateral segment is normal. Right Ventricle: The right ventricular size is normal. No increase in right ventricular wall thickness. Right ventricular systolic function is normal. Tricuspid regurgitation signal is inadequate for assessing PA pressure. Left Atrium: Left atrial size was mildly dilated. Right Atrium: Right atrial size was normal in size. Pericardium: A small pericardial effusion is present. There is no evidence of cardiac tamponade. Mitral Valve: The mitral valve is grossly normal. Mild mitral valve regurgitation. No evidence of mitral valve stenosis. Tricuspid Valve: The tricuspid valve is normal in structure. Tricuspid valve regurgitation is trivial. No evidence of tricuspid stenosis. Aortic Valve: The aortic valve is tricuspid. Aortic valve regurgitation is trivial. No aortic stenosis is present. Aortic valve peak gradient measures 7.8 mmHg. Pulmonic Valve: The pulmonic valve was normal in structure. Pulmonic valve regurgitation is trivial. No evidence of pulmonic stenosis. Aorta: The aortic root is normal in size and structure. Venous: The inferior vena cava is dilated in size with less than 50% respiratory variability, suggesting right atrial pressure of 15 mmHg. IAS/Shunts: No atrial level shunt detected by color flow Doppler.  LEFT VENTRICLE PLAX 2D LVIDd:         6.40 cm   Diastology LVIDs:         4.80 cm   LV e' medial:    5.00 cm/s LV PW:         1.50 cm   LV E/e' medial:  22.2 LV IVS:        1.00 cm   LV e' lateral:    7.29 cm/s LVOT diam:     2.30 cm   LV E/e' lateral: 15.2 LV SV:         94 LV SV Index:   37 LVOT  Area:     4.15 cm  RIGHT VENTRICLE             IVC RV S prime:     16.90 cm/s  IVC diam: 2.40 cm TAPSE (M-mode): 2.4 cm LEFT ATRIUM             Index        RIGHT ATRIUM           Index LA diam:        4.70 cm 1.85 cm/m   RA Area:     18.60 cm LA Vol (A2C):   86.0 ml 33.91 ml/m  RA Volume:   51.50 ml  20.31 ml/m LA Vol (A4C):   73.3 ml 28.90 ml/m LA Biplane Vol: 84.3 ml 33.24 ml/m  AORTIC VALVE AV Area (Vmax): 3.03 cm AV Vmax:        140.00 cm/s AV Peak Grad:   7.8 mmHg LVOT Vmax:      102.00 cm/s LVOT Vmean:     69.600 cm/s LVOT VTI:       0.227 m  AORTA Ao Root diam: 3.40 cm Ao Asc diam:  3.40 cm MITRAL VALVE MV Area (PHT): 5.84 cm     SHUNTS MV Decel Time: 130 msec     Systemic VTI:  0.23 m MV E velocity: 111.00 cm/s  Systemic Diam: 2.30 cm MV A velocity: 99.10 cm/s MV E/A ratio:  1.12 Soyla Merck MD Electronically signed by Soyla Merck MD Signature Date/Time: 01/09/2024/1:47:23 PM    Final    DG Chest Port 1 View Result Date: 01/08/2024 CLINICAL DATA:  sob EXAM: PORTABLE CHEST - 1 VIEW COMPARISON:  03/25/2023 FINDINGS: Lungs are clear. Interval globular enlargement of the cardiac silhouette, cannot exclude pericardial effusion. Pulmonary vascularity normal. Mediastinal contour normal. No effusion. Visualized bones unremarkable. IMPRESSION: Globular enlargement of the cardiac silhouette, cannot exclude pericardial effusion. Electronically Signed   By: JONETTA Faes M.D.   On: 01/08/2024 13:59    Cardiac Studies:  ECG: SR rate 73 poor R wave progression   Telemetry: NSR   Echo: pending  Medications:    sodium chloride    Intravenous Once   aspirin  EC  81 mg Oral Daily   atorvastatin   80 mg Oral Daily   carvedilol   25 mg Oral BID WC   ezetimibe   10 mg Oral Daily   furosemide   80 mg Intravenous Q12H   heparin   5,000 Units Subcutaneous Q8H   hydrALAZINE   75 mg Oral Q8H   insulin  aspart   0-15 Units Subcutaneous TID WC   isosorbide  dinitrate  20 mg Oral TID   sodium chloride  flush  3 mL Intravenous Q12H      Assessment/Plan:   Cardiomegaly:  on CXR TTE with small effusion 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 EF about same 30-35% still with mild volume overload Output 1.7 L's. Continue iv lasix  today and transition to oral demadex  40 mg bid unless renal has other recs continue coreg , hydralazine  and imdur  CRF Follows with Dr Dalene CR consistently above 6. Renal does not think dialysis indicated now. Given his ischemic DCM with prior LAD infarct suspect he should have sooner rather than latter. Patient suggests he prefers peritoneal and possible transplant evaluation at [facility]. He will need heart cath to clear for transplant so will likely need dialysis before that.  Anemia:   transfusing a unit Hct improved 28.2  HTN:  on torsemide  , nitrates and coreg  can add hydralazine  as indicated by primary service  and then combine with nitrates with bidil   on d/c   Maude Emmer 01/10/2024, 9:12 AM

## 2024-01-10 NOTE — Progress Notes (Signed)
 PROGRESS NOTE    [name]  FMW:[id number] DOB: 1986/03/06 DOA: 01/08/2024 PCP: Marvene Prentice SAUNDERS, FNP  Chief Complaint  Patient presents with   Chest Pain    Brief Narrative:    [name] is [name] 38 y.o. male with medical history significant of hypertension, diabetes, hyperlipidemia, CAD status post stent, CKD 5, chronic combined systolic and diastolic CHF, obesity presenting with chest pain and shortness of breath.   Patient reports 3 to 4 days of feeling generally unwell with nausea, malaise, dyspnea on exertion.  Today he had episode of chest tightness in addition to his other symptoms.  Episode lasted around 20 minutes and has resolved but he continues to have his shortness of breath and nausea.   Patient did miss his most recent injection for his hemoglobin when he was out of town, presumably EPO.  Last dose was in June.  Likely discharge 7/30 after additional 24 hrs IV lasix .  Final recs per renal/cards.  Assessment & Plan:   Principal Problem:   Symptomatic anemia Active Problems:   Essential hypertension   Hyperlipidemia with target low density lipoprotein (LDL) cholesterol less than 55 mg/dL   Insulin -requiring or dependent type II diabetes mellitus (HCC)   CKD (chronic kidney disease) stage 5, GFR less than 15 ml/min (HCC)   HFrEF (heart failure with reduced ejection fraction) (HCC)   Cardiomegaly   Shortness of breath   Ischemic cardiomyopathy  Symptomatic Anemia Baseline hb appears to be around 8 7.4 on presentation, likely due to CKD  S/p 1 unit pRBC, hb 9 today Symptoms seem to have improved  Shortness of Breath  Orthopnea  Volume Overload  In setting of HFrEF and CKD V Pitting edema, orthopnea Continue IV lasix  per cards/renal  Symptoms are improving  HFrEF Pericardial Effusion Has some pitting edema on exam, describes orthopnea as well Echo with EF 30-35%, small pericardial effusion Appreciate cardiology assistance Diuresis per  renal/cardiology  CKD V Sees nephrology outpatient - creatinine appears to be at baseline, but reports orthopnea, also now with pericardial effusion Appreciate renal assistance  HTN Coreg , isordil , torsemide , hydralazine   CAD S/p stent 2021 Aspirin , statin   T2DM SSI   Obesity Body mass index is 34.11 kg/m.    DVT prophylaxis: heparin  Code Status: full Family Communication: wife at bedside Disposition:   Status is: Observation The patient remains OBS appropriate and will d/c before 2 midnights.   Consultants:  Cardiology Rena   Procedures:  none  Antimicrobials:  Anti-infectives (From admission, onward)    None       Subjective: Feeling better  Objective: Vitals:   01/10/24 0605 01/10/24 0630 01/10/24 0830 01/10/24 1145  BP: (!) 203/94 (!) 183/90 (!) 160/85 (!) 167/96  Pulse:  77 78 74  Resp:  18 13 10   Temp:   98.1 F (36.7 C) 98.3 F (36.8 C)  TempSrc:   Oral Oral  SpO2:  98% 99% 98%  Weight:      Height:        Intake/Output Summary (Last 24 hours) at 01/10/2024 1344 Last data filed at 01/10/2024 0835 Gross per 24 hour  Intake 243 ml  Output 1975 ml  Net -1732 ml   Filed Weights   01/08/24 1156 01/10/24 0500  Weight: 124.7 kg 127.1 kg    Examination:  General: No acute distress. Cardiovascular: Heart sounds show Richa Shor regular rate, and rhythm.  Lungs: Clear to auscultation bilaterally  Abdomen: Soft, nontender, nondistended Neurological: Alert and oriented 3. Moves  all extremities 4 with equal strength. Cranial nerves II through XII grossly intact. Extremities: bilateral 1+ LE edema   Data Reviewed: I have personally reviewed following labs and imaging studies  CBC: Recent Labs  Lab 01/08/24 1208 01/09/24 0633 01/10/24 0522  WBC 6.7 7.4 7.4  NEUTROABS 5.0  --   --   HGB 7.4* 9.2* 9.0*  HCT 24.4* 29.0* 28.2*  MCV 94.2 91.5 91.3  PLT 144* 155 156    Basic Metabolic Panel: Recent Labs  Lab 01/08/24 1208 01/08/24 1645  01/09/24 0633 01/10/24 0522  NA 143  --  142 143  K 4.1  --  3.9 3.6  CL 111  --  110 110  CO2 20*  --  20* 21*  GLUCOSE 134*  --  121* 107*  BUN 68*  --  63* 67*  CREATININE 6.61*  --  6.50* 6.30*  CALCIUM  9.0  --  9.2 8.8*  MG  --  2.7*  --  2.7*  PHOS  --   --   --  4.4    GFR: Estimated Creatinine Clearance: 22.7 mL/min (Jlyn Cerros) (by C-G formula based on SCr of 6.3 mg/dL (H)).  Liver Function Tests: Recent Labs  Lab 01/08/24 1208 01/09/24 0633  AST 17 15  ALT 28 23  ALKPHOS 86 81  BILITOT 1.3* 1.4*  PROT 7.0 6.6  ALBUMIN 3.5 3.3*    CBG: Recent Labs  Lab 01/09/24 1347 01/09/24 1728 01/09/24 2008 01/10/24 0931 01/10/24 1144  GLUCAP 129* 117* 143* 132* 115*     Recent Results (from the past 240 hours)  Resp panel by RT-PCR (RSV, Flu Solomiya Pascale&B, Covid) Anterior Nasal Swab     Status: None   Collection Time: 01/08/24  1:34 PM   Specimen: Anterior Nasal Swab  Result Value Ref Range Status   SARS Coronavirus 2 by RT PCR NEGATIVE NEGATIVE Final    Comment: (NOTE) SARS-CoV-2 target nucleic acids are NOT DETECTED.  The SARS-CoV-2 RNA is generally detectable in upper respiratory specimens during the acute phase of infection. The lowest concentration of SARS-CoV-2 viral copies this assay can detect is 138 copies/mL. Dao Mearns negative result does not preclude SARS-Cov-2 infection and should not be used as the sole basis for treatment or other patient management decisions. Teryn Boerema negative result may occur with  improper specimen collection/handling, submission of specimen other than nasopharyngeal swab, presence of viral mutation(s) within the areas targeted by this assay, and inadequate number of viral copies(<138 copies/mL). Jaylaa Gallion negative result must be combined with clinical observations, patient history, and epidemiological information. The expected result is Negative.  Fact Sheet for Patients:  BloggerCourse.com  Fact Sheet for Healthcare Providers:   SeriousBroker.it  This test is no t yet approved or cleared by the [place]  FDA and  has been authorized for detection and/or diagnosis of SARS-CoV-2 by FDA under an Emergency Use Authorization (EUA). This EUA will remain  in effect (meaning this test can be used) for the duration of the COVID-19 declaration under Section 564(b)(1) of the Act, 21 U.S.C.section 360bbb-3(b)(1), unless the authorization is terminated  or revoked sooner.       Influenza Gavyn Ybarra by PCR NEGATIVE NEGATIVE Final   Influenza B by PCR NEGATIVE NEGATIVE Final    Comment: (NOTE) The Xpert Xpress SARS-CoV-2/FLU/RSV plus assay is intended as an aid in the diagnosis of influenza from Nasopharyngeal swab specimens and should not be used as Saidi Santacroce sole basis for treatment. Nasal washings and aspirates are unacceptable for Xpert Xpress SARS-CoV-2/FLU/RSV  testing.  Fact Sheet for Patients: BloggerCourse.com  Fact Sheet for Healthcare Providers: SeriousBroker.it  This test is not yet approved or cleared by the [place]  FDA and has been authorized for detection and/or diagnosis of SARS-CoV-2 by FDA under an Emergency Use Authorization (EUA). This EUA will remain in effect (meaning this test can be used) for the duration of the COVID-19 declaration under Section 564(b)(1) of the Act, 21 U.S.C. section 360bbb-3(b)(1), unless the authorization is terminated or revoked.     Resp Syncytial Virus by PCR NEGATIVE NEGATIVE Final    Comment: (NOTE) Fact Sheet for Patients: BloggerCourse.com  Fact Sheet for Healthcare Providers: SeriousBroker.it  This test is not yet approved or cleared by the [place]  FDA and has been authorized for detection and/or diagnosis of SARS-CoV-2 by FDA under an Emergency Use Authorization (EUA). This EUA will remain in effect (meaning this test can be used) for  the duration of the COVID-19 declaration under Section 564(b)(1) of the Act, 21 U.S.C. section 360bbb-3(b)(1), unless the authorization is terminated or revoked.  Performed at [facility], [address] [place], [name]   MRSA Next Gen by PCR, Nasal     Status: None   Collection Time: 01/09/24  3:59 AM   Specimen: Nasal Mucosa; Nasal Swab  Result Value Ref Range Status   MRSA by PCR Next Gen NOT DETECTED NOT DETECTED Final    Comment: (NOTE) The GeneXpert MRSA Assay (FDA approved for NASAL specimens only), is one component of Barth Trella comprehensive MRSA colonization surveillance program. It is not intended to diagnose MRSA infection nor to guide or monitor treatment for MRSA infections. Test performance is not FDA approved in patients less than 56 years old. Performed at [facility], [address], [place], [place] [address]          Radiology Studies: ECHOCARDIOGRAM COMPLETE Result Date: 01/09/2024    ECHOCARDIOGRAM REPORT   Patient Name:   [name] Date of Exam: 01/09/2024 Medical Rec #:  [id number]          Height:       76.0 in Accession #:    [id number]         Weight:       275.0 lb Date of Birth:  16-Feb-1986          BSA:          2.536 m Patient Age:    38 years           BP:           160/99 mmHg Patient Gender: M                  HR:           81 bpm. Exam Location:  Inpatient Procedure: 2D Echo, Cardiac Doppler, Color Doppler and Intracardiac            Opacification Agent (Both Spectral and Color Flow Doppler were            utilized during procedure). Indications:    CHF-Acute Systolic I50.21  History:        Patient has prior history of Echocardiogram examinations, most                 recent 04/01/2020. Cardiomegaly, Acute MI and CAD, CKD, stage 5,                 Signs/Symptoms:Chest Pain; Risk Factors:Hypertension and  Dyslipidemia.  Sonographer:    Thea Norlander RCS Referring Phys: MARSA NOVAK MELVIN  IMPRESSIONS  1. Left ventricular ejection fraction, by estimation, is 30 to 35%. The left ventricle has moderately decreased function. The left ventricle demonstrates regional wall motion abnormalities (see scoring diagram/findings for description). The left ventricular internal cavity size was moderately dilated. There is moderate asymmetric left ventricular hypertrophy of the infero-lateral segment. Left ventricular diastolic parameters are indeterminate.  2. Right ventricular systolic function is normal. The right ventricular size is normal. Tricuspid regurgitation signal is inadequate for assessing PA pressure.  3. Left atrial size was mildly dilated.  4. Rekisha Welling small pericardial effusion is present. There is no evidence of cardiac tamponade.  5. The mitral valve is grossly normal. Mild mitral valve regurgitation. No evidence of mitral stenosis.  6. The aortic valve is tricuspid. Aortic valve regurgitation is trivial. No aortic stenosis is present.  7. The inferior vena cava is dilated in size with <50% respiratory variability, suggesting right atrial pressure of 15 mmHg. FINDINGS  Left Ventricle: Left ventricular ejection fraction, by estimation, is 30 to 35%. The left ventricle has moderately decreased function. The left ventricle demonstrates regional wall motion abnormalities. Definity  contrast agent was given IV to delineate the left ventricular endocardial borders. The left ventricular internal cavity size was moderately dilated. There is moderate asymmetric left ventricular hypertrophy of the infero-lateral segment. Left ventricular diastolic parameters are indeterminate.  LV Wall Scoring: The mid and distal anterior septum, entire inferior wall, and apical lateral segment are akinetic. The mid inferolateral segment, mid inferoseptal segment, apical anterior segment, and basal inferoseptal segment are hypokinetic. The mid anterolateral segment is normal. Right Ventricle: The right ventricular size is normal.  No increase in right ventricular wall thickness. Right ventricular systolic function is normal. Tricuspid regurgitation signal is inadequate for assessing PA pressure. Left Atrium: Left atrial size was mildly dilated. Right Atrium: Right atrial size was normal in size. Pericardium: Sheridan Gettel small pericardial effusion is present. There is no evidence of cardiac tamponade. Mitral Valve: The mitral valve is grossly normal. Mild mitral valve regurgitation. No evidence of mitral valve stenosis. Tricuspid Valve: The tricuspid valve is normal in structure. Tricuspid valve regurgitation is trivial. No evidence of tricuspid stenosis. Aortic Valve: The aortic valve is tricuspid. Aortic valve regurgitation is trivial. No aortic stenosis is present. Aortic valve peak gradient measures 7.8 mmHg. Pulmonic Valve: The pulmonic valve was normal in structure. Pulmonic valve regurgitation is trivial. No evidence of pulmonic stenosis. Aorta: The aortic root is normal in size and structure. Venous: The inferior vena cava is dilated in size with less than 50% respiratory variability, suggesting right atrial pressure of 15 mmHg. IAS/Shunts: No atrial level shunt detected by color flow Doppler.  LEFT VENTRICLE PLAX 2D LVIDd:         6.40 cm   Diastology LVIDs:         4.80 cm   LV e' medial:    5.00 cm/s LV PW:         1.50 cm   LV E/e' medial:  22.2 LV IVS:        1.00 cm   LV e' lateral:   7.29 cm/s LVOT diam:     2.30 cm   LV E/e' lateral: 15.2 LV SV:         94 LV SV Index:   37 LVOT Area:     4.15 cm  RIGHT VENTRICLE  IVC RV S prime:     16.90 cm/s  IVC diam: 2.40 cm TAPSE (M-mode): 2.4 cm LEFT ATRIUM             Index        RIGHT ATRIUM           Index LA diam:        4.70 cm 1.85 cm/m   RA Area:     18.60 cm LA Vol (A2C):   86.0 ml 33.91 ml/m  RA Volume:   51.50 ml  20.31 ml/m LA Vol (A4C):   73.3 ml 28.90 ml/m LA Biplane Vol: 84.3 ml 33.24 ml/m  AORTIC VALVE AV Area (Vmax): 3.03 cm AV Vmax:        140.00 cm/s AV Peak  Grad:   7.8 mmHg LVOT Vmax:      102.00 cm/s LVOT Vmean:     69.600 cm/s LVOT VTI:       0.227 m  AORTA Ao Root diam: 3.40 cm Ao Asc diam:  3.40 cm MITRAL VALVE MV Area (PHT): 5.84 cm     SHUNTS MV Decel Time: 130 msec     Systemic VTI:  0.23 m MV E velocity: 111.00 cm/s  Systemic Diam: 2.30 cm MV Zamauri Nez velocity: 99.10 cm/s MV E/Priest Lockridge ratio:  1.12 Soyla Merck MD Electronically signed by Soyla Merck MD Signature Date/Time: 01/09/2024/1:47:23 PM    Final         Scheduled Meds:  sodium chloride    Intravenous Once   aspirin  EC  81 mg Oral Daily   atorvastatin   80 mg Oral Daily   carvedilol   25 mg Oral BID WC   darbepoetin (ARANESP ) injection - DIALYSIS  100 mcg Subcutaneous Once   ezetimibe   10 mg Oral Daily   furosemide   80 mg Intravenous Q12H   heparin   5,000 Units Subcutaneous Q8H   hydrALAZINE   75 mg Oral Q8H   insulin  aspart  0-15 Units Subcutaneous TID WC   isosorbide  dinitrate  20 mg Oral TID   sodium chloride  flush  3 mL Intravenous Q12H   Continuous Infusions:  iron  sucrose       LOS: 1 day    Time spent: over 30 min    Meliton Monte, MD [facility]   To contact the attending provider between 7A-7P or the covering provider during after hours 7P-7A, please log into the web site [email or web address] and access using universal [place] password for that web site. If you do not have the password, please call the hospital operator.  01/10/2024, 1:44 PM

## 2024-01-10 NOTE — TOC Initial Note (Signed)
 Transition of Care [facility]) - Initial/Assessment Note    Patient Details  Name: [name] MRN: [id number] Date of Birth: 09/02/1985  Transition of Care [facility]) CM/SW Contact:    Marval Gell, RN Phone Number: 01/10/2024, 12:12 PM  Clinical Narrative:                  Per chart review Patient from home w wife.  Patient admitted from home for symptomatic anemia and CKD 5.  Today symptoms improving, RA. Nephrology and cardiology following.  Patient has primary care and insurance coverage.  ICM will follow for DC needs, non identified at this time   Expected Discharge Plan: Home/Self Care Barriers to Discharge: Continued Medical Work up   Patient Goals and CMS Choice Patient states their goals for this hospitalization and ongoing recovery are:: to go home   Choice offered to / list presented to : NA      Expected Discharge Plan and Services   Discharge Planning Services: CM Consult   Living arrangements for the past 2 months: Apartment                                      Prior Living Arrangements/Services Living arrangements for the past 2 months: Apartment Lives with:: Spouse                   Activities of Daily Living   ADL Screening (condition at time of admission) Independently performs ADLs?: Yes (appropriate for developmental age) Is the patient deaf or have difficulty hearing?: No Does the patient have difficulty seeing, even when wearing glasses/contacts?: No Does the patient have difficulty concentrating, remembering, or making decisions?: No  Permission Sought/Granted                  Emotional Assessment              Admission diagnosis:  Hypertensive urgency [I16.0] Nonspecific chest pain [R07.9] Symptomatic anemia [D64.9] Shortness of breath [R06.02] Patient Active Problem List   Diagnosis Date Noted   Ischemic cardiomyopathy 01/10/2024   Cardiomegaly 01/09/2024   Shortness of breath 01/09/2024   Symptomatic  anemia 01/08/2024   Chest pain of uncertain etiology    HFrEF (heart failure with reduced ejection fraction) (HCC) 12/27/2021   Hypertensive urgency 08/28/2021   Coronary artery disease involving native coronary artery of native heart with angina pectoris (HCC) 08/28/2021   CKD (chronic kidney disease) stage 5, GFR less than 15 ml/min (HCC) 08/28/2021   Chest discomfort 08/28/2021   Acute anterior wall MI (HCC) 01/28/2020   Gastroenteritis 07/20/2018   Chest pain 07/19/2018   Hyperlipidemia with target low density lipoprotein (LDL) cholesterol less than 55 mg/dL 91/97/7983   Obesity (BMI 30-39.9) 01/14/2015   Insulin -requiring or dependent type II diabetes mellitus (HCC) 01/14/2015   Essential hypertension 08/31/2014   PCP:  Marvene Prentice SAUNDERS, FNP Pharmacy:   [id number] - [place], [place] - [address] AT [facility] [address] [place] [address] Phone: [phone] Fax: [phone]  [facility] [address] [place] [address] Phone: [phone] Fax: [phone]     Social Drivers of Health (SDOH) Social History: SDOH Screenings   Food Insecurity: No Food Insecurity (01/09/2024)  Housing: Low Risk  (01/09/2024)  Transportation Needs: No Transportation Needs (01/09/2024)  Utilities: Not At Risk (01/09/2024)  Tobacco Use: Low  Risk  (01/09/2024)   SDOH Interventions:     Readmission Risk Interventions     No data to display

## 2024-01-11 ENCOUNTER — Other Ambulatory Visit (HOSPITAL_COMMUNITY): Payer: Self-pay

## 2024-01-11 ENCOUNTER — Encounter (HOSPITAL_COMMUNITY): Payer: Self-pay | Admitting: Internal Medicine

## 2024-01-11 DIAGNOSIS — D649 Anemia, unspecified: Secondary | ICD-10-CM | POA: Diagnosis not present

## 2024-01-11 DIAGNOSIS — R079 Chest pain, unspecified: Secondary | ICD-10-CM | POA: Diagnosis not present

## 2024-01-11 DIAGNOSIS — N185 Chronic kidney disease, stage 5: Secondary | ICD-10-CM | POA: Diagnosis not present

## 2024-01-11 DIAGNOSIS — E119 Type 2 diabetes mellitus without complications: Secondary | ICD-10-CM | POA: Diagnosis not present

## 2024-01-11 LAB — CBC WITH DIFFERENTIAL/PLATELET
Abs Immature Granulocytes: 0.02 K/uL (ref 0.00–0.07)
Basophils Absolute: 0.1 K/uL (ref 0.0–0.1)
Basophils Relative: 1 %
Eosinophils Absolute: 0.3 K/uL (ref 0.0–0.5)
Eosinophils Relative: 4 %
HCT: 27.9 % — ABNORMAL LOW (ref 39.0–52.0)
Hemoglobin: 9 g/dL — ABNORMAL LOW (ref 13.0–17.0)
Immature Granulocytes: 0 %
Lymphocytes Relative: 19 %
Lymphs Abs: 1.6 K/uL (ref 0.7–4.0)
MCH: 29.5 pg (ref 26.0–34.0)
MCHC: 32.3 g/dL (ref 30.0–36.0)
MCV: 91.5 fL (ref 80.0–100.0)
Monocytes Absolute: 0.6 K/uL (ref 0.1–1.0)
Monocytes Relative: 7 %
Neutro Abs: 5.8 K/uL (ref 1.7–7.7)
Neutrophils Relative %: 69 %
Platelets: 150 K/uL (ref 150–400)
RBC: 3.05 MIL/uL — ABNORMAL LOW (ref 4.22–5.81)
RDW: 14 % (ref 11.5–15.5)
WBC: 8.4 K/uL (ref 4.0–10.5)
nRBC: 0 % (ref 0.0–0.2)

## 2024-01-11 LAB — COMPREHENSIVE METABOLIC PANEL WITH GFR
ALT: 19 U/L (ref 0–44)
AST: 17 U/L (ref 15–41)
Albumin: 3.4 g/dL — ABNORMAL LOW (ref 3.5–5.0)
Alkaline Phosphatase: 77 U/L (ref 38–126)
Anion gap: 12 (ref 5–15)
BUN: 74 mg/dL — ABNORMAL HIGH (ref 6–20)
CO2: 19 mmol/L — ABNORMAL LOW (ref 22–32)
Calcium: 8.4 mg/dL — ABNORMAL LOW (ref 8.9–10.3)
Chloride: 109 mmol/L (ref 98–111)
Creatinine, Ser: 6.64 mg/dL — ABNORMAL HIGH (ref 0.61–1.24)
GFR, Estimated: 10 mL/min — ABNORMAL LOW (ref >60)
Glucose, Bld: 119 mg/dL — ABNORMAL HIGH (ref 70–99)
Potassium: 3.7 mmol/L (ref 3.5–5.1)
Sodium: 140 mmol/L (ref 135–145)
Total Bilirubin: 0.7 mg/dL (ref 0.0–1.2)
Total Protein: 6.2 g/dL — ABNORMAL LOW (ref 6.5–8.1)

## 2024-01-11 LAB — PHOSPHORUS: Phosphorus: 4.8 mg/dL — ABNORMAL HIGH (ref 2.5–4.6)

## 2024-01-11 LAB — GLUCOSE, CAPILLARY: Glucose-Capillary: 103 mg/dL — ABNORMAL HIGH (ref 70–99)

## 2024-01-11 LAB — MAGNESIUM: Magnesium: 2.4 mg/dL (ref 1.7–2.4)

## 2024-01-11 MED ORDER — TORSEMIDE 20 MG PO TABS
40.0000 mg | ORAL_TABLET | Freq: Two times a day (BID) | ORAL | 2 refills | Status: AC
  Filled 2024-01-11: qty 120, 30d supply, fill #0

## 2024-01-11 NOTE — Discharge Summary (Signed)
 PATIENT DETAILS Name: Sandford Diop Age: 38 y.o. Sex: male Date of Birth: 02-Jan-1986 MRN: [id number]. Admitting Physician: A Meliton Perri Raddle., MD ERE:Amjxz, Prentice SAUNDERS, FNP  Admit Date: 01/08/2024 Discharge date: 01/11/2024  Recommendations for Outpatient Follow-up:  Follow up with PCP in 1-2 weeks Please obtain CMP/CBC in one week Please ensure outpatient follow-up with nephrology/cardiology.   Admitted From:  Home  Disposition: Home   Discharge Condition: good  CODE STATUS:   Code Status: Full Code   Diet recommendation:  Diet Order             Diet - low sodium heart healthy           Diet Carb Modified           Diet heart healthy/carb modified Room service appropriate? Yes; Fluid consistency: Thin; Fluid restriction: 1800 mL Fluid  Diet effective now                    Brief Summary: 38 year old with history of HTN, DM-2, HLD, CKD 5, chronic HFrEF-presented with shortness of breath-found to have HFrEF exacerbation and subsequently admitted to the hospitalist service  Significant studies 7/28>> TTE: EF 30-35%, small pericardial effusion without tamponade.  Brief Hospital Course: Chronic HFrEF Volume status much improved with IV diuretics Followed closely by both nephrology/cardiology Recommendations are to transition to Demadex  40 mg twice daily on discharge.  Small pericardial effusion without tamponade on echo Continue outpatient monitoring-repeat echo in 4 to 6 weeks  Chronic normocytic anemia Due to underlying CKD S/p 1 unit of PRBC-Hb stable No evidence of blood loss Follow CBC periodically as an outpatient. Continue Aranesp  infusion as an outpatient per nephrology discretion.  CKD 5 Appears to have advanced CKD at baseline and nearing dialysis but no current acute indications for HD. Nephrology followed closely-with plans for continued outpatient follow-up.  HTN BP stable Continue Coreg /Isordil  Torsemide /hydralazine   CAD s/p  PCI 2021 Aspirin /statin  Class 1 Obesity: Estimated body mass index is 34.11 kg/m as calculated from the following:   Height as of this encounter: 6' 4 (1.93 m).   Weight as of this encounter: 127.1 kg.    Discharge Diagnoses:  Principal Problem:   Symptomatic anemia Active Problems:   Essential hypertension   Hyperlipidemia with target low density lipoprotein (LDL) cholesterol less than 55 mg/dL   Insulin -requiring or dependent type II diabetes mellitus (HCC)   CKD (chronic kidney disease) stage 5, GFR less than 15 ml/min (HCC)   HFrEF (heart failure with reduced ejection fraction) (HCC)   Cardiomegaly   Shortness of breath   Ischemic cardiomyopathy   Discharge Instructions:  Activity:  As tolerated   Discharge Instructions     (HEART FAILURE PATIENTS) Call MD:  Anytime you have any of the following symptoms: 1) 3 pound weight gain in 24 hours or 5 pounds in 1 week 2) shortness of breath, with or without a dry hacking cough 3) swelling in the hands, feet or stomach 4) if you have to sleep on extra pillows at night in order to breathe.   Complete by: As directed    Diet - low sodium heart healthy   Complete by: As directed    Diet Carb Modified   Complete by: As directed    Discharge instructions   Complete by: As directed    Follow with Primary MD  Marvene Prentice SAUNDERS, FNP in 1-2 weeks  Please get a complete blood count and chemistry panel checked by your Primary  MD at your next visit, and again as instructed by your Primary MD.  Get Medicines reviewed and adjusted: Please take all your medications with you for your next visit with your Primary MD  Laboratory/radiological data: Please request your Primary MD to go over all hospital tests and procedure/radiological results at the follow up, please ask your Primary MD to get all Hospital records sent to his/her office.  In some cases, they will be blood work, cultures and biopsy results pending at the time of your  discharge. Please request that your primary care M.D. follows up on these results.  Also Note the following: If you experience worsening of your admission symptoms, develop shortness of breath, life threatening emergency, suicidal or homicidal thoughts you must seek medical attention immediately by calling 911 or calling your MD immediately  if symptoms less severe.  You must read complete instructions/literature along with all the possible adverse reactions/side effects for all the Medicines you take and that have been prescribed to you. Take any new Medicines after you have completely understood and accpet all the possible adverse reactions/side effects.   Do not drive when taking Pain medications or sleeping medications (Benzodaizepines)  Do not take more than prescribed Pain, Sleep and Anxiety Medications. It is not advisable to combine anxiety,sleep and pain medications without talking with your primary care practitioner  Special Instructions: If you have smoked or chewed Tobacco  in the last 2 yrs please stop smoking, stop any regular Alcohol  and or any Recreational drug use.  Wear Seat belts while driving.  Please note: You were cared for by a hospitalist during your hospital stay. Once you are discharged, your primary care physician will handle any further medical issues. Please note that NO REFILLS for any discharge medications will be authorized once you are discharged, as it is imperative that you return to your primary care physician (or establish a relationship with a primary care physician if you do not have one) for your post hospital discharge needs so that they can reassess your need for medications and monitor your lab values.   Discharge wound care:   Complete by: As directed    Cleanse L  3rd digit and L great toe wounds with Betadine, apply Betadine moistened gauze to wound beds daily, cover with dry gauze and secure with tape   Increase activity slowly   Complete by: As  directed       Allergies as of 01/11/2024   No Known Allergies      Medication List     STOP taking these medications    doxycycline  100 MG tablet Commonly known as: VIBRA -TABS   Jardiance  10 MG Tabs tablet Generic drug: empagliflozin        TAKE these medications    aspirin  EC 81 MG tablet Take 81 mg by mouth daily. Swallow whole.   atorvastatin  80 MG tablet Commonly known as: LIPITOR  TAKE 1 TABLET BY MOUTH EVERY DAY   carvedilol  25 MG tablet Commonly known as: COREG  TAKE 1 TABLET BY MOUTH TWICE A DAY IN THE MORNING AND IN THE EVENING   ezetimibe  10 MG tablet Commonly known as: ZETIA  Take 1 tablet (10 mg total) by mouth daily.   hydrALAZINE  50 MG tablet Commonly known as: APRESOLINE  TAKE 1 & 1/2 TABLETS BY MOUTH 3 TIMES DAILY   isosorbide  dinitrate 20 MG tablet Commonly known as: ISORDIL  TAKE 1 TABLET BY MOUTH THREE TIMES A DAY   nitroGLYCERIN  0.4 MG SL tablet Commonly known as: NITROSTAT   Place 1 tablet (0.4 mg total) under the tongue every 5 (five) minutes as needed for chest pain.   Torsemide  40 MG Tabs Take 40 mg by mouth 2 (two) times daily. What changed:  medication strength how much to take when to take this               Discharge Care Instructions  (From admission, onward)           Start     Ordered   01/11/24 0000  Discharge wound care:       Comments: Cleanse L  3rd digit and L great toe wounds with Betadine, apply Betadine moistened gauze to wound beds daily, cover with dry gauze and secure with tape   01/11/24 0802            Follow-up Information     Marvene Prentice SAUNDERS, FNP. Schedule an appointment as soon as possible for a visit in 1 week(s).   Specialty: Family Medicine Contact information: [address] [place] [address] [phone]         Macel Jayson PARAS, MD. Schedule an appointment as soon as possible for a visit in 1 week(s).   Specialty: Internal Medicine Contact information: [address] [place] [address] [phone]                No Known Allergies   Other Procedures/Studies: ECHOCARDIOGRAM COMPLETE Result Date: 01/09/2024    ECHOCARDIOGRAM REPORT   Patient Name:   Uriel Dowding Date of Exam: 01/09/2024 Medical Rec #:  [id number]          Height:       76.0 in Accession #:    [id number]         Weight:       275.0 lb Date of Birth:  March 03, 1986          BSA:          2.536 m Patient Age:    38 years           BP:           160/99 mmHg Patient Gender: M                  HR:           81 bpm. Exam Location:  Inpatient Procedure: 2D Echo, Cardiac Doppler, Color Doppler and Intracardiac            Opacification Agent (Both Spectral and Color Flow Doppler were            utilized during procedure). Indications:    CHF-Acute Systolic I50.21  History:        Patient has prior history of Echocardiogram examinations, most                 recent 04/01/2020. Cardiomegaly, Acute MI and CAD, CKD, stage 5,                 Signs/Symptoms:Chest Pain; Risk Factors:Hypertension and                 Dyslipidemia.  Sonographer:    Thea Norlander RCS Referring Phys: MARSA NOVAK MELVIN IMPRESSIONS  1. Left ventricular ejection fraction, by estimation, is 30 to 35%. The left ventricle has moderately decreased function. The left ventricle demonstrates regional wall motion abnormalities (see scoring diagram/findings for description). The left ventricular internal cavity size was moderately dilated. There is moderate asymmetric left ventricular hypertrophy of the infero-lateral segment.  Left ventricular diastolic parameters are indeterminate.  2. Right ventricular systolic function is normal. The right ventricular size is normal. Tricuspid regurgitation signal is inadequate for assessing PA pressure.  3. Left atrial size was mildly dilated.  4. A small pericardial effusion is present. There is no evidence of cardiac tamponade.  5. The mitral valve is grossly normal. Mild mitral valve  regurgitation. No evidence of mitral stenosis.  6. The aortic valve is tricuspid. Aortic valve regurgitation is trivial. No aortic stenosis is present.  7. The inferior vena cava is dilated in size with <50% respiratory variability, suggesting right atrial pressure of 15 mmHg. FINDINGS  Left Ventricle: Left ventricular ejection fraction, by estimation, is 30 to 35%. The left ventricle has moderately decreased function. The left ventricle demonstrates regional wall motion abnormalities. Definity  contrast agent was given IV to delineate the left ventricular endocardial borders. The left ventricular internal cavity size was moderately dilated. There is moderate asymmetric left ventricular hypertrophy of the infero-lateral segment. Left ventricular diastolic parameters are indeterminate.  LV Wall Scoring: The mid and distal anterior septum, entire inferior wall, and apical lateral segment are akinetic. The mid inferolateral segment, mid inferoseptal segment, apical anterior segment, and basal inferoseptal segment are hypokinetic. The mid anterolateral segment is normal. Right Ventricle: The right ventricular size is normal. No increase in right ventricular wall thickness. Right ventricular systolic function is normal. Tricuspid regurgitation signal is inadequate for assessing PA pressure. Left Atrium: Left atrial size was mildly dilated. Right Atrium: Right atrial size was normal in size. Pericardium: A small pericardial effusion is present. There is no evidence of cardiac tamponade. Mitral Valve: The mitral valve is grossly normal. Mild mitral valve regurgitation. No evidence of mitral valve stenosis. Tricuspid Valve: The tricuspid valve is normal in structure. Tricuspid valve regurgitation is trivial. No evidence of tricuspid stenosis. Aortic Valve: The aortic valve is tricuspid. Aortic valve regurgitation is trivial. No aortic stenosis is present. Aortic valve peak gradient measures 7.8 mmHg. Pulmonic Valve: The  pulmonic valve was normal in structure. Pulmonic valve regurgitation is trivial. No evidence of pulmonic stenosis. Aorta: The aortic root is normal in size and structure. Venous: The inferior vena cava is dilated in size with less than 50% respiratory variability, suggesting right atrial pressure of 15 mmHg. IAS/Shunts: No atrial level shunt detected by color flow Doppler.  LEFT VENTRICLE PLAX 2D LVIDd:         6.40 cm   Diastology LVIDs:         4.80 cm   LV e' medial:    5.00 cm/s LV PW:         1.50 cm   LV E/e' medial:  22.2 LV IVS:        1.00 cm   LV e' lateral:   7.29 cm/s LVOT diam:     2.30 cm   LV E/e' lateral: 15.2 LV SV:         94 LV SV Index:   37 LVOT Area:     4.15 cm  RIGHT VENTRICLE             IVC RV S prime:     16.90 cm/s  IVC diam: 2.40 cm TAPSE (M-mode): 2.4 cm LEFT ATRIUM             Index        RIGHT ATRIUM           Index LA diam:        4.70 cm 1.85  cm/m   RA Area:     18.60 cm LA Vol (A2C):   86.0 ml 33.91 ml/m  RA Volume:   51.50 ml  20.31 ml/m LA Vol (A4C):   73.3 ml 28.90 ml/m LA Biplane Vol: 84.3 ml 33.24 ml/m  AORTIC VALVE AV Area (Vmax): 3.03 cm AV Vmax:        140.00 cm/s AV Peak Grad:   7.8 mmHg LVOT Vmax:      102.00 cm/s LVOT Vmean:     69.600 cm/s LVOT VTI:       0.227 m  AORTA Ao Root diam: 3.40 cm Ao Asc diam:  3.40 cm MITRAL VALVE MV Area (PHT): 5.84 cm     SHUNTS MV Decel Time: 130 msec     Systemic VTI:  0.23 m MV E velocity: 111.00 cm/s  Systemic Diam: 2.30 cm MV A velocity: 99.10 cm/s MV E/A ratio:  1.12 Soyla Merck MD Electronically signed by Soyla Merck MD Signature Date/Time: 01/09/2024/1:47:23 PM    Final    DG Chest Port 1 View Result Date: 01/08/2024 CLINICAL DATA:  sob EXAM: PORTABLE CHEST - 1 VIEW COMPARISON:  03/25/2023 FINDINGS: Lungs are clear. Interval globular enlargement of the cardiac silhouette, cannot exclude pericardial effusion. Pulmonary vascularity normal. Mediastinal contour normal. No effusion. Visualized bones unremarkable.  IMPRESSION: Globular enlargement of the cardiac silhouette, cannot exclude pericardial effusion. Electronically Signed   By: JONETTA Faes M.D.   On: 01/08/2024 13:59     TODAY-DAY OF DISCHARGE:  Subjective:   Mikel Hardgrove today has no headache,no chest abdominal pain,no new weakness tingling or numbness, feels much better wants to go home today.   Objective:   Blood pressure 133/71, pulse 76, temperature 98.5 F (36.9 C), temperature source Oral, resp. rate 16, height 6' 4 (1.93 m), weight 127.1 kg, SpO2 93%.  Intake/Output Summary (Last 24 hours) at 01/11/2024 0803 Last data filed at 01/10/2024 2248 Gross per 24 hour  Intake 243 ml  Output 725 ml  Net -482 ml   Filed Weights   01/08/24 1156 01/10/24 0500  Weight: 124.7 kg 127.1 kg    Exam: Awake Alert, Oriented *3, No new F.N deficits, Normal affect Maili.AT,PERRAL Supple Neck,No JVD, No cervical lymphadenopathy appriciated.  Symmetrical Chest wall movement, Good air movement bilaterally, CTAB RRR,No Gallops,Rubs or new Murmurs, No Parasternal Heave +ve B.Sounds, Abd Soft, Non tender, No organomegaly appriciated, No rebound -guarding or rigidity. No Cyanosis, Clubbing or edema, No new Rash or bruise   PERTINENT RADIOLOGIC STUDIES: ECHOCARDIOGRAM COMPLETE Result Date: 01/09/2024    ECHOCARDIOGRAM REPORT   Patient Name:   Tomoya Ringwald Date of Exam: 01/09/2024 Medical Rec #:  [id number]          Height:       76.0 in Accession #:    [id number]         Weight:       275.0 lb Date of Birth:  [birth year]          BSA:          2.536 m Patient Age:    38 years           BP:           160/99 mmHg Patient Gender: M                  HR:           81 bpm. Exam Location:  Inpatient Procedure: 2D Echo, Cardiac Doppler, Color Doppler  and Intracardiac            Opacification Agent (Both Spectral and Color Flow Doppler were            utilized during procedure). Indications:    CHF-Acute Systolic I50.21  History:        Patient has prior history  of Echocardiogram examinations, most                 recent 04/01/2020. Cardiomegaly, Acute MI and CAD, CKD, stage 5,                 Signs/Symptoms:Chest Pain; Risk Factors:Hypertension and                 Dyslipidemia.  Sonographer:    Thea Norlander RCS Referring Phys: MARSA NOVAK MELVIN IMPRESSIONS  1. Left ventricular ejection fraction, by estimation, is 30 to 35%. The left ventricle has moderately decreased function. The left ventricle demonstrates regional wall motion abnormalities (see scoring diagram/findings for description). The left ventricular internal cavity size was moderately dilated. There is moderate asymmetric left ventricular hypertrophy of the infero-lateral segment. Left ventricular diastolic parameters are indeterminate.  2. Right ventricular systolic function is normal. The right ventricular size is normal. Tricuspid regurgitation signal is inadequate for assessing PA pressure.  3. Left atrial size was mildly dilated.  4. A small pericardial effusion is present. There is no evidence of cardiac tamponade.  5. The mitral valve is grossly normal. Mild mitral valve regurgitation. No evidence of mitral stenosis.  6. The aortic valve is tricuspid. Aortic valve regurgitation is trivial. No aortic stenosis is present.  7. The inferior vena cava is dilated in size with <50% respiratory variability, suggesting right atrial pressure of 15 mmHg. FINDINGS  Left Ventricle: Left ventricular ejection fraction, by estimation, is 30 to 35%. The left ventricle has moderately decreased function. The left ventricle demonstrates regional wall motion abnormalities. Definity  contrast agent was given IV to delineate the left ventricular endocardial borders. The left ventricular internal cavity size was moderately dilated. There is moderate asymmetric left ventricular hypertrophy of the infero-lateral segment. Left ventricular diastolic parameters are indeterminate.  LV Wall Scoring: The mid and distal anterior  septum, entire inferior wall, and apical lateral segment are akinetic. The mid inferolateral segment, mid inferoseptal segment, apical anterior segment, and basal inferoseptal segment are hypokinetic. The mid anterolateral segment is normal. Right Ventricle: The right ventricular size is normal. No increase in right ventricular wall thickness. Right ventricular systolic function is normal. Tricuspid regurgitation signal is inadequate for assessing PA pressure. Left Atrium: Left atrial size was mildly dilated. Right Atrium: Right atrial size was normal in size. Pericardium: A small pericardial effusion is present. There is no evidence of cardiac tamponade. Mitral Valve: The mitral valve is grossly normal. Mild mitral valve regurgitation. No evidence of mitral valve stenosis. Tricuspid Valve: The tricuspid valve is normal in structure. Tricuspid valve regurgitation is trivial. No evidence of tricuspid stenosis. Aortic Valve: The aortic valve is tricuspid. Aortic valve regurgitation is trivial. No aortic stenosis is present. Aortic valve peak gradient measures 7.8 mmHg. Pulmonic Valve: The pulmonic valve was normal in structure. Pulmonic valve regurgitation is trivial. No evidence of pulmonic stenosis. Aorta: The aortic root is normal in size and structure. Venous: The inferior vena cava is dilated in size with less than 50% respiratory variability, suggesting right atrial pressure of 15 mmHg. IAS/Shunts: No atrial level shunt detected by color flow Doppler.  LEFT VENTRICLE PLAX 2D LVIDd:  6.40 cm   Diastology LVIDs:         4.80 cm   LV e' medial:    5.00 cm/s LV PW:         1.50 cm   LV E/e' medial:  22.2 LV IVS:        1.00 cm   LV e' lateral:   7.29 cm/s LVOT diam:     2.30 cm   LV E/e' lateral: 15.2 LV SV:         94 LV SV Index:   37 LVOT Area:     4.15 cm  RIGHT VENTRICLE             IVC RV S prime:     16.90 cm/s  IVC diam: 2.40 cm TAPSE (M-mode): 2.4 cm LEFT ATRIUM             Index        RIGHT ATRIUM            Index LA diam:        4.70 cm 1.85 cm/m   RA Area:     18.60 cm LA Vol (A2C):   86.0 ml 33.91 ml/m  RA Volume:   51.50 ml  20.31 ml/m LA Vol (A4C):   73.3 ml 28.90 ml/m LA Biplane Vol: 84.3 ml 33.24 ml/m  AORTIC VALVE AV Area (Vmax): 3.03 cm AV Vmax:        140.00 cm/s AV Peak Grad:   7.8 mmHg LVOT Vmax:      102.00 cm/s LVOT Vmean:     69.600 cm/s LVOT VTI:       0.227 m  AORTA Ao Root diam: 3.40 cm Ao Asc diam:  3.40 cm MITRAL VALVE MV Area (PHT): 5.84 cm     SHUNTS MV Decel Time: 130 msec     Systemic VTI:  0.23 m MV E velocity: 111.00 cm/s  Systemic Diam: 2.30 cm MV A velocity: 99.10 cm/s MV E/A ratio:  1.12 Soyla Merck MD Electronically signed by Soyla Merck MD Signature Date/Time: 01/09/2024/1:47:23 PM    Final      PERTINENT LAB RESULTS: CBC: Recent Labs    01/10/24 0522 01/11/24 0439  WBC 7.4 8.4  HGB 9.0* 9.0*  HCT 28.2* 27.9*  PLT 156 150   CMET CMP     Component Value Date/Time   NA 140 01/11/2024 0439   NA 144 09/14/2023 1018   K 3.7 01/11/2024 0439   CL 109 01/11/2024 0439   CO2 19 (L) 01/11/2024 0439   GLUCOSE 119 (H) 01/11/2024 0439   BUN 74 (H) 01/11/2024 0439   BUN 74 (H) 09/14/2023 1018   CREATININE 6.64 (H) 01/11/2024 0439   CREATININE 1.24 01/09/2018 1716   CALCIUM  8.4 (L) 01/11/2024 0439   PROT 6.2 (L) 01/11/2024 0439   PROT 6.7 09/14/2023 1018   ALBUMIN 3.4 (L) 01/11/2024 0439   ALBUMIN 4.2 09/14/2023 1018   AST 17 01/11/2024 0439   ALT 19 01/11/2024 0439   ALKPHOS 77 01/11/2024 0439   BILITOT 0.7 01/11/2024 0439   BILITOT 0.5 09/14/2023 1018   EGFR 10 (L) 09/14/2023 1018   GFRNONAA 10 (L) 01/11/2024 0439   GFRNONAA 75 01/09/2018 1716    GFR Estimated Creatinine Clearance: 21.5 mL/min (A) (by C-G formula based on SCr of 6.64 mg/dL (H)). No results for input(s): LIPASE, AMYLASE in the last 72 hours. No results for input(s): CKTOTAL, CKMB, CKMBINDEX, TROPONINI in the last 72 hours. Invalid input(s):  POCBNP No  results for input(s): DDIMER in the last 72 hours. No results for input(s): HGBA1C in the last 72 hours. No results for input(s): CHOL, HDL, LDLCALC, TRIG, CHOLHDL, LDLDIRECT in the last 72 hours. No results for input(s): TSH, T4TOTAL, T3FREE, THYROIDAB in the last 72 hours.  Invalid input(s): FREET3 Recent Labs    01/10/24 0522  FERRITIN 148  TIBC 267  IRON  57   Coags: No results for input(s): INR in the last 72 hours.  Invalid input(s): PT Microbiology: Recent Results (from the past 240 hours)  Resp panel by RT-PCR (RSV, Flu A&B, Covid) Anterior Nasal Swab     Status: None   Collection Time: 01/08/24  1:34 PM   Specimen: Anterior Nasal Swab  Result Value Ref Range Status   SARS Coronavirus 2 by RT PCR NEGATIVE NEGATIVE Final    Comment: (NOTE) SARS-CoV-2 target nucleic acids are NOT DETECTED.  The SARS-CoV-2 RNA is generally detectable in upper respiratory specimens during the acute phase of infection. The lowest concentration of SARS-CoV-2 viral copies this assay can detect is 138 copies/mL. A negative result does not preclude SARS-Cov-2 infection and should not be used as the sole basis for treatment or other patient management decisions. A negative result may occur with  improper specimen collection/handling, submission of specimen other than nasopharyngeal swab, presence of viral mutation(s) within the areas targeted by this assay, and inadequate number of viral copies(<138 copies/mL). A negative result must be combined with clinical observations, patient history, and epidemiological information. The expected result is Negative.  Fact Sheet for Patients:  BloggerCourse.com  Fact Sheet for Healthcare Providers:  SeriousBroker.it  This test is no t yet approved or cleared by the [place]  FDA and  has been authorized for detection and/or diagnosis of SARS-CoV-2 by FDA under an  Emergency Use Authorization (EUA). This EUA will remain  in effect (meaning this test can be used) for the duration of the COVID-19 declaration under Section 564(b)(1) of the Act, 21 U.S.C.section 360bbb-3(b)(1), unless the authorization is terminated  or revoked sooner.       Influenza A by PCR NEGATIVE NEGATIVE Final   Influenza B by PCR NEGATIVE NEGATIVE Final    Comment: (NOTE) The Xpert Xpress SARS-CoV-2/FLU/RSV plus assay is intended as an aid in the diagnosis of influenza from Nasopharyngeal swab specimens and should not be used as a sole basis for treatment. Nasal washings and aspirates are unacceptable for Xpert Xpress SARS-CoV-2/FLU/RSV testing.  Fact Sheet for Patients: BloggerCourse.com  Fact Sheet for Healthcare Providers: SeriousBroker.it  This test is not yet approved or cleared by the [place]  FDA and has been authorized for detection and/or diagnosis of SARS-CoV-2 by FDA under an Emergency Use Authorization (EUA). This EUA will remain in effect (meaning this test can be used) for the duration of the COVID-19 declaration under Section 564(b)(1) of the Act, 21 U.S.C. section 360bbb-3(b)(1), unless the authorization is terminated or revoked.     Resp Syncytial Virus by PCR NEGATIVE NEGATIVE Final    Comment: (NOTE) Fact Sheet for Patients: BloggerCourse.com  Fact Sheet for Healthcare Providers: SeriousBroker.it  This test is not yet approved or cleared by the [place]  FDA and has been authorized for detection and/or diagnosis of SARS-CoV-2 by FDA under an Emergency Use Authorization (EUA). This EUA will remain in effect (meaning this test can be used) for the duration of the COVID-19 declaration under Section 564(b)(1) of the Act, 21 U.S.C. section 360bbb-3(b)(1), unless the authorization is terminated or revoked.  Performed at [facility], [address] [place], [place] [address]   MRSA Next Gen by PCR, Nasal     Status: None   Collection Time: 01/09/24  3:59 AM   Specimen: Nasal Mucosa; Nasal Swab  Result Value Ref Range Status   MRSA by PCR Next Gen NOT DETECTED NOT DETECTED Final    Comment: (NOTE) The GeneXpert MRSA Assay (FDA approved for NASAL specimens only), is one component of a comprehensive MRSA colonization surveillance program. It is not intended to diagnose MRSA infection nor to guide or monitor treatment for MRSA infections. Test performance is not FDA approved in patients less than 71 years old. Performed at [facility], [address], [place], [place] [address]     FURTHER DISCHARGE INSTRUCTIONS:  Get Medicines reviewed and adjusted: Please take all your medications with you for your next visit with your Primary MD  Laboratory/radiological data: Please request your Primary MD to go over all hospital tests and procedure/radiological results at the follow up, please ask your Primary MD to get all Hospital records sent to his/her office.  In some cases, they will be blood work, cultures and biopsy results pending at the time of your discharge. Please request that your primary care M.D. goes through all the records of your hospital data and follows up on these results.  Also Note the following: If you experience worsening of your admission symptoms, develop shortness of breath, life threatening emergency, suicidal or homicidal thoughts you must seek medical attention immediately by calling 911 or calling your MD immediately  if symptoms less severe.  You must read complete instructions/literature along with all the possible adverse reactions/side effects for all the Medicines you take and that have been prescribed to you. Take any new Medicines after you have completely understood and accpet all the possible adverse reactions/side effects.   Do not drive when taking Pain medications  or sleeping medications (Benzodaizepines)  Do not take more than prescribed Pain, Sleep and Anxiety Medications. It is not advisable to combine anxiety,sleep and pain medications without talking with your primary care practitioner  Special Instructions: If you have smoked or chewed Tobacco  in the last 2 yrs please stop smoking, stop any regular Alcohol  and or any Recreational drug use.  Wear Seat belts while driving.  Please note: You were cared for by a hospitalist during your hospital stay. Once you are discharged, your primary care physician will handle any further medical issues. Please note that NO REFILLS for any discharge medications will be authorized once you are discharged, as it is imperative that you return to your primary care physician (or establish a relationship with a primary care physician if you do not have one) for your post hospital discharge needs so that they can reassess your need for medications and monitor your lab values.  Total Time spent coordinating discharge including counseling, education and face to face time equals greater than 30 minutes.  Signed: Emmarae Cowdery 01/11/2024 8:03 AM

## 2024-01-11 NOTE — Plan of Care (Signed)
   Problem: Education: Goal: Ability to describe self-care measures that may prevent or decrease complications (Diabetes Survival Skills Education) will improve Outcome: Progressing   Problem: Coping: Goal: Ability to adjust to condition or change in health will improve Outcome: Progressing   Problem: Fluid Volume: Goal: Ability to maintain a balanced intake and output will improve Outcome: Progressing

## 2024-03-08 ENCOUNTER — Ambulatory Visit: Attending: Internal Medicine | Admitting: Internal Medicine

## 2024-03-08 NOTE — Progress Notes (Deleted)
 Cardiology Office Note:  .    Date:  03/08/2024  ID:  [name], DOB 12-25-1985, MRN [id number] PCP: Marvene Prentice SAUNDERS, FNP  [facility] Providers Cardiologist:  Stanly DELENA Leavens, MD     CC: Transition to new cardiologist  History of Present Illness: .    [name] is a 38 y.o. male l with a history of insulin -dependent diabetes, hypertension, hyperlipidemia, and coronary artery disease, presents for a follow-up consultation. The patient had an acute heart failure episode in December 2023, with a subsequent recovery of ejection fraction to 35% by 2023. He was found to have a 100% LAD, indicative of a late-presenting myocardial infarction (2021), and an LVF of 25-30% with LAD wall motion abnormalities.  The patient's diabetes is managed by Prentice Marvene, FNP. He is morbidly obese, and there is a high risk of sleep apnea. The patient is also anemic, with low blood counts that have remained stable over time.  The patient reports no current chest pain or breathing difficulties. However, he has been experiencing isolated swelling in his left leg. He is not physically active due to the nature of his work and multiple start-up businesses. He reports poor sleep in the past, but it has improved recently (Wife notes snoring and apnea). He has not had breakfast on the day of the consultation and it has been a while since his cholesterol was last checked.  He reports that his blood pressure improves after taking his medications, but it still tends to be a little elevated. He has not taken his blood pressure pills on the day of the consultation.No chest pain.  No shortness of breath or cardiac symptoms.  Relevant histories: .  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   Studies Reviewed: .   Cardiac Studies & Procedures    ______________________________________________________________________________________________ CARDIAC CATHETERIZATION  CARDIAC CATHETERIZATION 01/28/2020  Conclusion  Prox LAD lesion is 100% stenosed. Late presenting anterior MI, as his symptoms have been going on for about 24 hours.  A drug-eluting stent was successfully placed using a SYNERGY XD 2.50X24, postdilated to greater than 3 mm in diameter.  Post intervention, there is a 0% residual stenosis.  There is moderate to severe left ventricular systolic dysfunction with anterior wall hypokinesis.  LV end diastolic pressure is mildly elevated.  The left ventricular ejection fraction is 25-35% by visual estimate.  There is no aortic valve stenosis.  Watch for 48 hours.  Check echocardiogram.  May need to consider LifeVest.  Holding lisinopril  at this time since his creatinine was elevated in the Cath Lab at the time of procedure.  1 year of dual antiplatelet therapy recommended along with aggressive secondary prevention.  Findings Coronary Findings Diagnostic  Dominance: Co-dominant  Left Anterior Descending Prox LAD lesion is 100% stenosed.  Right Coronary Artery The vessel exhibits minimal luminal irregularities.  Intervention  Prox LAD lesion Stent CATH LAUNCHER 6FR EBU3.5 guide catheter was inserted. Lesion crossed with guidewire using a WIRE FIGHTER CROSSING. Pre-stent angioplasty was performed using a BALLOON SAPPHIRE 2.0X12. A drug-eluting stent was successfully placed using a SYNERGY XD 2.50X24. Stent strut is well apposed. Post-stent angioplasty was performed using a BALLOON SAPPHIRE [facility] 3.0X15. There was difficulty in crossing this lesion which caused a delay in reperfusion.  Since he presented late, a Fighter wire was needed to cross as the lesion acted more chronic.  There was difficulty in locating the distal vessel as  well due to the anatomy. Post-Intervention Lesion Assessment The intervention was  successful. Pre-interventional TIMI flow is 0. Post-intervention TIMI flow is 3. No complications occurred at this lesion. There is a 0% residual stenosis post intervention.     ECHOCARDIOGRAM  ECHOCARDIOGRAM COMPLETE 01/09/2024  Narrative ECHOCARDIOGRAM REPORT    Patient Name:   [name] Date of Exam: 01/09/2024 Medical Rec #:  [id number]          Height:       76.0 in Accession #:    [id number]         Weight:       275.0 lb Date of Birth:  [birth year]          BSA:          2.536 m Patient Age:    38 years           BP:           160/99 mmHg Patient Gender: M                  HR:           81 bpm. Exam Location:  Inpatient  Procedure: 2D Echo, Cardiac Doppler, Color Doppler and Intracardiac Opacification Agent (Both Spectral and Color Flow Doppler were utilized during procedure).  Indications:    CHF-Acute Systolic I50.21  History:        Patient has prior history of Echocardiogram examinations, most recent 04/01/2020. Cardiomegaly, Acute MI and CAD, CKD, stage 5, Signs/Symptoms:Chest Pain; Risk Factors:Hypertension and Dyslipidemia.  Sonographer:    Thea Norlander RCS Referring Phys: MARSA NOVAK MELVIN  IMPRESSIONS   1. Left ventricular ejection fraction, by estimation, is 30 to 35%. The left ventricle has moderately decreased function. The left ventricle demonstrates regional wall motion abnormalities (see scoring diagram/findings for description). The left ventricular internal cavity size was moderately dilated. There is moderate asymmetric left ventricular hypertrophy of the infero-lateral segment. Left ventricular diastolic parameters are indeterminate. 2. Right ventricular systolic function is normal. The right ventricular size is normal. Tricuspid regurgitation signal is inadequate for assessing PA pressure. 3. Left atrial size was mildly dilated. 4. A small pericardial effusion is present. There is no evidence of cardiac tamponade. 5. The mitral valve is  grossly normal. Mild mitral valve regurgitation. No evidence of mitral stenosis. 6. The aortic valve is tricuspid. Aortic valve regurgitation is trivial. No aortic stenosis is present. 7. The inferior vena cava is dilated in size with <50% respiratory variability, suggesting right atrial pressure of 15 mmHg.  FINDINGS Left Ventricle: Left ventricular ejection fraction, by estimation, is 30 to 35%. The left ventricle has moderately decreased function. The left ventricle demonstrates regional wall motion abnormalities. Definity  contrast agent was given IV to delineate the left ventricular endocardial borders. The left ventricular internal cavity size was moderately dilated. There is moderate asymmetric left ventricular hypertrophy of the infero-lateral segment. Left ventricular diastolic parameters are indeterminate.   LV Wall Scoring: The mid and distal anterior septum, entire inferior wall, and apical lateral segment are akinetic. The mid inferolateral segment, mid inferoseptal segment, apical anterior segment, and basal inferoseptal segment are hypokinetic. The mid anterolateral segment is normal.  Right Ventricle: The right ventricular size is normal. No increase in right ventricular wall thickness. Right ventricular systolic function is normal. Tricuspid regurgitation signal is inadequate for assessing PA pressure.  Left Atrium: Left atrial size was mildly dilated.  Right Atrium: Right atrial size was normal in size.  Pericardium: A small  pericardial effusion is present. There is no evidence of cardiac tamponade.  Mitral Valve: The mitral valve is grossly normal. Mild mitral valve regurgitation. No evidence of mitral valve stenosis.  Tricuspid Valve: The tricuspid valve is normal in structure. Tricuspid valve regurgitation is trivial. No evidence of tricuspid stenosis.  Aortic Valve: The aortic valve is tricuspid. Aortic valve regurgitation is trivial. No aortic stenosis is present.  Aortic valve peak gradient measures 7.8 mmHg.  Pulmonic Valve: The pulmonic valve was normal in structure. Pulmonic valve regurgitation is trivial. No evidence of pulmonic stenosis.  Aorta: The aortic root is normal in size and structure.  Venous: The inferior vena cava is dilated in size with less than 50% respiratory variability, suggesting right atrial pressure of 15 mmHg.  IAS/Shunts: No atrial level shunt detected by color flow Doppler.   LEFT VENTRICLE PLAX 2D LVIDd:         6.40 cm   Diastology LVIDs:         4.80 cm   LV e' medial:    5.00 cm/s LV PW:         1.50 cm   LV E/e' medial:  22.2 LV IVS:        1.00 cm   LV e' lateral:   7.29 cm/s LVOT diam:     2.30 cm   LV E/e' lateral: 15.2 LV SV:         94 LV SV Index:   37 LVOT Area:     4.15 cm   RIGHT VENTRICLE             IVC RV S prime:     16.90 cm/s  IVC diam: 2.40 cm TAPSE (M-mode): 2.4 cm  LEFT ATRIUM             Index        RIGHT ATRIUM           Index LA diam:        4.70 cm 1.85 cm/m   RA Area:     18.60 cm LA Vol (A2C):   86.0 ml 33.91 ml/m  RA Volume:   51.50 ml  20.31 ml/m LA Vol (A4C):   73.3 ml 28.90 ml/m LA Biplane Vol: 84.3 ml 33.24 ml/m AORTIC VALVE AV Area (Vmax): 3.03 cm AV Vmax:        140.00 cm/s AV Peak Grad:   7.8 mmHg LVOT Vmax:      102.00 cm/s LVOT Vmean:     69.600 cm/s LVOT VTI:       0.227 m  AORTA Ao Root diam: 3.40 cm Ao Asc diam:  3.40 cm  MITRAL VALVE MV Area (PHT): 5.84 cm     SHUNTS MV Decel Time: 130 msec     Systemic VTI:  0.23 m MV E velocity: 111.00 cm/s  Systemic Diam: 2.30 cm MV A velocity: 99.10 cm/s MV E/A ratio:  1.12  Soyla Merck MD Electronically signed by Soyla Merck MD Signature Date/Time: 01/09/2024/1:47:23 PM    Final          ______________________________________________________________________________________________       Physical Exam:    VS:  There were no vitals taken for this visit.   Wt Readings from Last 3  Encounters:  01/10/24 280 lb 3.3 oz (127.1 kg)  09/14/23 274 lb 6.4 oz (124.5 kg)  05/19/23 290 lb 9.6 oz (131.8 kg)    Gen: no distress, morbid obesity   Neck: No JVD,  Cardiac: No Rubs  or Gallops, no murmur, RRR +2 radial pulses Respiratory: Clear to auscultation bilaterally, normal effort, normal  respiratory rate GI: Soft, nontender, non-distended  MS: +1 left leg swelling, moves all extremities Integument: Skin feels warm Neuro:  At time of evaluation, alert and oriented to person/place/time/situation  Psych: Normal affect, patient feels well   ASSESSMENT AND PLAN: .    Coronary Artery Disease - History of 100% LAD occlusion with late presenting myocardial infarction. Atypical chest pain, medically managed. On aspirin , atorvastatin , carvedilol , and isosorbide  dinitrate. -Discontinue Plavix  due to equal risk/benefit at this stage post-MI from 2021 -Continue aspirin , atorvastatin , carvedilol , and isosorbide  dinitrate.  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 On Jardiance , isosorbide  dinitrate, hydralazine , and nitroglycerin  PRN. - NYHA I, Stage C -Increase hydralazine  to 75mg  TID. -Order repeat echocardiogram to assess current ejection fraction.  Hypertension - he has not taken his medication today, elevated blood pressure readings, on hydralazine . -Increase hydralazine  to 75mg  TID  Chronic Kidney Disease Stage 4 - Complicating management of ischemic heart disease and heart failure. No current plans for dialysis. -Continue current management and follow-up with nephrologist (send note to Dr. Dalene)  Anemia Chronic anemia with hemoglobin 10.6. Possible iron  deficiency. -Order iron  studies to assess for iron  deficiency anemia.  Hyperlipidemia On atorvastatin . -Order fasting lipids and LP(a) to assess current lipid control.  Morbid Obesity Discussed  lifestyle changes and potential future medication options for weight loss. -Encourage diet and lifestyle changes. - no history of MEN or medullary thyroid cancer - no history of pancreatitis or gallstones. - no history of reflux - would meet criteria for GLP-1 based therapy - we have discussed risks for reflux - discussed risks of sarcopenia and exercise methods to decrease this risk - discussed protein intake, 25 g fiber intake, hydration, small meals, and decreasing high fats to minimize symptom burden - discussed this as a long term therapy  Suspected Obstructive Sleep Apnea High risk based on obesity and reported symptoms (STOPBANG 5) -Order home sleep study to assess for obstructive sleep apnea.  Follow-up Plans -See Rosaline Bane NP in 3 months for secondary prevention discussion. -Return visit in 6 months to assess response to changes in medical therapy.  Time Spent Directly with Patient:   I have spent a total of 54 minutes with the patient reviewing notes, imaging, EKGs, labs, discussing lifestyle interventions and examining the patient as well as establishing an assessment and plan that was discussed personally with the patient and his wife.   Stanly Leavens, MD FASE [facility] Cardiologist [facility]  [address] [place], [address] [place], [place] [address] [phone]  8:13 AM

## 2024-03-09 ENCOUNTER — Encounter: Payer: Self-pay | Admitting: Internal Medicine

## 2024-03-18 ENCOUNTER — Other Ambulatory Visit: Payer: Self-pay | Admitting: Internal Medicine

## 2024-04-14 ENCOUNTER — Inpatient Hospital Stay (HOSPITAL_COMMUNITY)

## 2024-04-14 ENCOUNTER — Encounter (HOSPITAL_COMMUNITY): Payer: Self-pay

## 2024-04-14 ENCOUNTER — Observation Stay (HOSPITAL_COMMUNITY)
Admission: EM | Admit: 2024-04-14 | Discharge: 2024-04-16 | Disposition: A | Discharge status: Home / Self Care | Attending: Internal Medicine | Admitting: Internal Medicine

## 2024-04-14 ENCOUNTER — Encounter (HOSPITAL_COMMUNITY): Payer: Self-pay | Admitting: Internal Medicine

## 2024-04-14 ENCOUNTER — Other Ambulatory Visit: Payer: Self-pay

## 2024-04-14 ENCOUNTER — Emergency Department (HOSPITAL_COMMUNITY)

## 2024-04-14 DIAGNOSIS — R0602 Shortness of breath: Secondary | ICD-10-CM | POA: Diagnosis present

## 2024-04-14 DIAGNOSIS — D519 Vitamin B12 deficiency anemia, unspecified: Secondary | ICD-10-CM | POA: Insufficient documentation

## 2024-04-14 DIAGNOSIS — I2510 Atherosclerotic heart disease of native coronary artery without angina pectoris: Secondary | ICD-10-CM | POA: Insufficient documentation

## 2024-04-14 DIAGNOSIS — I509 Heart failure, unspecified: Secondary | ICD-10-CM

## 2024-04-14 DIAGNOSIS — I160 Hypertensive urgency: Secondary | ICD-10-CM | POA: Diagnosis not present

## 2024-04-14 DIAGNOSIS — D649 Anemia, unspecified: Secondary | ICD-10-CM | POA: Diagnosis not present

## 2024-04-14 LAB — BASIC METABOLIC PANEL WITH GFR
Anion gap: 14 (ref 5–15)
BUN: 54 mg/dL — ABNORMAL HIGH (ref 6–20)
CO2: 19 mmol/L — ABNORMAL LOW (ref 22–32)
Calcium: 7.9 mg/dL — ABNORMAL LOW (ref 8.9–10.3)
Chloride: 107 mmol/L (ref 98–111)
Creatinine, Ser: 7.28 mg/dL — ABNORMAL HIGH (ref 0.61–1.24)
GFR, Estimated: 9 mL/min — ABNORMAL LOW (ref >60)
Glucose, Bld: 98 mg/dL (ref 70–99)
Potassium: 3.5 mmol/L (ref 3.5–5.1)
Sodium: 140 mmol/L (ref 135–145)

## 2024-04-14 LAB — RESP PANEL BY RT-PCR (RSV, FLU A&B, COVID)  RVPGX2
Influenza A by PCR: NEGATIVE
Influenza B by PCR: NEGATIVE
Resp Syncytial Virus by PCR: NEGATIVE
SARS Coronavirus 2 by RT PCR: NEGATIVE

## 2024-04-14 LAB — CBC
HCT: 22.8 % — ABNORMAL LOW (ref 39.0–52.0)
Hemoglobin: 7 g/dL — ABNORMAL LOW (ref 13.0–17.0)
MCH: 29.8 pg (ref 26.0–34.0)
MCHC: 30.7 g/dL (ref 30.0–36.0)
MCV: 97 fL (ref 80.0–100.0)
Platelets: 126 K/uL — ABNORMAL LOW (ref 150–400)
RBC: 2.35 MIL/uL — ABNORMAL LOW (ref 4.22–5.81)
RDW: 14 % (ref 11.5–15.5)
WBC: 5.7 K/uL (ref 4.0–10.5)
nRBC: 0 % (ref 0.0–0.2)

## 2024-04-14 LAB — HEPATIC FUNCTION PANEL
ALT: 20 U/L (ref 0–44)
AST: 18 U/L (ref 15–41)
Albumin: 3.2 g/dL — ABNORMAL LOW (ref 3.5–5.0)
Alkaline Phosphatase: 68 U/L (ref 38–126)
Bilirubin, Direct: 0.2 mg/dL (ref 0.0–0.2)
Indirect Bilirubin: 1.2 mg/dL — ABNORMAL HIGH (ref 0.3–0.9)
Total Bilirubin: 1.4 mg/dL — ABNORMAL HIGH (ref 0.0–1.2)
Total Protein: 6.2 g/dL — ABNORMAL LOW (ref 6.5–8.1)

## 2024-04-14 LAB — PREPARE RBC (CROSSMATCH)

## 2024-04-14 LAB — MAGNESIUM: Magnesium: 2.2 mg/dL (ref 1.7–2.4)

## 2024-04-14 LAB — TROPONIN I (HIGH SENSITIVITY)
Troponin I (High Sensitivity): 18 ng/L — ABNORMAL HIGH (ref <18)
Troponin I (High Sensitivity): 16 ng/L (ref <18)

## 2024-04-14 LAB — GLUCOSE, CAPILLARY: Glucose-Capillary: 92 mg/dL (ref 70–99)

## 2024-04-14 LAB — BRAIN NATRIURETIC PEPTIDE: B Natriuretic Peptide: 701.5 pg/mL — ABNORMAL HIGH (ref 0.0–100.0)

## 2024-04-14 MED ORDER — EZETIMIBE 10 MG PO TABS
10.0000 mg | ORAL_TABLET | Freq: Every day | ORAL | Status: DC
Start: 2024-04-15 — End: 2024-04-16
  Administered 2024-04-15 – 2024-04-16 (×2): 10 mg via ORAL
  Filled 2024-04-14 (×2): qty 1

## 2024-04-14 MED ORDER — ISOSORBIDE DINITRATE 10 MG PO TABS
20.0000 mg | ORAL_TABLET | Freq: Three times a day (TID) | ORAL | Status: DC
  Administered 2024-04-14 – 2024-04-16 (×6): 20 mg via ORAL
  Filled 2024-04-14 (×6): qty 2

## 2024-04-14 MED ORDER — CARVEDILOL 25 MG PO TABS
25.0000 mg | ORAL_TABLET | Freq: Two times a day (BID) | ORAL | Status: DC
  Administered 2024-04-14 – 2024-04-16 (×4): 25 mg via ORAL
  Filled 2024-04-14: qty 1
  Filled 2024-04-14: qty 2
  Filled 2024-04-14 (×2): qty 1

## 2024-04-14 MED ORDER — ONDANSETRON HCL 4 MG/2ML IJ SOLN: 4.0000 mg | Freq: Four times a day (QID) | INTRAMUSCULAR | Status: DC | PRN

## 2024-04-14 MED ORDER — ACETAMINOPHEN 650 MG RE SUPP: 650.0000 mg | Freq: Four times a day (QID) | RECTAL | Status: DC | PRN

## 2024-04-14 MED ORDER — TORSEMIDE 20 MG PO TABS: 20.0000 mg | ORAL_TABLET | Freq: Two times a day (BID) | ORAL | Status: DC

## 2024-04-14 MED ORDER — PANTOPRAZOLE SODIUM 40 MG IV SOLR
40.0000 mg | Freq: Two times a day (BID) | INTRAVENOUS | Status: DC
  Administered 2024-04-14 – 2024-04-15 (×3): 40 mg via INTRAVENOUS
  Filled 2024-04-14 (×4): qty 10

## 2024-04-14 MED ORDER — ACETAMINOPHEN 500 MG PO TABS
1000.0000 mg | ORAL_TABLET | Freq: Once | ORAL | Status: AC
  Administered 2024-04-14: 1000 mg via ORAL
  Filled 2024-04-14: qty 2

## 2024-04-14 MED ORDER — SODIUM CHLORIDE 0.9% FLUSH
3.0000 mL | Freq: Two times a day (BID) | INTRAVENOUS | Status: DC
  Administered 2024-04-14 – 2024-04-16 (×4): 3 mL via INTRAVENOUS

## 2024-04-14 MED ORDER — HYDRALAZINE HCL 20 MG/ML IJ SOLN
5.0000 mg | INTRAMUSCULAR | Status: DC | PRN
  Administered 2024-04-14 – 2024-04-15 (×2): 5 mg via INTRAVENOUS
  Filled 2024-04-14 (×2): qty 1

## 2024-04-14 MED ORDER — ONDANSETRON HCL 4 MG PO TABS: 4.0000 mg | ORAL_TABLET | Freq: Four times a day (QID) | ORAL | Status: DC | PRN

## 2024-04-14 MED ORDER — SODIUM CHLORIDE 0.9% IV SOLUTION: Freq: Once | INTRAVENOUS | Status: AC

## 2024-04-14 MED ORDER — ACETAMINOPHEN 325 MG PO TABS: 650.0000 mg | ORAL_TABLET | Freq: Four times a day (QID) | ORAL | Status: DC | PRN

## 2024-04-14 MED ORDER — BISACODYL 5 MG PO TBEC: 5.0000 mg | DELAYED_RELEASE_TABLET | Freq: Every day | ORAL | Status: DC | PRN

## 2024-04-14 MED ORDER — TORSEMIDE 20 MG PO TABS
20.0000 mg | ORAL_TABLET | Freq: Two times a day (BID) | ORAL | Status: DC
  Administered 2024-04-14 – 2024-04-16 (×4): 20 mg via ORAL
  Filled 2024-04-14 (×4): qty 1

## 2024-04-14 MED ORDER — ASPIRIN 81 MG PO TBEC
81.0000 mg | DELAYED_RELEASE_TABLET | Freq: Every day | ORAL | Status: DC
Start: 2024-04-15 — End: 2024-04-14

## 2024-04-14 MED ORDER — HYDRALAZINE HCL 50 MG PO TABS
50.0000 mg | ORAL_TABLET | Freq: Three times a day (TID) | ORAL | Status: DC
  Administered 2024-04-14 – 2024-04-15 (×4): 50 mg via ORAL
  Filled 2024-04-14 (×4): qty 1

## 2024-04-14 MED ORDER — HYDRALAZINE HCL 20 MG/ML IJ SOLN
10.0000 mg | Freq: Once | INTRAMUSCULAR | Status: AC
  Administered 2024-04-14: 10 mg via INTRAVENOUS
  Filled 2024-04-14: qty 1

## 2024-04-14 MED ORDER — NITROGLYCERIN 0.4 MG SL SUBL: 0.4000 mg | SUBLINGUAL_TABLET | SUBLINGUAL | Status: DC | PRN

## 2024-04-14 MED ORDER — ATORVASTATIN CALCIUM 80 MG PO TABS
80.0000 mg | ORAL_TABLET | Freq: Every day | ORAL | Status: DC
  Administered 2024-04-15 – 2024-04-16 (×2): 80 mg via ORAL
  Filled 2024-04-14 (×2): qty 1

## 2024-04-14 NOTE — Consult Note (Signed)
 Renal Service Consult Note [facility] Greg JONETTA Fret, Richardson  Patient: [name] Date: 04/14/2024 Requesting Physician: Dr. CHARLENA Blanch  Reason for Consult: Renal failure  HPI: The patient is a 38 y.o. year-old w/ PMH of DM2, CKD V, CAD, HTN, HFrEF, anemia who presented to ED today c/o chest pain, n/v and dizziness.  In ED BP's wer high 172/81, HR 75, RR 11-18, temp 98. BUN 54, creat 7.28 (b/l 6-6.5), alb 3.2, lft's ok, BNP 701, Hb 7, wbc 5K.  CXR was negative. Is 100% on RA in ED. Trop 18, 16. Pt admitted for anemia. We are asked to see for renal failure.    Pt seen in hospital room. Pt states he and Dr Macel have discussed dialysis, but not in detail, and no plans for access discussed. Pt states has mild nausea from time to time, good appetite, no vomiting and no confusion or tremors. Good urine output. No problems w/ fluid overload , takes torsemide  at home 40mg  bid (or every day).    ROS - denies CP, no joint pain, no HA, no blurry vision, no rash, no diarrhea, no nausea/ vomiting   Past Medical History  Past Medical History:  Diagnosis Date   Coronary artery disease    Diabetes mellitus without complication (HCC)    Hypercholesteremia    Hypertension    Myocardial infarction (HCC) 01/2020   Past Surgical History  Past Surgical History:  Procedure Laterality Date   CORONARY STENT INTERVENTION N/A 01/28/2020   Procedure: CORONARY STENT INTERVENTION;  Surgeon: Greg Candyce RAMAN, Richardson;  Location: [facility] INVASIVE CV LAB;  Service: Cardiovascular;  Laterality: N/A;   CORONARY/GRAFT ACUTE MI REVASCULARIZATION N/A 01/28/2020   Procedure: Coronary/Graft Acute MI Revascularization;  Surgeon: Greg Candyce RAMAN, Richardson;  Location: [facility] INVASIVE CV LAB;  Service: Cardiovascular;  Laterality: N/A;   LEFT HEART CATH AND CORONARY ANGIOGRAPHY N/A 01/28/2020   Procedure: LEFT HEART CATH AND CORONARY ANGIOGRAPHY;  Surgeon: Greg Candyce RAMAN, Richardson;  Location: [facility] INVASIVE CV LAB;   Service: Cardiovascular;  Laterality: N/A;   Family History  Family History  Problem Relation Age of Onset   Diabetes Father    Heart disease Father    Stroke Maternal Grandmother 74   Social History  reports that he has never smoked. He has never used smokeless tobacco. He reports that he does not drink alcohol and does not use drugs. Allergies No Known Allergies Home medications Prior to Admission medications   Medication Sig Start Date End Date Taking? Authorizing Provider  aspirin  EC 81 MG tablet Take 81 mg by mouth daily. Swallow whole.    Provider, Historical, Richardson  atorvastatin  (LIPITOR ) 80 MG tablet TAKE 1 TABLET BY MOUTH EVERY DAY 03/20/24   [name]  carvedilol  (COREG ) 25 MG tablet TAKE 1 TABLET BY MOUTH TWICE A DAY IN THE MORNING AND IN THE EVENING 04/28/23   [name]  ezetimibe  (ZETIA ) 10 MG tablet TAKE 1 TABLET BY MOUTH EVERY DAY 03/20/24   [name]  hydrALAZINE  (APRESOLINE ) 50 MG tablet TAKE 1 & 1/2 TABLETS BY MOUTH 3 TIMES DAILY 12/13/23   [name]  isosorbide  dinitrate (ISORDIL ) 20 MG tablet TAKE 1 TABLET BY MOUTH THREE TIMES A DAY 05/06/23   [name]  nitroGLYCERIN  (NITROSTAT ) 0.4 MG SL tablet Place 1 tablet (0.4 mg total) under the tongue every 5 (five) minutes as needed for chest pain. 02/02/21 01/08/24  Greg Candyce RAMAN, Richardson  torsemide  (DEMADEX ) 20 MG tablet Take 2 tablets (40 mg total) by mouth 2 (two) times daily. 01/11/24   Greg Donalda HERO, Richardson     Vitals:   04/14/24 1315 04/14/24 1545 04/14/24 1555 04/14/24 1645  BP: (!) 165/74 (!) 188/94  (!) 184/106  Pulse: 75   75  Resp: 14 14  14   Temp:   98.5 F (36.9 C)   TempSrc:   Oral   SpO2: 100% 100%  99%  Weight:      Height:       Exam Gen alert, no distress Sclera anicteric, throat clear  No jvd, flat neck veins Chest clear bilat to bases RRR no MRG Abd soft ntnd no mass or ascites +bs Ext trace ankle edema bilat, no other  edema Neuro is alert, Ox 3 , nf   Home bp meds: Coreg  25 bid Hydralazine  75mg  tid Isordil  20mg  tid Demadex  40mg  bid    Date   Creat  eGFR (ml/min) 2012- 2016  0.80- 1.14 2018- 2019  1.10- 1.51  2020   1.24- 2.64 Feb 2021  1.31 Jan 2020  2.00- 7.44 Oct 2021  2.30 May 2020  1.46 Jan- nov 2022  1.70- 2.84 2023   2.48- 4.57 Oct 2024  5.23 September 2023  6.63 July 2025  6.30- 6.64 10- 11 ml/min 04/14/24  7.28   9 ml/min     BP: bp's high since, 172/81 range   UA: pend UNa , UCr pend Renal US : pend CXR 11/01: no active disease Labs --> Na 140, K+ 3.5, CO2 19  BUN 54, creat 7.28, alb 3.2, Hb 7.0  Assessment/ Plan: AKI on CKD 5: b/l creatinine is 6.3- 6.6, from July 2025, eGFR 10-11 ml/min. Creat is 7.2 here in the setting of sig fatigue, loss of energy. On exam pt appears euvolemic, w/ clear lungs and normal CXR, flat neck veins, minimal LE edema. He should tolerated prbc's okay, would continue his home torsemide  at 40mg  bid for now. If develops vol issues, we can diurese more aggressively w/ IV lasix  but does not need this now. AKI is likely progression of CKD 5. Pt if fatigued, maybe d/t anemia, but is not otherwise significantly uremic and does not require HD at this time. Get renal US  and UA, lytes. Will follow.   Anemia: due to renal failure in part, ok to transfuse. Cont po torsemide  at home dosing. Get tsat and ferritin.  HTN: he is getting his home bp meds including coreg  and hydralazine  per pmd. BP's slightly high.       Greg Fret  Richardson CKA 04/14/2024, 5:17 PM  Recent Labs  Lab 04/14/24 1110  CREATININE 7.28*  K 3.5   Inpatient medications:  aspirin  EC  81 mg Oral Daily   atorvastatin   80 mg Oral Daily   carvedilol   25 mg Oral BID WC   ezetimibe   10 mg Oral Daily   hydrALAZINE   50 mg Oral Q8H   isosorbide  dinitrate  20 mg Oral TID   sodium chloride  flush  3 mL Intravenous Q12H    acetaminophen  **OR** acetaminophen , bisacodyl, hydrALAZINE , nitroGLYCERIN ,  ondansetron  **OR** ondansetron  (ZOFRAN ) IV

## 2024-04-14 NOTE — H&P (Signed)
 History and Physical    Patient: [name] DOB: [birth year] DOA: 04/14/2024 DOS: the patient was seen and examined on 04/14/2024 . PCP: Marvene Prentice SAUNDERS, FNP  Patient coming from: Home Chief complaint: Chief Complaint  Patient presents with   Chest Pain   Dizziness   Nausea   HPI:  [name] is a 38 y.o. male with past medical history  of CKD stage IV, CAD, diabetes mellitus type 2 on insulin , essential hypertension, HFrEF, anemia of chronic kidney disease, presents today with chest discomfort shortness of breath dizziness nausea.  Patient at bedside states that he has been feeling extremely fatigued and has no energy at all.  Sees Dr. Dalene for his CKD stage V has had blood transfusions in the past.  Patient reports that he had been having headaches shortness of breath nausea chest pains.  ED Course:  Vital signs in the ED were notable for the following:  Vitals:   04/14/24 1555 04/14/24 1645 04/14/24 1747 04/14/24 1837  BP:  (!) 184/106 (!) 190/98 (!) 164/75  Pulse:  75  78  Temp: 98.5 F (36.9 C)   97.8 F (36.6 C)  Resp:  14  15  Height:    6' 4 (1.93 m)  Weight:    124.7 kg  SpO2:  99%  99%  TempSrc: Oral   Oral  BMI (Calculated):    33.48   >>ED evaluation thus far shows: Initial BMP showing bicarb of 19 BUN of 54 creatinine 7.28 GFR of 9 calcium  7.9 magnesium 2.2 indirect bili 1.2 total bili 1.4. BNP of 701.5, troponin of 18 and repeat of 16. EKG shows sinus rhythm 73 PR 165 QRS 110 QTc 486 diffusely low voltage. CBC show anemia with a hemoglobin of 7, MCV 97, platelets 126 normal white count. Respiratory panel negative. Head CT noncontrast negative for any acute intracranial abnormality. Chest x-ray is negative for any acute cardiopulmonary processes.  >>While in the ED patient received the following: Medications  acetaminophen  (TYLENOL ) tablet 1,000 mg (has no administration in time range)   Review of Systems  Respiratory:   Positive for cough, shortness of breath and wheezing.    Past Medical History:  Diagnosis Date   Coronary artery disease    Diabetes mellitus without complication (HCC)    Hypercholesteremia    Hypertension    Myocardial infarction (HCC) 01/2020   Past Surgical History:  Procedure Laterality Date   CORONARY STENT INTERVENTION N/A 01/28/2020   Procedure: CORONARY STENT INTERVENTION;  Surgeon: Dann Candyce RAMAN, MD;  Location: [facility] INVASIVE CV LAB;  Service: Cardiovascular;  Laterality: N/A;   CORONARY/GRAFT ACUTE MI REVASCULARIZATION N/A 01/28/2020   Procedure: Coronary/Graft Acute MI Revascularization;  Surgeon: Dann Candyce RAMAN, MD;  Location: [facility] INVASIVE CV LAB;  Service: Cardiovascular;  Laterality: N/A;   LEFT HEART CATH AND CORONARY ANGIOGRAPHY N/A 01/28/2020   Procedure: LEFT HEART CATH AND CORONARY ANGIOGRAPHY;  Surgeon: Dann Candyce RAMAN, MD;  Location: [facility] INVASIVE CV LAB;  Service: Cardiovascular;  Laterality: N/A;    reports that he has never smoked. He has never used smokeless tobacco. He reports that he does not drink alcohol and does not use drugs. No Known Allergies Family History  Problem Relation Age of Onset   Diabetes Father    Heart disease Father    Stroke Maternal Grandmother 41   Prior to Admission medications   Medication Sig Start Date End Date Taking? Authorizing Provider  aspirin  EC 81 MG tablet Take 81  mg by mouth daily. Swallow whole.    [provider]  atorvastatin  (LIPITOR ) 80 MG tablet TAKE 1 TABLET BY MOUTH EVERY DAY 03/20/24   Lelon Hamilton T, PA-C  carvedilol  (COREG ) 25 MG tablet TAKE 1 TABLET BY MOUTH TWICE A DAY IN THE MORNING AND IN THE EVENING 04/28/23   Chandrasekhar, Mahesh A, MD  ezetimibe  (ZETIA ) 10 MG tablet TAKE 1 TABLET BY MOUTH EVERY DAY 03/20/24   Lelon, Scott T, PA-C  hydrALAZINE  (APRESOLINE ) 50 MG tablet TAKE 1 & 1/2 TABLETS BY MOUTH 3 TIMES DAILY 12/13/23   Chandrasekhar, Mahesh A, MD  isosorbide  dinitrate (ISORDIL ) 20 MG  tablet TAKE 1 TABLET BY MOUTH THREE TIMES A DAY 05/06/23   Chandrasekhar, Mahesh A, MD  nitroGLYCERIN  (NITROSTAT ) 0.4 MG SL tablet Place 1 tablet (0.4 mg total) under the tongue every 5 (five) minutes as needed for chest pain. 02/02/21 01/08/24  Dann Candyce RAMAN, MD  torsemide  (DEMADEX ) 20 MG tablet Take 2 tablets (40 mg total) by mouth 2 (two) times daily. 01/11/24   Raenelle Donalda HERO, MD                                                                                 Vitals:   04/14/24 1555 04/14/24 1645 04/14/24 1747 04/14/24 1837  BP:  (!) 184/106 (!) 190/98 (!) 164/75  Pulse:  75  78  Resp:  14  15  Temp: 98.5 F (36.9 C)   97.8 F (36.6 C)  TempSrc: Oral   Oral  SpO2:  99%  99%  Weight:    124.7 kg  Height:    6' 4 (1.93 m)   Physical Exam Vitals reviewed.  Constitutional:      General: He is not in acute distress.    Appearance: He is not ill-appearing.  HENT:     Head: Normocephalic and atraumatic.  Eyes:     Extraocular Movements: Extraocular movements intact.  Cardiovascular:     Rate and Rhythm: Normal rate and regular rhythm.     Pulses: Normal pulses.     Heart sounds: Normal heart sounds.  Pulmonary:     Effort: No tachypnea, bradypnea or accessory muscle usage.     Breath sounds: Examination of the right-middle field reveals rales. Examination of the left-middle field reveals rales. Examination of the right-lower field reveals rales. Examination of the left-lower field reveals rales. Rales present.  Abdominal:     General: There is no distension.     Palpations: Abdomen is soft.     Tenderness: There is no abdominal tenderness.  Musculoskeletal:     Right lower leg: Edema present.     Left lower leg: Edema present.  Neurological:     General: No focal deficit present.     Mental Status: He is alert and oriented to person, place, and time.     Labs on Admission: I have personally reviewed following labs and imaging studies CBC: Recent Labs  Lab  04/14/24 1110  WBC 5.7  HGB 7.0*  HCT 22.8*  MCV 97.0  PLT 126*   Basic Metabolic Panel: Recent Labs  Lab 04/14/24 1110 04/14/24 1305  NA 140  --  K 3.5  --   CL 107  --   CO2 19*  --   GLUCOSE 98  --   BUN 54*  --   CREATININE 7.28*  --   CALCIUM  7.9*  --   MG  --  2.2   GFR: Estimated Creatinine Clearance: 19.5 mL/min (A) (by C-G formula based on SCr of 7.28 mg/dL (H)). Liver Function Tests: Recent Labs  Lab 04/14/24 1305  AST 18  ALT 20  ALKPHOS 68  BILITOT 1.4*  PROT 6.2*  ALBUMIN 3.2*   No results for input(s): LIPASE, AMYLASE in the last 168 hours. No results for input(s): AMMONIA in the last 168 hours. Recent Labs    09/14/23 1018 01/08/24 1208 01/09/24 0633 01/10/24 0522 01/11/24 0439 04/14/24 1110  BUN 74* 68* 63* 67* 74* 54*  CREATININE 6.63* 6.61* 6.50* 6.30* 6.64* 7.28*    Cardiac Enzymes: No results for input(s): CKTOTAL, CKMB, CKMBINDEX, TROPONINI in the last 168 hours. BNP (last 3 results) No results for input(s): PROBNP in the last 8760 hours. HbA1C: No results for input(s): HGBA1C in the last 72 hours. CBG: No results for input(s): GLUCAP in the last 168 hours. Lipid Profile: No results for input(s): CHOL, HDL, LDLCALC, TRIG, CHOLHDL, LDLDIRECT in the last 72 hours. Thyroid Function Tests: No results for input(s): TSH, T4TOTAL, FREET4, T3FREE, THYROIDAB in the last 72 hours. Anemia Panel: No results for input(s): VITAMINB12, FOLATE, FERRITIN, TIBC, IRON , RETICCTPCT in the last 72 hours. Urine analysis:    Component Value Date/Time   COLORURINE YELLOW 02/24/2022 0630   APPEARANCEUR CLEAR 02/24/2022 0630   LABSPEC 1.012 02/24/2022 0630   PHURINE 5.0 02/24/2022 0630   GLUCOSEU >=500 (A) 02/24/2022 0630   HGBUR NEGATIVE 02/24/2022 0630   BILIRUBINUR NEGATIVE 02/24/2022 0630   KETONESUR NEGATIVE 02/24/2022 0630   PROTEINUR 100 (A) 02/24/2022 0630   NITRITE NEGATIVE 02/24/2022  0630   LEUKOCYTESUR NEGATIVE 02/24/2022 0630   Radiological Exams on Admission: CT Head Wo Contrast Result Date: 04/14/2024 CLINICAL DATA:  Sudden onset severe headache EXAM: CT HEAD WITHOUT CONTRAST TECHNIQUE: Contiguous axial images were obtained from the base of the skull through the vertex without intravenous contrast. RADIATION DOSE REDUCTION: This exam was performed according to the departmental dose-optimization program which includes automated exposure control, adjustment of the mA and/or kV according to patient size and/or use of iterative reconstruction technique. COMPARISON:  02/24/2022 FINDINGS: Brain: No acute infarct or hemorrhage. Lateral ventricles and midline structures are unremarkable. No acute extra-axial fluid collections. No mass effect. Vascular: No hyperdense vessel or unexpected calcification. Skull: Normal. Negative for fracture or focal lesion. Sinuses/Orbits: Polypoid mucosal thickening versus mucous retention cyst left sphenoid sinus, stable. Remaining paranasal sinuses are clear. Other: None. IMPRESSION: 1. No acute intracranial process. Electronically Signed   By: Ozell Daring M.D.   On: 04/14/2024 16:42   DG Chest 2 View Result Date: 04/14/2024 EXAM: 2 VIEW(S) XRAY OF THE CHEST 04/14/2024 11:40:00 AM COMPARISON: 01/08/2024 CLINICAL HISTORY: CP CP FINDINGS: LUNGS AND PLEURA: No focal pulmonary opacity. No pulmonary edema. No pleural effusion. No pneumothorax. HEART AND MEDIASTINUM: Stable cardiomediastinal silhouette compared to the prior study of 01/08/2024. BONES AND SOFT TISSUES: No acute osseous abnormality. IMPRESSION: 1. No acute cardiopulmonary process. Electronically signed by: Lynwood Seip MD 04/14/2024 11:44 AM EDT RP Workstation: [id number]   Data Reviewed: Relevant notes from primary care and specialist visits, past discharge summaries as available in EHR, including Care Everywhere . Prior diagnostic testing as pertinent to current admission diagnoses,  Updated  medications and problem lists for reconciliation .ED course, including vitals, labs, imaging, treatment and response to treatment,Triage notes, nursing and pharmacy notes and ED provider's notes.Notable results as noted in HPI.Discussed case with EDMD/ ED APP/ or Specialty MD on call and as needed.  Assessment & Plan  >> Shortness of breath/Acute on chronic HFrEF: Will continue patient on his home torsemide , monitor strict I's and O's. Daily weights, renal diet fluid restriction and cardiac diet. Monitoring of kidney function.  Currently stable on room air but clinically does have rales and lower extremity edema. 2D echocardiogram July 2025 shows: 1. Left ventricular ejection fraction, by estimation, is 30 to 35%. The  left ventricle has moderately decreased function. The left ventricle  demonstrates regional wall motion abnormalities (see scoring  diagram/findings for description). The left ventricular internal cavity size was moderately dilated. There is moderate  asymmetric left ventricular hypertrophy of the infero-lateral segment. Left ventricular diastolic parameters are indeterminate.   2. Right ventricular systolic function is normal. The right ventricular  size is normal. Tricuspid regurgitation signal is inadequate for assessing  PA pressure.   3. Left atrial size was mildly dilated.   4. A small pericardial effusion is present. There is no evidence of  cardiac tamponade.   5. The mitral valve is grossly normal. Mild mitral valve regurgitation.  No evidence of mitral stenosis.   6. The aortic valve is tricuspid. Aortic valve regurgitation is trivial.  No aortic stenosis is present.   7. The inferior vena cava is dilated in size with <50% respiratory  variability, suggesting right atrial pressure of 15 mmHg.   >> Essential hypertension:  Vitals:   04/14/24 1058 04/14/24 1245 04/14/24 1315 04/14/24 1545  BP: (!) 163/72 (!) 172/81 (!) 165/74 (!) 188/94   04/14/24 1645 04/14/24  1747 04/14/24 1837  BP: (!) 184/106 (!) 190/98 (!) 164/75  Will continue patient on hydralazine , Coreg , Imdur  as needed hydralazine  as well.  >> CAD: Troponins negative, EKG nonischemic, patient does have a history of CAD, will continue patient on atorvastatin , hold aspirin  due to his anemia and resume once H&H is stable.  Continue patient on Coreg , Zetia  and atorvastatin .  >> Headache: Suspect from combination of blood pressure head CT is negative today. No speech or vision changes.  No weakness and exam is nonfocal.   >> Acute kidney injury on CKD stage V: Follow creatinine and eGFR closely along with electrolytes and blood pressure.  Request nephrology consult. Lab Results  Component Value Date   CREATININE 7.28 (H) 04/14/2024   CREATININE 6.64 (H) 01/11/2024   CREATININE 6.30 (H) 01/10/2024    >>Anemia of CKD:    Latest Ref Rng & Units 04/14/2024   11:10 AM 01/11/2024    4:39 AM 01/10/2024    5:22 AM  CBC  WBC 4.0 - 10.5 K/uL 5.7  8.4  7.4   Hemoglobin 13.0 - 17.0 g/dL 7.0  9.0  9.0   Hematocrit 39.0 - 52.0 % 22.8  27.9  28.2   Platelets 150 - 400 K/uL 126  150  156   We will transfuse one unit.  Stool occult.    DVT prophylaxis:  Scd's.  Consults:  Nephrology.   Advance Care Planning:    Code Status: Full Code   Family Communication:  Wife at bedside.   Disposition Plan:  Home.   Severity of Illness: The appropriate patient status for this patient is INPATIENT. Inpatient status is judged to be reasonable and necessary in order  to provide the required intensity of service to ensure the patient's safety. The patient's presenting symptoms, physical exam findings, and initial radiographic and laboratory data in the context of their chronic comorbidities is felt to place them at high risk for further clinical deterioration. Furthermore, it is not anticipated that the patient will be medically stable for discharge from the hospital within 2 midnights of admission.    * I certify that at the point of admission it is my clinical judgment that the patient will require inpatient hospital care spanning beyond 2 midnights from the point of admission due to high intensity of service, high risk for further deterioration and high frequency of surveillance required.*  Unresulted Labs (From admission, onward)     Start     Ordered   04/15/24 0500  CBC  Tomorrow morning,   R        04/14/24 1555   04/15/24 0500  Basic metabolic panel  Tomorrow morning,   R        04/14/24 1555   04/15/24 0500  Occult blood card to lab, stool  Daily,   R      04/14/24 1712   04/14/24 1736  Creatinine, urine, random  Once,   R        04/14/24 1736   04/14/24 1736  Urinalysis, Routine w reflex microscopic -Urine, Unspecified Source  Once,   R       Question:  Specimen Source  Answer:  Urine, Unspecified Source   04/14/24 1736   04/14/24 1736  Sodium, urine, random  Once,   R        04/14/24 1736            Meds ordered this encounter  Medications   acetaminophen  (TYLENOL ) tablet 1,000 mg   sodium chloride  flush (NS) 0.9 % injection 3 mL   OR Linked Order Group    acetaminophen  (TYLENOL ) tablet 650 mg    acetaminophen  (TYLENOL ) suppository 650 mg   bisacodyl (DULCOLAX) EC tablet 5 mg   OR Linked Order Group    ondansetron  (ZOFRAN ) tablet 4 mg    ondansetron  (ZOFRAN ) injection 4 mg   hydrALAZINE  (APRESOLINE ) injection 5 mg   atorvastatin  (LIPITOR ) tablet 80 mg   DISCONTD: aspirin  EC tablet 81 mg   carvedilol  (COREG ) tablet 25 mg   hydrALAZINE  (APRESOLINE ) tablet 50 mg   nitroGLYCERIN  (NITROSTAT ) SL tablet 0.4 mg   isosorbide  dinitrate (ISORDIL ) tablet 20 mg   ezetimibe  (ZETIA ) tablet 10 mg   0.9 %  sodium chloride  infusion (Manually program via Guardrails IV Fluids)   pantoprazole  (PROTONIX ) injection 40 mg   DISCONTD: torsemide  (DEMADEX ) tablet 20 mg   torsemide  (DEMADEX ) tablet 20 mg     Orders Placed This Encounter  Procedures   Critical Care   Resp  panel by RT-PCR (RSV, Flu A&B, Covid) Anterior Nasal Swab   DG Chest 2 View   CT Head Wo Contrast   US  RENAL   Basic metabolic panel   CBC   Hepatic function panel   Magnesium   Brain natriuretic peptide   CBC   Basic metabolic panel   Occult blood card to lab, stool   Creatinine, urine, random   Urinalysis, Routine w reflex microscopic -Urine, Unspecified Source   Sodium, urine, random   Diet renal/carb modified with fluid restriction Diet-HS Snack? Nothing; Fluid restriction: 1200 mL Fluid; Room service appropriate? Yes; Fluid consistency: Thin   Document Height and Actual Weight   ED Cardiac monitoring  Maintain IV access   Vital signs   Notify physician (specify)   Mobility Protocol: No Restrictions   Refer to Sidebar Report Mobility Protocol for Adult Inpatient   Initiate Adult Central Line Maintenance and Catheter Clearance Protocol for patients with central line (CVC, PICC, Port, Hemodialysis, Trialysis)   If patient diabetic or glucose greater than 140 notify physician for Sliding Scale Insulin  Orders   Initiate CHG Protocol for patients in ICU/SD or any patient with a central line or foley catheter   Do not place and if present remove PureWick   Initiate Oral Care Protocol   Initiate Carrier Fluid Protocol   RN may order General Admission PRN Orders utilizing General Admission PRN medications (through manage orders) for the following patient needs: allergy symptoms (Claritin), cold sores (Carmex), cough (Robitussin DM), eye irritation (Liquifilm Tears), hemorrhoids (Tucks), indigestion (Maalox), minor skin irritation (Hydrocortisone Cream), muscle pain Lucienne Gay), nose irritation (saline nasal spray) and sore throat (Chloraseptic spray).   Cardiac Monitoring - Continuous Indefinite   Daily weights   Strict intake and output   Informed Consent Details: Physician/Practitioner Attestation; Transcribe to consent form and obtain patient signature   CBC post transfusion - RN to  place lab order with appropriate draw time   Full code   Consult to hospitalist  Symptomatic anemia, CHF   Nutritional services consult   PT eval and treat   Pulse oximetry check with vital signs   Oxygen therapy Mode or (Route): Nasal cannula; Liters Per Minute: 2; Keep O2 saturation between: greater than 92 %   Incentive spirometry   ED EKG   EKG 12-Lead   Type and screen [facility]   Prepare RBC (crossmatch)   Admit to Inpatient (patient's expected length of stay will be greater than 2 midnights or inpatient only procedure)   Aspiration precautions   Fall precautions    Author: Mario LULLA Blanch, MD 12 pm- 8 pm. [facility]. 04/14/2024 7:05 PM Please note for any communication after hours contact [facility] Assigned provider on call on Amion.

## 2024-04-14 NOTE — ED Provider Notes (Signed)
 [place] EMERGENCY DEPARTMENT AT [facility] Provider Note   CSN: [id number] Arrival date & time: 04/14/24  1053     Patient presents with: Chest Pain, Dizziness, and Nausea   [name] is a 38 y.o. male with PMHx HTN, DM, CAD s/p stent in 2021, CKD stage V who is compliant with all of his medications who presents to ED concern for central chest tightness, SOB lightheadedness, nausea, fatigue, and mild frontal headaches.  Patient also endorsing BL LE swelling.  The symptoms have been progressing over the past 3 to 4 days.  Patient also endorsing rhinorrhea and cough a couple days ago but has been healing well from this.  Headaches are resolving with OTC migraine medications.  Denies head trauma.  Patient's dyspnea/chest tightness is not associated with rest vs exertion vs positioning.  Denies fever, vomiting, diarrhea, dysuria, hematuria, hematochezia. Denies tob use.   Patient stated that he is supposed to be receiving iron  infusions.  Patient has not received one since July because he went away on vacation and did not get reestablished once he returned.  Patient denies hematochezia - endorses normal light brown stools.      Chest Pain Associated symptoms: dizziness   Dizziness Associated symptoms: chest pain        Prior to Admission medications   Medication Sig Start Date End Date Taking? Authorizing Provider  aspirin  EC 81 MG tablet Take 81 mg by mouth daily. Swallow whole.    [provider]  atorvastatin  (LIPITOR ) 80 MG tablet TAKE 1 TABLET BY MOUTH EVERY DAY 03/20/24   Lelon Hamilton T, PA-C  carvedilol  (COREG ) 25 MG tablet TAKE 1 TABLET BY MOUTH TWICE A DAY IN THE MORNING AND IN THE EVENING 04/28/23   Chandrasekhar, Mahesh A, MD  ezetimibe  (ZETIA ) 10 MG tablet TAKE 1 TABLET BY MOUTH EVERY DAY 03/20/24   Lelon, Scott T, PA-C  hydrALAZINE  (APRESOLINE ) 50 MG tablet TAKE 1 & 1/2 TABLETS BY MOUTH 3 TIMES DAILY 12/13/23   Chandrasekhar, Mahesh A, MD   isosorbide  dinitrate (ISORDIL ) 20 MG tablet TAKE 1 TABLET BY MOUTH THREE TIMES A DAY 05/06/23   Chandrasekhar, Mahesh A, MD  nitroGLYCERIN  (NITROSTAT ) 0.4 MG SL tablet Place 1 tablet (0.4 mg total) under the tongue every 5 (five) minutes as needed for chest pain. 02/02/21 01/08/24  Dann Candyce RAMAN, MD  torsemide  (DEMADEX ) 20 MG tablet Take 2 tablets (40 mg total) by mouth 2 (two) times daily. 01/11/24   Ghimire, Donalda HERO, MD    Allergies: Patient has no known allergies.    Review of Systems  Cardiovascular:  Positive for chest pain.  Neurological:  Positive for dizziness.    Updated Vital Signs BP (!) 172/81   Pulse 75   Temp 98.1 F (36.7 C)   Resp 18   Ht 6' 4 (1.93 m)   Wt 124.7 kg   SpO2 97%   BMI 33.47 kg/m   Physical Exam Vitals and nursing note reviewed.  Constitutional:      General: He is not in acute distress.    Appearance: He is not ill-appearing or toxic-appearing.  HENT:     Head: Normocephalic and atraumatic.     Mouth/Throat:     Mouth: Mucous membranes are moist.     Pharynx: No oropharyngeal exudate or posterior oropharyngeal erythema.  Eyes:     General: No scleral icterus.       Right eye: No discharge.        Left  eye: No discharge.     Conjunctiva/sclera: Conjunctivae normal.  Cardiovascular:     Rate and Rhythm: Normal rate and regular rhythm.     Pulses: Normal pulses.     Heart sounds: Normal heart sounds. No murmur heard. Pulmonary:     Effort: Pulmonary effort is normal. No respiratory distress.     Breath sounds: Normal breath sounds. No wheezing, rhonchi or rales.  Abdominal:     Tenderness: There is no abdominal tenderness.  Musculoskeletal:     Right lower leg: No edema.     Left lower leg: No edema.     Comments: No obvious leg swelling appreciated.  Skin:    General: Skin is warm and dry.     Findings: No rash.  Neurological:     General: No focal deficit present.     Mental Status: He is alert and oriented to person,  place, and time. Mental status is at baseline.     Comments: GCS 15. Speech is goal oriented. No deficits appreciated to CN III-XI. Patient moves extremities without ataxia. Patient ambulatory with steady gait.   Psychiatric:        Mood and Affect: Mood normal.        Behavior: Behavior normal.     (all labs ordered are listed, but only abnormal results are displayed) Labs Reviewed  BASIC METABOLIC PANEL WITH GFR - Abnormal; Notable for the following components:      Result Value   CO2 19 (*)    BUN 54 (*)    Creatinine, Ser 7.28 (*)    Calcium  7.9 (*)    GFR, Estimated 9 (*)    All other components within normal limits  CBC - Abnormal; Notable for the following components:   RBC 2.35 (*)    Hemoglobin 7.0 (*)    HCT 22.8 (*)    Platelets 126 (*)    All other components within normal limits  BRAIN NATRIURETIC PEPTIDE - Abnormal; Notable for the following components:   B Natriuretic Peptide 701.5 (*)    All other components within normal limits  TROPONIN I (HIGH SENSITIVITY) - Abnormal; Notable for the following components:   Troponin I (High Sensitivity) 18 (*)    All other components within normal limits  RESP PANEL BY RT-PCR (RSV, FLU A&B, COVID)  [id number]  HEPATIC FUNCTION PANEL  MAGNESIUM  TROPONIN I (HIGH SENSITIVITY)    EKG: None  Radiology: DG Chest 2 View Result Date: 04/14/2024 EXAM: 2 VIEW(S) XRAY OF THE CHEST 04/14/2024 11:40:00 AM COMPARISON: 01/08/2024 CLINICAL HISTORY: CP CP FINDINGS: LUNGS AND PLEURA: No focal pulmonary opacity. No pulmonary edema. No pleural effusion. No pneumothorax. HEART AND MEDIASTINUM: Stable cardiomediastinal silhouette compared to the prior study of 01/08/2024. BONES AND SOFT TISSUES: No acute osseous abnormality. IMPRESSION: 1. No acute cardiopulmonary process. Electronically signed by: Lynwood Seip MD 04/14/2024 11:44 AM EDT RP Workstation: [id number]     .Critical Care  Performed by: Hoy Nidia FALCON, PA-C Authorized by:  Hoy Nidia FALCON, PA-C   Critical care provider statement:    Critical care time (minutes):  30   Critical care was necessary to treat or prevent imminent or life-threatening deterioration of the following conditions: CHF, symptomatic anemia.   Critical care was time spent personally by me on the following activities:  Development of treatment plan with patient or surrogate, discussions with consultants, evaluation of patient's response to treatment, examination of patient, ordering and review of laboratory studies, ordering and review of radiographic studies,  ordering and performing treatments and interventions, pulse oximetry, re-evaluation of patient's condition and review of old charts   Care discussed with: admitting provider      Medications Ordered in the ED  acetaminophen  (TYLENOL ) tablet 1,000 mg (1,000 mg Oral Given 04/14/24 1430)                                    Medical Decision Making Amount and/or Complexity of Data Reviewed Labs: ordered. Radiology: ordered.   This patient presents to the ED for concern of chest pain, this involves an extensive number of treatment options, and is a complaint that carries with it a high risk of complications and morbidity.  The differential diagnosis includes acute coronary syndrome, congestive heart failure, pericarditis, pneumonia, pulmonary embolism, tension pneumothorax, esophageal rupture, aortic dissection, cardiac tamponade, musculoskeletal   Co morbidities that complicate the patient evaluation  HTN, DM, CAD s/p stent in 2021, CKD stage V   Additional history obtained:  Patient with similar presentation 12/2023 requiring hospital admission.   Problem List / ED Course / Critical interventions / Medication management  Patient presented for central chest tightness, SOB, lightheadedness, nausea. Also with BL LE swelling x3-4 days.  Symptoms are similar to prior admission in 12/2023.  Physical exam reassuring.  Patient afebrile  with stable vitals. I Ordered, and personally interpreted labs.  Initial troponin 18.  BNP elevated at 701.5  (patient does not appear volume overloaded on exam).  CBC without leukocytosis, but there is anemia with hemoglobin around 7.0 which is down from his baseline around 9.  BMP with slight elevation from baseline BUN/creatinine at 5/7.27 today.  Respiratory panel negative.  Hepatic function panel and magnesium are pending. the patient was maintained on a cardiac monitor.  I personally viewed and interpreted the EKG/cardiac monitored which showed an underlying rhythm of: Sinus rhythm. I ordered imaging studies including chest xray to assess for process contributing to patient's symptoms. I independently visualized and interpreted imaging which showed no acute cardiopulmonary disease. I agree with the radiologist interpretation. I feel that patient would greatly benefit from admission for blood transfusion and to start his iron  infusions again.  Patient agreeable to plan. I requested consultation with the hospitalist on-call Dr. Tobie,  and discussed lab and imaging findings as well as pertinent plan - they agree to admit patient. I have reviewed the patients home medicines and have made adjustments as needed   Social Determinants of Health:  none      Final diagnoses:  Symptomatic anemia  Acute on chronic congestive heart failure, unspecified heart failure type [facility])    ED Discharge Orders     None          Hoy Nidia FALCON, [place] 04/14/24 1432    Levander Houston, MD 04/17/24 1339

## 2024-04-14 NOTE — ED Triage Notes (Signed)
 Pt came in via POV d/t generalized CP, dizziness, nausea & a slight HA with a pulsing feeling in bil ears since yesterday. A/Ox4, rates his pain 4/10 during triage.

## 2024-04-15 DIAGNOSIS — R0602 Shortness of breath: Secondary | ICD-10-CM | POA: Diagnosis not present

## 2024-04-15 LAB — IRON AND TIBC
Iron: 76 ug/dL (ref 45–182)
Saturation Ratios: 32 % (ref 17.9–39.5)
TIBC: 237 ug/dL — ABNORMAL LOW (ref 250–450)
UIBC: 161 ug/dL

## 2024-04-15 LAB — CBC
HCT: 26 % — ABNORMAL LOW (ref 39.0–52.0)
Hemoglobin: 8.2 g/dL — ABNORMAL LOW (ref 13.0–17.0)
MCH: 30.3 pg (ref 26.0–34.0)
MCHC: 31.5 g/dL (ref 30.0–36.0)
MCV: 95.9 fL (ref 80.0–100.0)
Platelets: 120 K/uL — ABNORMAL LOW (ref 150–400)
RBC: 2.71 MIL/uL — ABNORMAL LOW (ref 4.22–5.81)
RDW: 14 % (ref 11.5–15.5)
WBC: 5.7 K/uL (ref 4.0–10.5)
nRBC: 0 % (ref 0.0–0.2)

## 2024-04-15 LAB — BASIC METABOLIC PANEL WITH GFR
Anion gap: 12 (ref 5–15)
BUN: 53 mg/dL — ABNORMAL HIGH (ref 6–20)
CO2: 19 mmol/L — ABNORMAL LOW (ref 22–32)
Calcium: 7.9 mg/dL — ABNORMAL LOW (ref 8.9–10.3)
Chloride: 109 mmol/L (ref 98–111)
Creatinine, Ser: 7.34 mg/dL — ABNORMAL HIGH (ref 0.61–1.24)
GFR, Estimated: 9 mL/min — ABNORMAL LOW (ref >60)
Glucose, Bld: 78 mg/dL (ref 70–99)
Potassium: 3.6 mmol/L (ref 3.5–5.1)
Sodium: 140 mmol/L (ref 135–145)

## 2024-04-15 LAB — FOLATE: Folate: 9.6 ng/mL (ref >5.9)

## 2024-04-15 LAB — FERRITIN: Ferritin: 131 ng/mL (ref 24–336)

## 2024-04-15 LAB — VITAMIN B12: Vitamin B-12: 275 pg/mL (ref 180–914)

## 2024-04-15 MED ORDER — HYDRALAZINE HCL 50 MG PO TABS
100.0000 mg | ORAL_TABLET | Freq: Three times a day (TID) | ORAL | Status: DC
  Administered 2024-04-15 – 2024-04-16 (×3): 100 mg via ORAL
  Filled 2024-04-15 (×3): qty 2

## 2024-04-15 NOTE — Progress Notes (Signed)
 [facility] Progress Note  Subjective:  UOP 1.5 L yest Feeling good today Creat stable at 7.3  Vitals:   04/15/24 0315 04/15/24 0317 04/15/24 0335 04/15/24 0745  BP: (!) 175/92   (!) 186/85  Pulse: 72 73  83  Resp: 18   19  Temp: 98.6 F (37 C)   98.3 F (36.8 C)  TempSrc: Oral   Oral  SpO2: 99% 99%  100%  Weight:   124.3 kg   Height:        Exam: Gen alert, no distress Sclera anicteric, throat clear  No jvd, flat neck veins Chest clear bilat to bases RRR no MRG Abd soft ntnd no mass or ascites +bs Ext trace ankle edema bilat, no other edema Neuro is alert, Ox 3 , nf     Home bp meds: Coreg  25 bid Hydralazine  75mg  tid Isordil  20mg  tid Demadex  40mg  bid   Date                             Creat               eGFR (ml/min) 2012- 2016                  0.80- 1.14 2018- 2019                  1.10- 1.51         2020                            1.24- 2.64 Feb 2021                     1.31 Jan 2020                     2.00- 7.44 Oct 2021                     2.30 May 2020                     1.46 Jan- nov 2022             1.70- 2.84 2023                            2.48- 4.57 Oct 2024                     5.23 September 2023                    6.63 July 2025                     6.30- 6.64        10- 11 ml/min 04/14/24                      7.28                 9 ml/min                         UA: pend UNa , UCr pend Renal US : 10.6/ 12.2 cm kidneys w/o hydronephrosis CXR 11/01: no active disease   Assessment/ Plan: AKI on CKD 5: b/l creatinine is 6.3- 6.6,  from July 2025, eGFR 10-11 ml/min. Pt is f/b Dr Macel from [facility]. Creat is 7.2 here in the setting of fatigue, loss of energy. On exam pt appears euvolemic and initial CXR was clear. AKI is likely progression of CKD 5. Creat today 7.3, unchanged. UOP is good. No sig uremic signs and no need for acute HD. K+ wnl. Will follow.  Anemia: due to renal failure in part. S/P 1u prbc's 11/01, Hb up to 8.2. Tsat 32%  and ferritin 131 on labs from 11/2, no need for IV Fe.  HTN: getting home bp meds here, coreg  and hydralazine , per pmd. BP's slightly high.  Volume: looks euvolemic and we are continuing home torsemide  at 20mg  bid.    Myer Fret MD  [facility] 04/15/2024, 11:31 AM  Recent Labs  Lab 04/14/24 1110 04/14/24 1305 04/15/24 0741  HGB 7.0*  --  8.2*  ALBUMIN  --  3.2*  --   CALCIUM  7.9*  --  7.9*  CREATININE 7.28*  --  7.34*  K 3.5  --  3.6   Recent Labs  Lab 04/15/24 0741  IRON  76  TIBC 237*  FERRITIN 131   Inpatient medications:  atorvastatin   80 mg Oral Daily   carvedilol   25 mg Oral BID WC   ezetimibe   10 mg Oral Daily   hydrALAZINE   50 mg Oral Q8H   isosorbide  dinitrate  20 mg Oral TID   pantoprazole  (PROTONIX ) IV  40 mg Intravenous Q12H   sodium chloride  flush  3 mL Intravenous Q12H   torsemide   20 mg Oral BID    acetaminophen  **OR** acetaminophen , bisacodyl, hydrALAZINE , nitroGLYCERIN , ondansetron  **OR** ondansetron  (ZOFRAN ) IV

## 2024-04-15 NOTE — Progress Notes (Signed)
 PROGRESS NOTE    [name]  FMW:[id number] DOB: Nov 06, 1985 DOA: 04/14/2024 PCP: Marvene Prentice SAUNDERS, FNP  Outpatient Specialists:     Brief Narrative:  Patient is a 38 year old male with past medical history significant for chronic kidney disease stage IIIb, coronary artery disease, hypertension, diabetes mellitus, hyperlipidemia, anemia, heart failure with reduced EF and myocardial infarction.  Patient is followed up by the local nephrology team.  Patient was admitted with symptomatic anemia, shortness of breath, possible acute on chronic heart failure with reduced EF and hypertensive urgency.  04/15/2024: Patient seen.  Patient has been transfused with packed red blood cells.  Shortness of breath is improving.  However, blood pressure remains significantly elevated.  Will optimize blood pressure and discharge patient.  Nephrology team does not plan on initiating renal replacement therapy at this time.   Assessment & Plan:   Principal Problem:   SOB (shortness of breath)   Symptomatic anemia: - Patient presented with shortness of breath. - Hemoglobin of 7 g/dL on presentation (down from 9 g/dL). - S/p transfusion of 1 unit of packed red blood cells. - Shortness of breath has improved. - Check B12 and folate. - Anemia is likely multifactorial.  Hypertensive urgency: - Systolic blood blood pressure in the 180s. - Increase hydralazine  from 50 mg p.o. 3 times daily to 100 mg p.o. 3 times daily. - Continue to monitor renal function and electrolytes.  Possible acute on chronic heart failure with reduced EF: - Last echo revealed EF of 30 to 35%. - Patient is currently on torsemide  20 mg p.o. twice daily.  Will increase torsemide  to 40 mg p.o. twice daily. - Continue isosorbide  dinitrate and hydralazine . - Strict I's and O's.  Coronary artery disease: - Negative troponin.  Chronic kidney disease stage V: - Patient follows up with Dr. Dwane. - According to the patient, he  plans to proceed with peritoneal dialysis. - Nephrology team is managing.   DVT prophylaxis:  Code Status: Full code Family Communication: Wife was on the phone Disposition Plan: Home eventually   Consultants:  Nephrology  Procedures:  Echo done on 01/09/2024 revealed: 1. Left ventricular ejection fraction, by estimation, is 30 to 35%. The  left ventricle has moderately decreased function. The left ventricle  demonstrates regional wall motion abnormalities (see scoring  diagram/findings for description). The left  ventricular internal cavity size was moderately dilated. There is moderate  asymmetric left ventricular hypertrophy of the infero-lateral segment.  Left ventricular diastolic parameters are indeterminate.   2. Right ventricular systolic function is normal. The right ventricular  size is normal. Tricuspid regurgitation signal is inadequate for assessing  PA pressure.   3. Left atrial size was mildly dilated.   4. A small pericardial effusion is present. There is no evidence of  cardiac tamponade.   5. The mitral valve is grossly normal. Mild mitral valve regurgitation.  No evidence of mitral stenosis.   6. The aortic valve is tricuspid. Aortic valve regurgitation is trivial.  No aortic stenosis is present.   7. The inferior vena cava is dilated in size with <50% respiratory  variability, suggesting right atrial pressure of 15 mmHg.    Antimicrobials:  None.   Subjective: No new complaints  Objective: Vitals:   04/15/24 0315 04/15/24 0317 04/15/24 0335 04/15/24 0745  BP: (!) 175/92   (!) 186/85  Pulse: 72 73  83  Resp: 18   19  Temp: 98.6 F (37 C)   98.3 F (36.8 C)  TempSrc: Oral  Oral  SpO2: 99% 99%  100%  Weight:   124.3 kg   Height:        Intake/Output Summary (Last 24 hours) at 04/15/2024 1058 Last data filed at 04/15/2024 0317 Gross per 24 hour  Intake 512.67 ml  Output 1550 ml  Net -1037.33 ml   Filed Weights   04/14/24 1102 04/14/24  1837 04/15/24 0335  Weight: 124.7 kg 124.7 kg 124.3 kg    Examination:  General exam: Appears calm and comfortable.  Patient is obese. Respiratory system: Clear to auscultation. Respiratory effort normal. Cardiovascular system: S1 & S2, systolic murmur, increased intensity of S2 component of the heart sound.   Gastrointestinal system: Abdomen is obese, soft and nontender.  Central nervous system: Alert and oriented. No focal neurological deficits. Extremities: Minimal to mild ankle edema, fullness of the ankle.  Data Reviewed: I have personally reviewed following labs and imaging studies  CBC: Recent Labs  Lab 04/14/24 1110 04/15/24 0741  WBC 5.7 5.7  HGB 7.0* 8.2*  HCT 22.8* 26.0*  MCV 97.0 95.9  PLT 126* 120*   Basic Metabolic Panel: Recent Labs  Lab 04/14/24 1110 04/14/24 1305 04/15/24 0741  NA 140  --  140  K 3.5  --  3.6  CL 107  --  109  CO2 19*  --  19*  GLUCOSE 98  --  78  BUN 54*  --  53*  CREATININE 7.28*  --  7.34*  CALCIUM  7.9*  --  7.9*  MG  --  2.2  --    GFR: Estimated Creatinine Clearance: 19.3 mL/min (A) (by C-G formula based on SCr of 7.34 mg/dL (H)). Liver Function Tests: Recent Labs  Lab 04/14/24 1305  AST 18  ALT 20  ALKPHOS 68  BILITOT 1.4*  PROT 6.2*  ALBUMIN 3.2*   No results for input(s): LIPASE, AMYLASE in the last 168 hours. No results for input(s): AMMONIA in the last 168 hours. Coagulation Profile: No results for input(s): INR, PROTIME in the last 168 hours. Cardiac Enzymes: No results for input(s): CKTOTAL, CKMB, CKMBINDEX, TROPONINI in the last 168 hours. BNP (last 3 results) No results for input(s): PROBNP in the last 8760 hours. HbA1C: No results for input(s): HGBA1C in the last 72 hours. CBG: Recent Labs  Lab 04/14/24 2111  GLUCAP 92   Lipid Profile: No results for input(s): CHOL, HDL, LDLCALC, TRIG, CHOLHDL, LDLDIRECT in the last 72 hours. Thyroid Function Tests: No results  for input(s): TSH, T4TOTAL, FREET4, T3FREE, THYROIDAB in the last 72 hours. Anemia Panel: Recent Labs    04/15/24 0741  FERRITIN 131  TIBC 237*  IRON  76   Urine analysis:    Component Value Date/Time   COLORURINE YELLOW 02/24/2022 0630   APPEARANCEUR CLEAR 02/24/2022 0630   LABSPEC 1.012 02/24/2022 0630   PHURINE 5.0 02/24/2022 0630   GLUCOSEU >=500 (A) 02/24/2022 0630   HGBUR NEGATIVE 02/24/2022 0630   BILIRUBINUR NEGATIVE 02/24/2022 0630   KETONESUR NEGATIVE 02/24/2022 0630   PROTEINUR 100 (A) 02/24/2022 0630   NITRITE NEGATIVE 02/24/2022 0630   LEUKOCYTESUR NEGATIVE 02/24/2022 0630   Sepsis Labs: @LABRCNTIP (procalcitonin:4,lacticidven:4)  ) Recent Results (from the past 240 hours)  Resp panel by RT-PCR (RSV, Flu A&B, Covid) Anterior Nasal Swab     Status: None   Collection Time: 04/14/24 11:49 AM   Specimen: Anterior Nasal Swab  Result Value Ref Range Status   SARS Coronavirus 2 by RT PCR NEGATIVE NEGATIVE Final   Influenza A by PCR NEGATIVE  NEGATIVE Final   Influenza B by PCR NEGATIVE NEGATIVE Final    Comment: (NOTE) The Xpert Xpress SARS-CoV-2/FLU/RSV plus assay is intended as an aid in the diagnosis of influenza from Nasopharyngeal swab specimens and should not be used as a sole basis for treatment. Nasal washings and aspirates are unacceptable for Xpert Xpress SARS-CoV-2/FLU/RSV testing.  Fact Sheet for Patients: bloggercourse.com  Fact Sheet for Healthcare Providers: seriousbroker.it  This test is not yet approved or cleared by the [place]  FDA and has been authorized for detection and/or diagnosis of SARS-CoV-2 by FDA under an Emergency Use Authorization (EUA). This EUA will remain in effect (meaning this test can be used) for the duration of the COVID-19 declaration under Section 564(b)(1) of the Act, 21 U.S.C. section 360bbb-3(b)(1), unless the authorization is terminated or revoked.      Resp Syncytial Virus by PCR NEGATIVE NEGATIVE Final    Comment: (NOTE) Fact Sheet for Patients: bloggercourse.com  Fact Sheet for Healthcare Providers: seriousbroker.it  This test is not yet approved or cleared by the [place]  FDA and has been authorized for detection and/or diagnosis of SARS-CoV-2 by FDA under an Emergency Use Authorization (EUA). This EUA will remain in effect (meaning this test can be used) for the duration of the COVID-19 declaration under Section 564(b)(1) of the Act, 21 U.S.C. section 360bbb-3(b)(1), unless the authorization is terminated or revoked.  Performed at [facility], [address] [place], [place] [address]          Radiology Studies: US  RENAL Result Date: 04/14/2024 EXAM: US  Retroperitoneum Complete, Renal. CLINICAL HISTORY: [id number] Acute renal failure superimposed on stage 5 chronic kidney disease, not on chronic dialysis (HCC). TECHNIQUE: Real-time ultrasound of the retroperitoneum (complete) with image documentation. COMPARISON: US  Renal 05/05/2021. FINDINGS: RIGHT KIDNEY: Right Kidney measures 10.6 cm in length. No hydronephrosis, renal stone, or mass visualized. LEFT KIDNEY: Left Kidney measures 12.2 cm in length. No hydronephrosis, renal stone, or mass visualized. BLADDER: Unremarkable as visualized. IMPRESSION: 1. No hydronephrosis. Electronically signed by: Franky Crease MD 04/14/2024 11:35 PM EDT RP Workstation: [id number]   CT Head Wo Contrast Result Date: 04/14/2024 CLINICAL DATA:  Sudden onset severe headache EXAM: CT HEAD WITHOUT CONTRAST TECHNIQUE: Contiguous axial images were obtained from the base of the skull through the vertex without intravenous contrast. RADIATION DOSE REDUCTION: This exam was performed according to the departmental dose-optimization program which includes automated exposure control, adjustment of the mA and/or kV according to patient size and/or use of  iterative reconstruction technique. COMPARISON:  02/24/2022 FINDINGS: Brain: No acute infarct or hemorrhage. Lateral ventricles and midline structures are unremarkable. No acute extra-axial fluid collections. No mass effect. Vascular: No hyperdense vessel or unexpected calcification. Skull: Normal. Negative for fracture or focal lesion. Sinuses/Orbits: Polypoid mucosal thickening versus mucous retention cyst left sphenoid sinus, stable. Remaining paranasal sinuses are clear. Other: None. IMPRESSION: 1. No acute intracranial process. Electronically Signed   By: Ozell Daring M.D.   On: 04/14/2024 16:42   DG Chest 2 View Result Date: 04/14/2024 EXAM: 2 VIEW(S) XRAY OF THE CHEST 04/14/2024 11:40:00 AM COMPARISON: 01/08/2024 CLINICAL HISTORY: CP CP FINDINGS: LUNGS AND PLEURA: No focal pulmonary opacity. No pulmonary edema. No pleural effusion. No pneumothorax. HEART AND MEDIASTINUM: Stable cardiomediastinal silhouette compared to the prior study of 01/08/2024. BONES AND SOFT TISSUES: No acute osseous abnormality. IMPRESSION: 1. No acute cardiopulmonary process. Electronically signed by: Lynwood Seip MD 04/14/2024 11:44 AM EDT RP Workstation: [id number]        Scheduled  Meds:  atorvastatin   80 mg Oral Daily   carvedilol   25 mg Oral BID WC   ezetimibe   10 mg Oral Daily   hydrALAZINE   50 mg Oral Q8H   isosorbide  dinitrate  20 mg Oral TID   pantoprazole  (PROTONIX ) IV  40 mg Intravenous Q12H   sodium chloride  flush  3 mL Intravenous Q12H   torsemide   20 mg Oral BID   Continuous Infusions:   LOS: 1 day    Time spent: 55 minutes.    Leatrice Chapel, MD  [facility] Pager #: [phone] 7PM-7AM contact night coverage as above

## 2024-04-15 NOTE — Evaluation (Signed)
 Physical Therapy Brief Evaluation and Discharge Note Patient Details Name: [name] MRN: [id number] DOB: April 13, 1986 Today's Date: 04/15/2024   History of Present Illness  38 yo M adm 04/14/24 with SOB, chest pain, AKI, acute on chronic CHF. 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 Educated for daily weight, walking program and heart healthy diet with pt stating understanding. Pt without further therapy needs at this time with pt aware and agreeable, will sign off.        PT Assessment Patient does not need any further PT services  Assistance Needed at Discharge  None    Equipment Recommendations None recommended by PT  Recommendations for Other Services       Precautions/Restrictions Precautions Precautions: None        Mobility  Bed Mobility       General bed mobility comments: sitting EOB on arrival and end of session  Transfers Overall transfer level: Modified independent                      Ambulation/Gait Ambulation/Gait assistance: Independent Gait Distance (Feet): 600 Feet Assistive device: None Gait Pattern/deviations: WFL(Within Functional Limits) Gait Speed: Pace WFL    Home Activity Instructions    Stairs            Modified Rankin (Stroke Patients Only)        Balance Overall balance assessment: No apparent balance deficits (not formally assessed)                        Pertinent Vitals/Pain PT - Brief Vital Signs All Vital Signs Stable: Yes Pain Assessment Pain Assessment: No/denies pain     Home Living Family/patient expects to be discharged to:: Private residence Living Arrangements: Spouse/significant other Available Help at Discharge: Family Home Environment: Level entry   Home Equipment: None        Prior Function Level of  Independence: Independent      UE/LE Assessment   UE ROM/Strength/Tone/Coordination: WFL    LE ROM/Strength/Tone/Coordination: [facility]      Communication   Communication Communication: No apparent difficulties     Cognition Overall Cognitive Status: Appears within functional limits for tasks assessed/performed       General Comments      Exercises     Assessment/Plan    PT Problem List         PT Visit Diagnosis Other abnormalities of gait and mobility (R26.89)    No Skilled PT Patient at baseline level of functioning;All education completed;Patient is independent with all acitivity/mobility   Co-evaluation                AMPAC 6 Clicks Help needed turning from your back to your side while in a flat bed without using bedrails?: None Help needed moving from lying on your back to sitting on the side of a flat bed without using bedrails?: None Help needed moving to and from a bed to a chair (including a wheelchair)?: None Help needed standing up from a chair using your arms (e.g., wheelchair or bedside chair)?: None Help needed to walk in hospital room?: None Help needed climbing 3-5 steps with a railing? : None 6 Click Score: 24      End of Session   Activity Tolerance: Patient tolerated treatment well Patient left:  in bed;with call bell/phone within reach;with family/visitor present Nurse Communication: Mobility status PT Visit Diagnosis: Other abnormalities of gait and mobility (R26.89)     Time: 8984-8972 PT Time Calculation (min) (ACUTE ONLY): 12 min  Charges:   PT Evaluation $PT Eval Low Complexity: 1 Low      Benton Tooker P, PT Acute Rehabilitation Services Office: [phone]   Lenoard NOVAK Waylon Hershey  04/15/2024, 11:04 AM

## 2024-04-16 DIAGNOSIS — R0602 Shortness of breath: Secondary | ICD-10-CM | POA: Diagnosis not present

## 2024-04-16 LAB — TYPE AND SCREEN
ABO/RH(D): O NEG
Antibody Screen: NEGATIVE
Unit division: 0

## 2024-04-16 LAB — BPAM RBC
Blood Product Expiration Date: 202511102359
ISSUE DATE / TIME: 202511020045
Unit Type and Rh: 202511102359
Unit Type and Rh: 9500

## 2024-04-16 MED ORDER — PANTOPRAZOLE SODIUM 40 MG PO TBEC: 40.0000 mg | DELAYED_RELEASE_TABLET | Freq: Every day | ORAL | 0 refills | Status: AC

## 2024-04-16 MED ORDER — HYDRALAZINE HCL 100 MG PO TABS: 100.0000 mg | ORAL_TABLET | Freq: Three times a day (TID) | ORAL | 1 refills | Status: AC

## 2024-04-16 MED ORDER — VITAMIN B-12 1000 MCG PO TABS: 1000.0000 ug | ORAL_TABLET | Freq: Every day | ORAL | 1 refills | Status: AC

## 2024-04-16 MED ORDER — PANTOPRAZOLE SODIUM 40 MG PO TBEC
40.0000 mg | DELAYED_RELEASE_TABLET | Freq: Two times a day (BID) | ORAL | Status: DC
  Administered 2024-04-16: 40 mg via ORAL
  Filled 2024-04-16: qty 1

## 2024-04-16 NOTE — TOC Transition Note (Signed)
 Transition of Care [facility]) - Discharge Note   Patient Details  Name: [name] MRN: [id number] Date of Birth: [birth year]  [facility]) CM/SW Contact:  Waddell Barnie Rama, RN Phone Number: 04/16/2024, 11:06 AM   Clinical Narrative:    For dc today, wife is at the bedside to transport patient home.         Patient Goals and CMS Choice            Discharge Placement                       Discharge Plan and Services Additional resources added to the After Visit Summary for                                       Social Drivers of Health (SDOH) Interventions SDOH Screenings   Food Insecurity: No Food Insecurity (04/14/2024)  Housing: Low Risk  (04/14/2024)  Transportation Needs: No Transportation Needs (04/14/2024)  Utilities: Not At Risk (04/14/2024)  Tobacco Use: Low Risk  (04/14/2024)     Readmission Risk Interventions    04/16/2024   11:05 AM  Readmission Risk Prevention Plan  Transportation Screening Complete  PCP or Specialist Appt within 5-7 Days Complete  Home Care Screening Complete  Medication Review (RN CM) Complete

## 2024-04-16 NOTE — Progress Notes (Signed)
 Heart Failure Navigator Progress Note  Assessed for Heart & Vascular TOC clinic readiness.  Patient does not meet criteria due to  plans to start PD as outpatient. NO HF TOC.   Navigator will sign off at this time.   Stephane Haddock, BSN, Scientist, Clinical (histocompatibility And Immunogenetics) Only

## 2024-04-16 NOTE — Progress Notes (Signed)
 [facility] Progress Note  Assessment/ Plan: AKI on CKD 5: b/l creatinine is 6.3- 6.6, from July 2025, eGFR 10-11 ml/min. Pt is f/b Dr Macel from [facility]. Creat is 7.2 here in the setting of fatigue, loss of energy. On exam pt appears euvolemic and initial CXR was clear. AKI is likely progression of CKD 5. Creat today 7's, basically unchanged. UOP is good. No sig uremic signs and no need for acute HD. K+ wnl. Will follow.  - From renal standpoint he can be followed at clinic; no uremic sxs. He already has an appt on the 10th  Anemia: due to renal failure in part. S/P 1u prbc's 11/01, Hb up to 8.2. Tsat 32% and ferritin 131 on labs from 11/2, no need for IV Fe.  HTN: getting home bp meds here, coreg  and hydralazine , per pmd. BP's slightly high.  Volume: looks euvolemic and we are continuing home torsemide  at 20mg  bi8d  Subjective: UOP 1.3 L yest Feeling good today Creat stable in the  7's (his baseline)  Vitals:   04/16/24 0320 04/16/24 0321 04/16/24 0326 04/16/24 0715  BP: (!) 154/71   131/78  Pulse: 72 72    Resp:  18  20  Temp:  98.1 F (36.7 C)  (!) 97.5 F (36.4 C)  TempSrc:    Oral  SpO2: 96% 97%    Weight:   122.7 kg   Height:        Exam: Gen alert, no distress Sclera anicteric, throat clear  No jvd, flat neck veins Chest clear bilat to bases RRR no MRG Abd soft ntnd no mass or ascites +bs Ext trace ankle edema bilat, no other edema Neuro is alert, Ox 3 , nf     Home bp meds: Coreg  25 bid Hydralazine  75mg  tid Isordil  20mg  tid Demadex  40mg  bid   Date                             Creat               eGFR (ml/min) 2012- 2016                  0.80- 1.14 2018- 2019                  1.10- 1.51         2020                            1.24- 2.64 Feb 2021                     1.31 Jan 2020                     2.00- 7.44 Oct 2021                     2.30 May 2020                     1.46 Jan- nov 2022             1.70- 2.84 2023                             2.48- 4.57 Oct 2024  5.23 September 2023                    6.63 July 2025                     6.30- 6.64        10- 11 ml/min 04/14/24                      7.28                 9 ml/min                         UA: pend UNa , UCr pend Renal US : 10.6/ 12.2 cm kidneys w/o hydronephrosis CXR 11/01: no active disease   id.     Recent Labs  Lab 04/14/24 1110 04/14/24 1305 04/15/24 0741  HGB 7.0*  --  8.2*  ALBUMIN  --  3.2*  --   CALCIUM  7.9*  --  7.9*  CREATININE 7.28*  --  7.34*  K 3.5  --  3.6   Recent Labs  Lab 04/15/24 0741  IRON  76  TIBC 237*  FERRITIN 131   Inpatient medications:  atorvastatin   80 mg Oral Daily   carvedilol   25 mg Oral BID WC   ezetimibe   10 mg Oral Daily   hydrALAZINE   100 mg Oral Q8H   isosorbide  dinitrate  20 mg Oral TID   pantoprazole  (PROTONIX ) IV  40 mg Intravenous Q12H   sodium chloride  flush  3 mL Intravenous Q12H   torsemide   20 mg Oral BID    acetaminophen  **OR** acetaminophen , bisacodyl, hydrALAZINE , nitroGLYCERIN , ondansetron  **OR** ondansetron  (ZOFRAN ) IV

## 2024-04-16 NOTE — Plan of Care (Signed)

## 2024-04-16 NOTE — Discharge Summary (Signed)
 Physician Discharge Summary  Patient ID: [name] MRN: [id number] DOB/AGE: 11-03-1985 38 y.o.  Admit date: 04/14/2024 Discharge date: 04/16/2024  Admission Diagnoses:  Discharge Diagnoses:  Principal Problem:   SOB (shortness of breath)   Discharged Condition: stable  Hospital Course:  Patient is a 38 year old male with past medical history significant for chronic kidney disease stage IIIb, coronary artery disease, hypertension, diabetes mellitus, hyperlipidemia, anemia, heart failure with reduced EF and myocardial infarction.  Patient is followed up by the local nephrology team.  Patient was admitted with symptomatic anemia, shortness of breath, possible acute on chronic heart failure with reduced EF and hypertensive urgency.  Patient was transfused 1 unit of packed red blood cells during the hospital stay.  Symptoms have improved significantly.  Hemoglobin has improved from 7 g/dL to 8.2 g/dL.  Patient's blood pressure was significantly elevated during the hospital stay, weight systolic blood pressure in the 180s.  Hydralazine  has been increased from 50 mg p.o. 3 times daily to 100 mg p.o. 3 times daily.  Blood pressure control has improved significantly.  Continue to monitor blood pressure at home, document readings and reviewed with the provider.  Patient will follow-up with primary care provider and nephrology team on discharge.   Symptomatic anemia: - Patient presented with shortness of breath. - Hemoglobin of 7 g/dL on presentation (down from 9 g/dL). - S/p transfusion of 1 unit of packed red blood cells.  Globin improved to 8.2 g/dL. - Shortness of breath has improved. - Vitamin B12 of 275.   -Folate of 9.6. - Anemia is likely multifactorial.  Vitamin B12 deficiency: - Vitamin B12 level of 275. - Start vitamin B12 1000 mcg p.o. once daily. - Follow FOR primary care provider.   Hypertensive urgency: - Systolic blood blood pressure in the 180s. - Increase hydralazine   from 50 mg p.o. 3 times daily to 100 mg p.o. 3 times daily. - Blood pressure control has improved significantly. - Continue to monitor blood pressure at home, document readings and reviewed with the providers.     Possible acute on chronic heart failure with reduced EF: - Last echo revealed EF of 30 to 35%. - Continue torsemide  on discharge. - Continue isosorbide  dinitrate and hydralazine . - Strict I's and O's.   Coronary artery disease: - Negative troponin.   Progressive chronic kidney disease stage V versus acute kidney injury on chronic kidney disease stage V: - Patient follows up with Dr. Dwane. - According to the patient, he plans to proceed with peritoneal dialysis. - Nephrology team is managing.  Consults: nephrology  Significant Diagnostic Studies:  -Hemoglobin of 7 g/dL on presentation.  Prior to discharge, hemoglobin improved to 8.2 g/dL.  -Renal ultrasound was negative for hydronephrosis. -CT head did not reveal any acute intracranial process. - Chest x-ray was negative for cardiopulmonary process. - Cardiac BNP of 701.   Discharge Exam: Blood pressure 131/78, pulse 72, temperature (!) 97.5 F (36.4 C), temperature source Oral, resp. rate 20, height 6' 4 (1.93 m), weight 122.7 kg, SpO2 97%.   Disposition: Discharge disposition: 01-Home or Self Care       Discharge Instructions     Diet - low sodium heart healthy   Complete by: As directed    Diet renal with fluid restriction   Complete by: As directed    Increase activity slowly   Complete by: As directed       Allergies as of 04/16/2024   No Known Allergies      Medication  List     STOP taking these medications    aspirin  EC 81 MG tablet       TAKE these medications    atorvastatin  80 MG tablet Commonly known as: LIPITOR  TAKE 1 TABLET BY MOUTH EVERY DAY   carvedilol  25 MG tablet Commonly known as: COREG  TAKE 1 TABLET BY MOUTH TWICE A DAY IN THE MORNING AND IN THE EVENING    cyanocobalamin 1000 MCG tablet Commonly known as: VITAMIN B12 Take 1 tablet (1,000 mcg total) by mouth daily.   ezetimibe  10 MG tablet Commonly known as: ZETIA  TAKE 1 TABLET BY MOUTH EVERY DAY   hydrALAZINE  100 MG tablet Commonly known as: APRESOLINE  Take 1 tablet (100 mg total) by mouth 3 (three) times daily. What changed:  medication strength See the new instructions.   Insulin  Aspart FlexPen 100 UNIT/ML Commonly known as: NOVOLOG  Inject into the skin 3 (three) times daily.   isosorbide  dinitrate 20 MG tablet Commonly known as: ISORDIL  TAKE 1 TABLET BY MOUTH THREE TIMES A DAY   nitroGLYCERIN  0.4 MG SL tablet Commonly known as: NITROSTAT  Place 1 tablet (0.4 mg total) under the tongue every 5 (five) minutes as needed for chest pain.   pantoprazole  40 MG tablet Commonly known as: PROTONIX  Take 1 tablet (40 mg total) by mouth daily.   torsemide  20 MG tablet Commonly known as: DEMADEX  Take 2 tablets (40 mg total) by mouth 2 (two) times daily.        Follow-up Information     Marvene Prentice SAUNDERS, FNP Follow up in 1 week(s).   Specialty: Family Medicine Contact information: [address] [place] [address] [phone]         Macel Jayson PARAS, MD Follow up in 1 week(s).   Specialty: Nephrology Contact information: [address] [place] [address] [phone]                Time spent: 35 minutes.  SignedBETHA Leatrice LILLETTE Rosario 04/16/2024, 11:05 AM

## 2024-04-16 NOTE — Progress Notes (Signed)
 PHARMACIST - PHYSICIAN COMMUNICATION  DR:   Rosario  CONCERNING: IV to Oral Route Change Policy  RECOMMENDATION: This patient is receiving protonix  by the intravenous route.  Based on criteria approved by the Pharmacy and Therapeutics Committee, the intravenous medication(s) is/are being converted to the equivalent oral dose form(s).   DESCRIPTION: These criteria include: The patient is eating (either orally or via tube) and/or has been taking other orally administered medications for a least 24 hours The patient has no evidence of active gastrointestinal bleeding or impaired GI absorption (gastrectomy, short bowel, patient on TNA or NPO).  If you have questions about this conversion, please contact the Pharmacy Department  []   [phone] )  [facility] []   [phone] )  [facility] [x]   [phone] )  [facility] []   [phone] )  [facility] []   [phone] )  [facility]

## 2024-04-16 NOTE — TOC CM/SW Note (Signed)
 Transition of Care [facility]) - Inpatient Brief Assessment   Patient Details  Name: Domenico Achord MRN: [id number] Date of Birth: 08-07-1985  Transition of Care [facility]) CM/SW Contact:    Waddell Barnie Rama, RN Phone Number: 04/16/2024, 11:05 AM   Clinical Narrative: From home with spouse, has PCP and insurance on file, states has no HH services in place at this time or DME at home.  States family member (wife)  will transport them home at [employer] and family is support system, states gets medications from [facility] on [address].  Pta self ambulatory.   There are no ICM  needs identified  at this time.  Please place consult for ICM  needs.     Transition of Care Asessment: Insurance and Status: Insurance coverage has been reviewed Patient has primary care physician: Yes Home environment has been reviewed: home with wife Prior level of function:: indep Prior/Current Home Services: No current home services Social Drivers of Health Review: SDOH reviewed no interventions necessary Readmission risk has been reviewed: Yes Transition of care needs: no transition of care needs at this time

## 2024-04-16 NOTE — Progress Notes (Incomplete)
 Discharge   Patient expressed verbal understanding of discharge POC.   Patient given time to ask any questions.  Additional education included in AVS.  Alert oriented in good spirits.   Tele and PIV removed by primary RN.   Pressure dressings intact.  All belonging with patient.  Discharged to [facility].

## 2024-04-16 NOTE — Progress Notes (Signed)
 Initial Nutrition Assessment  DOCUMENTATION CODES:   Obesity unspecified  INTERVENTION:  Continue low-sodium diet.   NUTRITION DIAGNOSIS:    (Obesity) related to  (excessive energy intake) as evidenced by  (BMI of >32).  GOAL:   Patient will meet greater than or equal to 90% of their needs  MONITOR:   PO intake, Labs, Weight trends  REASON FOR ASSESSMENT:   Consult Assessment of nutrition requirement/status  ASSESSMENT:   PMH significant for DM2, CKD3b, CAD, MI, HTN, dyslipidemia presented with shortness of breath and found to have acute on chronic heart failure with hypertensive emergency as well as AKI.  Visited the patient with his wife Josefa at bedside. The patient states he is eating well and denies any intake issues prior to admission. His UBW is 275 lbs and he has intentionally tried to lose weight. He follows a low-sodium diet at home due to his renal issues so is familiar with it. He declines ONS.   Scheduled Meds:  atorvastatin   80 mg Oral Daily   carvedilol   25 mg Oral BID WC   ezetimibe   10 mg Oral Daily   hydrALAZINE   100 mg Oral Q8H   isosorbide  dinitrate  20 mg Oral TID   pantoprazole   40 mg Oral BID   sodium chloride  flush  3 mL Intravenous Q12H   torsemide   20 mg Oral BID    Labs:     Latest Ref Rng & Units 04/15/2024    7:41 AM 04/14/2024    1:05 PM 04/14/2024   11:10 AM  CMP  Glucose 70 - 99 mg/dL 78   98   BUN 6 - 20 mg/dL 53   54   Creatinine 9.38 - 1.24 mg/dL 2.65   2.71   Sodium 864 - 145 mmol/L 140   140   Potassium 3.5 - 5.1 mmol/L 3.6   3.5   Chloride 98 - 111 mmol/L 109   107   CO2 22 - 32 mmol/L 19   19   Calcium  8.9 - 10.3 mg/dL 7.9   7.9   Total Protein 6.5 - 8.1 g/dL  6.2    Total Bilirubin 0.0 - 1.2 mg/dL  1.4    Alkaline Phos 38 - 126 U/L  68    AST 15 - 41 U/L  18    ALT 0 - 44 U/L  20      I/O: -2.8 L since admit  Meal Intake: 100%   NUTRITION - FOCUSED PHYSICAL EXAM:  Flowsheet Row Most Recent Value  Orbital  Region No depletion  Upper Arm Region Mild depletion  Thoracic and Lumbar Region No depletion  Buccal Region No depletion  Temple Region No depletion  Clavicle Bone Region No depletion  Clavicle and Acromion Bone Region No depletion  Scapular Bone Region No depletion  Dorsal Hand No depletion  Patellar Region No depletion  Anterior Thigh Region Mild depletion  Posterior Calf Region Unable to assess  [due to edema]  Edema (RD Assessment) Moderate  [+2 BLE]  Hair Reviewed  Eyes Reviewed  Mouth Reviewed  Skin Reviewed  Nails Reviewed    Diet Order             Diet - low sodium heart healthy           Diet renal with fluid restriction           Diet renal/carb modified with fluid restriction Diet-HS Snack? Nothing; Fluid restriction: 1200 mL Fluid; Room service appropriate? Yes; Fluid  consistency: Thin  Diet effective now                   EDUCATION NEEDS:   Education needs have been addressed  Skin:  Skin Assessment: Reviewed RN Assessment  Last BM:  10/31  Height:   Ht Readings from Last 1 Encounters:  04/14/24 6' 4 (1.93 m)    Weight:    Ideal Body Weight:  92 kg  BMI:  Body mass index is 32.91 kg/m.  Estimated Nutritional Needs:  Kcal:  2300-2700 Protein:  125-145 Fluid:  1.2 L fluid restriction noted    Leverne Ruth, MS, RDN, LDN [place]. [facility] See AMION for contact information

## 2024-04-19 ENCOUNTER — Encounter (HOSPITAL_COMMUNITY): Payer: Self-pay

## 2024-04-19 ENCOUNTER — Emergency Department (HOSPITAL_COMMUNITY)

## 2024-04-19 ENCOUNTER — Emergency Department (HOSPITAL_COMMUNITY): Admission: EM | Admit: 2024-04-19 | Discharge: 2024-04-19 | Discharge status: Against Medical Advice

## 2024-04-19 ENCOUNTER — Other Ambulatory Visit: Payer: Self-pay

## 2024-04-19 DIAGNOSIS — Z5321 Procedure and treatment not carried out due to patient leaving prior to being seen by health care provider: Secondary | ICD-10-CM | POA: Insufficient documentation

## 2024-04-19 DIAGNOSIS — R0789 Other chest pain: Secondary | ICD-10-CM | POA: Insufficient documentation

## 2024-04-19 LAB — CBC
HCT: 24.7 % — ABNORMAL LOW (ref 39.0–52.0)
Hemoglobin: 7.7 g/dL — ABNORMAL LOW (ref 13.0–17.0)
MCH: 30 pg (ref 26.0–34.0)
MCHC: 31.2 g/dL (ref 30.0–36.0)
MCV: 96.1 fL (ref 80.0–100.0)
Platelets: 131 K/uL — ABNORMAL LOW (ref 150–400)
RBC: 2.57 MIL/uL — ABNORMAL LOW (ref 4.22–5.81)
RDW: 14.3 % (ref 11.5–15.5)
WBC: 7.4 K/uL (ref 4.0–10.5)
nRBC: 0 % (ref 0.0–0.2)

## 2024-04-19 LAB — TROPONIN I (HIGH SENSITIVITY): Troponin I (High Sensitivity): 15 ng/L (ref <18)

## 2024-04-19 LAB — BASIC METABOLIC PANEL WITH GFR
Anion gap: 14 (ref 5–15)
BUN: 57 mg/dL — ABNORMAL HIGH (ref 6–20)
CO2: 19 mmol/L — ABNORMAL LOW (ref 22–32)
Calcium: 8.4 mg/dL — ABNORMAL LOW (ref 8.9–10.3)
Chloride: 106 mmol/L (ref 98–111)
Creatinine, Ser: 7.24 mg/dL — ABNORMAL HIGH (ref 0.61–1.24)
GFR, Estimated: 9 mL/min — ABNORMAL LOW (ref >60)
Glucose, Bld: 92 mg/dL (ref 70–99)
Potassium: 3.4 mmol/L — ABNORMAL LOW (ref 3.5–5.1)
Sodium: 139 mmol/L (ref 135–145)

## 2024-04-19 NOTE — ED Notes (Signed)
 Pt has been called numerous times with no answer. Pt OTF.

## 2024-04-19 NOTE — ED Triage Notes (Signed)
 Patient states his chest pain started last night. Generalized chest pain he states it radiates to his left arm and his arm goes numb. Denies radiation into back or neck or jaw. He states the pain feels like pressure. Denies shob, nausea. States he gets dizzy intermittently and has been weak.   Patient states he had a stent placed 4 years ago for a blockage. He states he had a blood transfusion here a few days ago and he has stage 5 CKD. He does not take dialysis, he still makes urine.

## 2024-04-19 NOTE — ED Triage Notes (Signed)
 Pt is here for generalized chest pain which began last night and radiates to his left arm, pt describes the pain as pressure.  No SOB.  PT was seen for same a few days ago.

## 2024-04-25 ENCOUNTER — Other Ambulatory Visit (HOSPITAL_COMMUNITY): Payer: Self-pay | Admitting: Internal Medicine

## 2024-04-25 DIAGNOSIS — D631 Anemia in chronic kidney disease: Secondary | ICD-10-CM | POA: Insufficient documentation

## 2024-05-07 ENCOUNTER — Other Ambulatory Visit: Payer: Self-pay | Admitting: Internal Medicine

## 2024-05-14 MED ORDER — CARVEDILOL 25 MG PO TABS: ORAL_TABLET | ORAL | 1 refills | Status: AC

## 2024-05-17 ENCOUNTER — Other Ambulatory Visit (HOSPITAL_COMMUNITY): Payer: Self-pay | Admitting: Pharmacist

## 2024-05-17 ENCOUNTER — Encounter (HOSPITAL_COMMUNITY)
Admission: RE | Admit: 2024-05-17 | Discharge: 2024-05-17 | Disposition: A | Discharge status: Home / Self Care | Source: Ambulatory Visit | Attending: Internal Medicine | Admitting: Internal Medicine

## 2024-05-17 VITALS — BP 150/78 | HR 72 | Temp 97.7°F | Resp 16

## 2024-05-17 DIAGNOSIS — N185 Chronic kidney disease, stage 5: Secondary | ICD-10-CM | POA: Diagnosis present

## 2024-05-17 DIAGNOSIS — D631 Anemia in chronic kidney disease: Secondary | ICD-10-CM | POA: Insufficient documentation

## 2024-05-17 LAB — POCT HEMOGLOBIN-HEMACUE: Hemoglobin: 6.6 g/dL — CL (ref 13.0–17.0)

## 2024-05-17 MED ORDER — EPOETIN ALFA-EPBX 20000 UNIT/ML IJ SOLN
INTRAMUSCULAR | Status: AC
  Filled 2024-05-17: qty 1

## 2024-05-17 MED ORDER — EPOETIN ALFA-EPBX 10000 UNIT/ML IJ SOLN
INTRAMUSCULAR | Status: AC
  Filled 2024-05-17: qty 1

## 2024-05-17 MED ORDER — EPOETIN ALFA-EPBX 20000 UNIT/ML IJ SOLN
20000.0000 [IU] | Freq: Once | INTRAMUSCULAR | Status: AC
  Administered 2024-05-17: 20000 [IU] via SUBCUTANEOUS

## 2024-05-17 MED ORDER — EPOETIN ALFA-EPBX 10000 UNIT/ML IJ SOLN
10000.0000 [IU] | Freq: Once | INTRAMUSCULAR | Status: AC
  Administered 2024-05-17: 10000 [IU] via SUBCUTANEOUS

## 2024-05-17 NOTE — Progress Notes (Signed)
 Pt here to restart Retacrit  injections. HGB 6.6 via hemocue.  No c/o shortness of breath, fatigue, bleeding and no dark stools.  VSS.  Dr Tobie notified and new orders received to increase Retacrit  to 30000u every 2 weeks.  PT notified of changes

## 2024-05-18 ENCOUNTER — Telehealth (HOSPITAL_COMMUNITY): Payer: Self-pay | Admitting: Pharmacy Technician

## 2024-05-18 NOTE — Telephone Encounter (Signed)
 Auth Submission: NO AUTH NEEDED Site of care: [facility] Payer: [employer] Medication & CPT/J Code(s) submitted: Q5106 - PR INJ RETACRIT  NON-ESRD USE Diagnosis Code: N18.5, D63.1 Route of submission (phone, fax, portal):  Phone # Fax # Auth type: Buy/Bill HB Units/visits requested: 40000 units every 2 weeks Reference number:  Approval from: 05/18/2024 to 08/11/24    Dagoberto Armour, CPhT [facility] Phone: [phone] 05/18/2024

## 2024-05-27 ENCOUNTER — Other Ambulatory Visit: Payer: Self-pay

## 2024-05-27 ENCOUNTER — Encounter (HOSPITAL_COMMUNITY): Payer: Self-pay

## 2024-05-27 ENCOUNTER — Emergency Department (HOSPITAL_COMMUNITY)

## 2024-05-27 ENCOUNTER — Emergency Department (HOSPITAL_COMMUNITY)
Admission: EM | Admit: 2024-05-27 | Discharge: 2024-05-27 | Disposition: A | Discharge status: Home / Self Care | Attending: Emergency Medicine | Admitting: Emergency Medicine

## 2024-05-27 DIAGNOSIS — R0600 Dyspnea, unspecified: Secondary | ICD-10-CM

## 2024-05-27 DIAGNOSIS — R079 Chest pain, unspecified: Secondary | ICD-10-CM

## 2024-05-27 DIAGNOSIS — D649 Anemia, unspecified: Secondary | ICD-10-CM

## 2024-05-27 LAB — COMPREHENSIVE METABOLIC PANEL WITH GFR
ALT: 12 U/L (ref 0–44)
AST: 12 U/L — ABNORMAL LOW (ref 15–41)
Albumin: 3.4 g/dL — ABNORMAL LOW (ref 3.5–5.0)
Alkaline Phosphatase: 77 U/L (ref 38–126)
Anion gap: 12 (ref 5–15)
BUN: 49 mg/dL — ABNORMAL HIGH (ref 6–20)
CO2: 19 mmol/L — ABNORMAL LOW (ref 22–32)
Calcium: 8 mg/dL — ABNORMAL LOW (ref 8.9–10.3)
Chloride: 108 mmol/L (ref 98–111)
Creatinine, Ser: 7.74 mg/dL — ABNORMAL HIGH (ref 0.61–1.24)
GFR, Estimated: 8 mL/min — ABNORMAL LOW (ref >60)
Glucose, Bld: 111 mg/dL — ABNORMAL HIGH (ref 70–99)
Potassium: 3.7 mmol/L (ref 3.5–5.1)
Sodium: 139 mmol/L (ref 135–145)
Total Bilirubin: 0.9 mg/dL (ref 0.0–1.2)
Total Protein: 6.4 g/dL — ABNORMAL LOW (ref 6.5–8.1)

## 2024-05-27 LAB — BRAIN NATRIURETIC PEPTIDE: B Natriuretic Peptide: 620.5 pg/mL — ABNORMAL HIGH (ref 0.0–100.0)

## 2024-05-27 LAB — CBC WITH DIFFERENTIAL/PLATELET
Abs Immature Granulocytes: 0.02 K/uL (ref 0.00–0.07)
Basophils Absolute: 0 K/uL (ref 0.0–0.1)
Basophils Relative: 1 %
Eosinophils Absolute: 0.1 K/uL (ref 0.0–0.5)
Eosinophils Relative: 2 %
HCT: 23.7 % — ABNORMAL LOW (ref 39.0–52.0)
Hemoglobin: 7.1 g/dL — ABNORMAL LOW (ref 13.0–17.0)
Immature Granulocytes: 0 %
Lymphocytes Relative: 15 %
Lymphs Abs: 0.8 K/uL (ref 0.7–4.0)
MCH: 30.2 pg (ref 26.0–34.0)
MCHC: 30 g/dL (ref 30.0–36.0)
MCV: 100.9 fL — ABNORMAL HIGH (ref 80.0–100.0)
Monocytes Absolute: 0.5 K/uL (ref 0.1–1.0)
Monocytes Relative: 9 %
Neutro Abs: 4.2 K/uL (ref 1.7–7.7)
Neutrophils Relative %: 73 %
Platelets: 124 K/uL — ABNORMAL LOW (ref 150–400)
RBC: 2.35 MIL/uL — ABNORMAL LOW (ref 4.22–5.81)
RDW: 15.1 % (ref 11.5–15.5)
WBC: 5.7 K/uL (ref 4.0–10.5)
nRBC: 0 % (ref 0.0–0.2)

## 2024-05-27 LAB — TROPONIN I (HIGH SENSITIVITY)
Troponin I (High Sensitivity): 13 ng/L (ref <18)
Troponin I (High Sensitivity): 14 ng/L (ref <18)

## 2024-05-27 LAB — PREPARE RBC (CROSSMATCH)

## 2024-05-27 MED ORDER — SODIUM CHLORIDE 0.9% IV SOLUTION: Freq: Once | INTRAVENOUS | Status: DC

## 2024-05-27 NOTE — ED Provider Notes (Signed)
 [place] EMERGENCY DEPARTMENT AT [facility] Provider Note   CSN: [id number] Arrival date & time: 05/27/24  1339     Patient presents with: Anemia   [name] is Richardson 38 y.o. male who presents to the emergency department with chief complaint of anemia as well as intermittent chest pain and shortness of breath.  Patient currently denies chest pain or shortness of breath while in the ED.  Patient is Richardson chronic kidney disease patient who is receiving erythropoietin injections to try and help with anemia.  Patient is following with nephrology for this.  Patient received his last injection last week and labs were drawn at this visit, he was notified by his and his nephrologist that his hemoglobin was 6.6, and his nephrologist recommended he come into the emergency department for Richardson blood transfusion.  Patient is not on dialysis and still makes urine.  Has medical history significant for hypertension, type 2 diabetes requiring insulin , coronary artery disease, heart failure with reduced ejection fraction, hyperlipidemia, obesity, MI, ischemic cardiomyopathy, etc.  Patient states that he has been compliant with his outpatient medications.  States that he does not regularly keep track of his weight at home.  Denies fever, chills, or other infectious symptoms.  Denies blood in urine or stool.  Denies pain currently.     Anemia Associated symptoms include shortness of breath.       Prior to Admission medications  Medication Sig Start Date End Date Taking? Authorizing Provider  atorvastatin  (LIPITOR ) 80 MG tablet TAKE 1 TABLET BY MOUTH EVERY DAY 03/20/24   Greg Hamilton T, PA-C  carvedilol  (COREG ) 25 MG tablet TAKE 1 TABLET BY MOUTH TWICE Richardson DAY IN THE MORNING AND IN THE EVENING 05/14/24   Chandrasekhar, Mahesh A, MD  cyanocobalamin  (VITAMIN B12) 1000 MCG tablet Take 1 tablet (1,000 mcg total) by mouth daily. 04/16/24   Greg Eland I, MD  ezetimibe  (ZETIA ) 10 MG tablet TAKE 1 TABLET  BY MOUTH EVERY DAY 03/20/24   Greg Hamilton T, PA-C  hydrALAZINE  (APRESOLINE ) 100 MG tablet Take 1 tablet (100 mg total) by mouth 3 (three) times daily. 04/16/24   Greg Eland FERNS, MD  Insulin  Aspart FlexPen (NOVOLOG ) 100 UNIT/ML Inject into the skin 3 (three) times daily. 03/29/24   [provider]  isosorbide  dinitrate (ISORDIL ) 20 MG tablet TAKE 1 TABLET BY MOUTH THREE TIMES Richardson DAY 05/06/23   Chandrasekhar, Mahesh A, MD  nitroGLYCERIN  (NITROSTAT ) 0.4 MG SL tablet Place 1 tablet (0.4 mg total) under the tongue every 5 (five) minutes as needed for chest pain. 02/02/21 01/08/24  Greg Candyce RAMAN, MD  pantoprazole  (PROTONIX ) 40 MG tablet Take 1 tablet (40 mg total) by mouth daily. 04/16/24   Greg Eland FERNS, MD  torsemide  (DEMADEX ) 20 MG tablet Take 2 tablets (40 mg total) by mouth 2 (two) times daily. 01/11/24   Ghimire, Donalda HERO, MD    Allergies: Patient has no known allergies.    Review of Systems  Respiratory:  Positive for shortness of breath.     Updated Vital Signs BP (!) 174/91   Pulse 71   Temp 98.1 F (36.7 C)   Resp 16   SpO2 100%   Physical Exam Vitals and nursing note reviewed.  Constitutional:      General: He is awake. He is not in acute distress.    Appearance: Normal appearance. He is not ill-appearing, toxic-appearing or diaphoretic.  HENT:     Head: Normocephalic and atraumatic.  Eyes:  General: No scleral icterus. Neck:     Comments: No JVD Cardiovascular:     Rate and Rhythm: Normal rate and regular rhythm.  Pulmonary:     Effort: Pulmonary effort is normal. No respiratory distress.     Breath sounds: No wheezing, rhonchi or rales.     Comments: Patient talking in full sentences on room air Musculoskeletal:        General: Normal range of motion.     Right lower leg: Edema present.     Left lower leg: Edema present.     Comments: Grossly normal range of motion of all 4 extremities  Skin:    General: Skin is warm.     Capillary  Refill: Capillary refill takes less than 2 seconds.  Neurological:     General: No focal deficit present.     Mental Status: He is alert and oriented to person, place, and time.  Psychiatric:        Mood and Affect: Mood normal.        Behavior: Behavior normal. Behavior is cooperative.     (all labs ordered are listed, but only abnormal results are displayed) Labs Reviewed  CBC WITH DIFFERENTIAL/PLATELET - Abnormal; Notable for the following components:      Result Value   RBC 2.35 (*)    Hemoglobin 7.1 (*)    HCT 23.7 (*)    MCV 100.9 (*)    Platelets 124 (*)    All other components within normal limits  COMPREHENSIVE METABOLIC PANEL WITH GFR - Abnormal; Notable for the following components:   CO2 19 (*)    Glucose, Bld 111 (*)    BUN 49 (*)    Creatinine, Ser 7.74 (*)    Calcium  8.0 (*)    Total Protein 6.4 (*)    Albumin 3.4 (*)    AST 12 (*)    GFR, Estimated 8 (*)    All other components within normal limits  BRAIN NATRIURETIC PEPTIDE - Abnormal; Notable for the following components:   B Natriuretic Peptide 620.5 (*)    All other components within normal limits  TYPE AND SCREEN  PREPARE RBC (CROSSMATCH)  TROPONIN Richardson (HIGH SENSITIVITY)    EKG: EKG Interpretation Date/Time:  Sunday May 27 2024 14:13:21 EST Ventricular Rate:  67 PR Interval:  162 QRS Duration:  92 QT Interval:  452 QTC Calculation: 477 R Axis:   97  Text Interpretation: Normal sinus rhythm Rightward axis When compared with ECG of 19-Apr-2024 17:57, PREVIOUS ECG IS PRESENT Confirmed by [name] [phone]) on 05/27/2024 3:20:24 PM  Radiology: DG Chest 2 View Result Date: 05/27/2024 CLINICAL DATA:  shortness of breath EXAM: CHEST - 2 VIEW COMPARISON:  January 08, 2024 FINDINGS: The cardiomediastinal silhouette is unchanged and massively enlarged in contour. No pleural effusion. No pneumothorax. No acute pleuroparenchymal abnormality. Visualized abdomen is unremarkable. No acute osseous  abnormality. IMPRESSION: Cardiomegaly. Electronically Signed   By: [name].D.   On: 05/27/2024 15:19     Procedures   Medications Ordered in the ED  0.9 %  sodium chloride  infusion (Manually program via Guardrails IV Fluids) (has no administration in time range)    Clinical Course as of 05/27/24 1612  Sun May 27, 2024  1535 B Natriuretic Peptide(!): 620.5 Improved from 620 14-month prior [AY]    Clinical Course User Index [AY] Neysa Thersia RAMAN, PA-C  Medical Decision Making Amount and/or Complexity of Data Reviewed Labs: ordered. Decision-making details documented in ED Course. Radiology: ordered.  Risk Prescription drug management.   Patient presents to the ED for concern of anemia, chest pain, shortness of breath, this involves an extensive number of treatment options, and is Richardson complaint that carries with it Richardson high risk of complications and morbidity.  The differential diagnosis includes acute blood loss, anemia due to chronic kidney disease, pneumothorax,, ACS, pericarditis, etc.   Co morbidities that complicate the patient evaluation  hypertension, type 2 diabetes requiring insulin , coronary artery disease, heart failure with reduced ejection fraction, hyperlipidemia, obesity, MI, ischemic cardiomyopathy   Additional history obtained:  Reviewed most recent medication injection note from 05/17/24 where patient was receiving Retacrit  injection Briefly reviewed admission from 5 months ago where patient was admitted for chest pain among other findings Last heart cath was in 2021 which was at time of MI per chart review showing Richardson proximal LAD that is 100% stenosed and stent was placed   Lab Tests:  Richardson Ordered, and personally interpreted labs.  The pertinent results include: CBC significant for hemoglobin of 7.1 with microcytic type pattern, BNP 620.5, troponin pending, CMP significant for creatinine of 7.74, potassium 3.7, BUN 49,  GFR 8, type and screen performed and patient is O- blood with negative antibody screen   Imaging Studies ordered:  Richardson ordered imaging studies including chest x-ray Richardson independently visualized and interpreted imaging which showed cardiomegaly Richardson agree with the radiologist interpretation   Cardiac Monitoring:  The patient was maintained on Richardson cardiac monitor.  Richardson personally viewed and interpreted the cardiac monitored which showed an underlying rhythm of: Sinus rhythm    Medicines ordered and prescription drug management:  Richardson ordered medication including blood for anemia  Test Considered:  None   Critical Interventions:  None   Problem List / ED Course:  38 year old male, vital signs stable, presents emergency department due to anemia, anemia appears to be chronic in nature with history of CKD, sent over by nephrology for hemoglobin of 6.6, patient has also had some intermittent chest pain and shortness of breath On physical examination patient overall well-appearing with no acute complaint, denies chest pain or shortness of breath currently, patient does have bilateral edema present however states that this is his baseline Will obtain labs to assess for anemia as well as imaging to assess for fluid overload as well as intrathoracic abnormality Chest x-ray reassuring only showing cardiomegaly, no sign of significant fluid overload, BNP improved from previous, CBC significant for hemoglobin of 7.1 with Richardson microcytic anemia pattern, CMP consistent with known ESRD, potassium 3.7 Based off of possible symptomatic anemia type symptoms as well as patient being sent over from nephrology, will transfuse 1 unit of PRBCs in the emergency department and reassess, will also obtain troponin which is pending at time of my signout, no acute changes to suggest ischemia or MI on EKG which has been obtained Patient signed out to oncoming PA Thersia Salt who assumes care over this patient including further  work-up and disposition.  Plan is currently for transfusion and discharge pending troponin and blood transfusion. Could consider admission for this patient in the setting of chest pain as he is Richardson high risk patient, however this was discussed with patient prior to my shift end and patient would like to go home, Richardson am okay with this plan as long as his troponin's are flat as he was previously admitted to the hospital approximately  5 months ago for nonspecific chest pain, he also states that he is compliant with his outpatient appointments with his cardiologist. Patient is also not currently having chest pain or shortness of breath.    Reevaluation:  Patient signed out to oncoming PA Thersia Salt who assumes care over this patient including further work-up and disposition.  Plan is currently for transfusion and discharge pending troponin and blood transfusion.   Social Determinants of Health:  none   Dispostion:  Patient signed out to oncoming PA-C Thersia Salt who assumes care over this patient including further work-up and disposition.  Plan is currently for transfusion and discharge pending troponin and blood transfusion.     Final diagnoses:  Symptomatic anemia  Chest pain, unspecified type  Dyspnea, unspecified type    ED Discharge Orders     None          Janetta Terrall FALCON, [place] 05/27/24 1614    Freddi Hamilton, MD 05/28/24 1652

## 2024-05-27 NOTE — ED Provider Notes (Signed)
 Signout from [employer], PA-C at shift change. Briefly, patient presents for low hemoglobin.  States seen by nephrologist who was told to come to the ED for a blood transfusion as his hemoglobin was 6.6.  Notes he has had intermittent shortness of breath as well as intermittent chest pain for several weeks but currently no symptoms.  Denies dizziness, lightheadedness, syncope.  No hematochezia or hematemesis.   Plan: Repeat labs today show hemoglobin of 7.1.  Creatinine stable compared to baseline at 7.74.  Followed closely by nephrology.  Will plan for 1 unit packed red blood cells infusion and reevaluate.   7:52 PM Reassessment performed. Patient appears well-appearing resting in the bed with continued stable vital signs  Labs and imaging personally reviewed and interpreted including: Hemoglobin noted to be 7.1.  Otherwise labs chronically elevated and stable compared to previous.  Troponin initial and 1 hour stable at 13 and 14.  BNP slightly elevated at 620 but this is similar to previous as well.   Reviewed additional pertinent lab work and imaging with patient at bedside including: Patient understands low hemoglobin and is agreeable to blood transfusion today.   Most current vital signs reviewed and are as follows: BP (!) 186/98   Pulse 72   Temp 98.2 F (36.8 C) (Oral)   Resp 15   SpO2 100%     Plan: Patient transfused 1 unit of packed red blood cells while in ED.  Continue to remain stable with no acute changes.  Stable for discharge home with outpatient nephrology follow-up    Return and follow-up instructions: Encouraged return to ED with worsening symptoms including dizziness, syncope, severe weakness, chest pain, difficulty breathing.. Encouraged patient to follow-up with their provider in 7 days for hemoglobin recheck. Patient verbalized understanding and agreed with plan.        Neysa Thersia RAMAN, PA-C 05/27/24 1952    Cottie Donnice PARAS, MD 05/27/24 2153

## 2024-05-27 NOTE — Discharge Instructions (Signed)
 Please call nephrologist to schedule follow-up appointment.  You at least need your hemoglobin checked again in the next week, your primary care doctor can do this as well.  Return to ED for any worsening symptoms including severe weakness, near syncopal episode, chest pain, difficulty breathing.

## 2024-05-27 NOTE — ED Provider Triage Note (Signed)
 Emergency Medicine Provider Triage Evaluation Note  [name] , a 38 y.o. male  was evaluated in triage.  Pt complains of low hemoglobin.  Patient had his blood drawn by his nephrologist, who called him and told him his hemoglobin was 6.6 and he needed to come to the emergency department for a blood transfusion.  Patient does report shortness of breath as well as intermittent chest pain.  He denies any dizziness or lightheadedness.  Review of Systems  Positive: Shortness of breath Negative: Dizziness  Physical Exam  BP (!) 150/80   Pulse 70   Temp 97.9 F (36.6 C)   Resp 18   SpO2 95%  Gen:   Awake, no distress   Resp:  Normal effort, able to speak in full sentences MSK:   Moves extremities without difficulty  Other:    Medical Decision Making  Medically screening exam initiated at 2:01 PM.  Appropriate orders placed.  [name] was informed that the remainder of the evaluation will be completed by another provider, this initial triage assessment does not replace that evaluation, and the importance of remaining in the ED until their evaluation is complete.  Labs ordered.  Charge nurse and MD aware patient needs a room.   Greg Marry RAMAN, PA-C 05/27/24 1404

## 2024-05-27 NOTE — ED Triage Notes (Signed)
 Pt was told by nephrologist H&H 6.6 and was told to come here to get blood transfusion. Pt c/o slight SOB. Pt able to speak in complete sentences. PT denies weakness or dizziness.

## 2024-05-28 LAB — TYPE AND SCREEN
ABO/RH(D): O NEG
Antibody Screen: NEGATIVE
Unit division: 0

## 2024-05-28 LAB — BPAM RBC
Blood Product Expiration Date: 202512312359
ISSUE DATE / TIME: 202512141600
Unit Type and Rh: 9500

## 2024-05-31 ENCOUNTER — Inpatient Hospital Stay (HOSPITAL_COMMUNITY): Admission: RE | Admit: 2024-05-31 | Source: Ambulatory Visit

## 2024-06-04 ENCOUNTER — Inpatient Hospital Stay (HOSPITAL_COMMUNITY): Admission: RE | Admit: 2024-06-04 | Discharge: 2024-06-04 | Attending: Internal Medicine | Admitting: Internal Medicine

## 2024-06-04 VITALS — BP 177/88 | HR 80 | Resp 18

## 2024-06-04 DIAGNOSIS — N185 Chronic kidney disease, stage 5: Secondary | ICD-10-CM | POA: Diagnosis not present

## 2024-06-04 DIAGNOSIS — D631 Anemia in chronic kidney disease: Secondary | ICD-10-CM

## 2024-06-04 LAB — POCT HEMOGLOBIN-HEMACUE: Hemoglobin: 7.9 g/dL — ABNORMAL LOW (ref 13.0–17.0)

## 2024-06-04 MED ORDER — EPOETIN ALFA-EPBX 40000 UNIT/ML IJ SOLN
INTRAMUSCULAR | Status: AC
  Filled 2024-06-04: qty 1

## 2024-06-04 MED ORDER — EPOETIN ALFA-EPBX 40000 UNIT/ML IJ SOLN
40000.0000 [IU] | Freq: Once | INTRAMUSCULAR | Status: AC
  Administered 2024-06-04: 40000 [IU] via SUBCUTANEOUS

## 2024-06-14 ENCOUNTER — Encounter (HOSPITAL_COMMUNITY): Payer: Self-pay | Admitting: Internal Medicine

## 2024-06-15 ENCOUNTER — Encounter (HOSPITAL_COMMUNITY)

## 2024-06-18 ENCOUNTER — Inpatient Hospital Stay (HOSPITAL_COMMUNITY): Admission: RE | Admit: 2024-06-18 | Source: Ambulatory Visit

## 2024-06-21 ENCOUNTER — Encounter (HOSPITAL_COMMUNITY): Payer: Self-pay | Admitting: Internal Medicine

## 2024-06-22 ENCOUNTER — Encounter (HOSPITAL_COMMUNITY)
Admission: RE | Admit: 2024-06-22 | Discharge: 2024-06-22 | Disposition: A | Discharge status: Home / Self Care | Payer: Self-pay | Source: Ambulatory Visit | Attending: Internal Medicine | Admitting: Internal Medicine

## 2024-06-22 VITALS — BP 137/75 | HR 70 | Temp 97.0°F | Resp 17

## 2024-06-22 DIAGNOSIS — N185 Chronic kidney disease, stage 5: Secondary | ICD-10-CM | POA: Diagnosis present

## 2024-06-22 DIAGNOSIS — D631 Anemia in chronic kidney disease: Secondary | ICD-10-CM | POA: Insufficient documentation

## 2024-06-22 LAB — IRON AND TIBC
Iron: 25 ug/dL — ABNORMAL LOW (ref 45–182)
Saturation Ratios: 10 % — ABNORMAL LOW (ref 17.9–39.5)
TIBC: 244 ug/dL — ABNORMAL LOW (ref 250–450)
UIBC: 219 ug/dL

## 2024-06-22 LAB — FERRITIN: Ferritin: 151 ng/mL (ref 24–336)

## 2024-06-22 LAB — POCT HEMOGLOBIN-HEMACUE: Hemoglobin: 7.3 g/dL — ABNORMAL LOW (ref 13.0–17.0)

## 2024-06-22 MED ORDER — EPOETIN ALFA-EPBX 40000 UNIT/ML IJ SOLN
INTRAMUSCULAR | Status: AC
  Filled 2024-06-22: qty 1

## 2024-06-22 MED ORDER — EPOETIN ALFA-EPBX 40000 UNIT/ML IJ SOLN
40000.0000 [IU] | Freq: Once | INTRAMUSCULAR | Status: AC
  Administered 2024-06-22: 40000 [IU] via SUBCUTANEOUS

## 2024-06-24 ENCOUNTER — Other Ambulatory Visit: Payer: Self-pay | Admitting: Internal Medicine

## 2024-06-27 ENCOUNTER — Other Ambulatory Visit (HOSPITAL_COMMUNITY): Payer: Self-pay | Admitting: Internal Medicine

## 2024-06-27 ENCOUNTER — Telehealth (HOSPITAL_COMMUNITY): Payer: Self-pay | Admitting: Pharmacy Technician

## 2024-06-27 NOTE — Telephone Encounter (Signed)
 Auth Submission: NO AUTH NEEDED Site of care: [facility] Payer: [employer] Medication & CPT/J Code(s) submitted: Venofer  (Iron  Sucrose) J1756 Diagnosis Code: N18.5, D63.1 Route of submission (phone, fax, portal):  Phone # Fax # Auth type: Buy/Bill HB Units/visits requested: 200MG  X 5 DOSES Reference number:  Approval from: 06/27/2024 to 08/11/24 (coverage ends per portal)    Dagoberto Armour, CPhT [facility] Phone: [phone] 06/27/2024

## 2024-07-02 ENCOUNTER — Inpatient Hospital Stay (HOSPITAL_COMMUNITY): Admission: RE | Admit: 2024-07-02 | Source: Ambulatory Visit

## 2024-07-04 ENCOUNTER — Encounter (HOSPITAL_COMMUNITY)

## 2024-07-04 ENCOUNTER — Inpatient Hospital Stay (HOSPITAL_COMMUNITY)
Admission: RE | Admit: 2024-07-04 | Discharge: 2024-07-04 | Disposition: A | Source: Ambulatory Visit | Attending: Internal Medicine | Admitting: Internal Medicine

## 2024-07-04 ENCOUNTER — Ambulatory Visit (HOSPITAL_COMMUNITY)
Admission: RE | Admit: 2024-07-04 | Discharge: 2024-07-04 | Disposition: A | Discharge status: Home / Self Care | Payer: Self-pay | Source: Ambulatory Visit | Attending: Internal Medicine | Admitting: Internal Medicine

## 2024-07-04 VITALS — BP 119/65 | HR 65 | Temp 98.3°F | Resp 15

## 2024-07-04 DIAGNOSIS — D631 Anemia in chronic kidney disease: Secondary | ICD-10-CM | POA: Diagnosis not present

## 2024-07-04 DIAGNOSIS — N185 Chronic kidney disease, stage 5: Secondary | ICD-10-CM | POA: Insufficient documentation

## 2024-07-04 LAB — POCT HEMOGLOBIN-HEMACUE: Hemoglobin: 7.6 g/dL — ABNORMAL LOW (ref 13.0–17.0)

## 2024-07-04 MED ORDER — EPOETIN ALFA-EPBX 40000 UNIT/ML IJ SOLN
40000.0000 [IU] | Freq: Once | INTRAMUSCULAR | Status: AC
  Administered 2024-07-04: 40000 [IU] via SUBCUTANEOUS

## 2024-07-04 MED ORDER — IRON SUCROSE 200 MG IVPB - SIMPLE MED
200.0000 mg | Freq: Once | Status: AC
  Administered 2024-07-04: 200 mg via INTRAVENOUS

## 2024-07-04 MED ORDER — EPOETIN ALFA-EPBX 40000 UNIT/ML IJ SOLN
INTRAMUSCULAR | Status: AC
  Filled 2024-07-04: qty 1

## 2024-07-04 MED ORDER — IRON SUCROSE 200 MG IVPB - SIMPLE MED
Status: AC
  Filled 2024-07-04: qty 110

## 2024-07-06 ENCOUNTER — Inpatient Hospital Stay (HOSPITAL_COMMUNITY): Admission: RE | Admit: 2024-07-06 | Payer: Self-pay | Source: Ambulatory Visit

## 2024-07-11 ENCOUNTER — Ambulatory Visit (HOSPITAL_COMMUNITY)
Admission: RE | Admit: 2024-07-11 | Discharge: 2024-07-11 | Disposition: A | Source: Ambulatory Visit | Attending: Internal Medicine | Admitting: Internal Medicine

## 2024-07-11 VITALS — BP 150/75 | HR 68 | Temp 98.0°F | Resp 16

## 2024-07-11 DIAGNOSIS — N185 Chronic kidney disease, stage 5: Secondary | ICD-10-CM | POA: Diagnosis not present

## 2024-07-11 DIAGNOSIS — D631 Anemia in chronic kidney disease: Secondary | ICD-10-CM

## 2024-07-11 MED ORDER — IRON SUCROSE 200 MG IVPB - SIMPLE MED
Status: AC
  Filled 2024-07-11: qty 110

## 2024-07-11 MED ORDER — IRON SUCROSE 200 MG IVPB - SIMPLE MED
200.0000 mg | Freq: Once | Status: AC
  Administered 2024-07-11: 200 mg via INTRAVENOUS

## 2024-07-18 ENCOUNTER — Encounter (HOSPITAL_COMMUNITY)
Admission: RE | Admit: 2024-07-18 | Discharge: 2024-07-18 | Disposition: A | Source: Ambulatory Visit | Attending: Internal Medicine | Admitting: Internal Medicine

## 2024-07-18 ENCOUNTER — Inpatient Hospital Stay (HOSPITAL_COMMUNITY)
Admission: RE | Admit: 2024-07-18 | Discharge: 2024-07-18 | Disposition: A | Source: Ambulatory Visit | Attending: Internal Medicine | Admitting: Internal Medicine

## 2024-07-18 VITALS — BP 164/95 | HR 67 | Temp 98.3°F | Resp 18 | Ht 76.0 in | Wt 270.0 lb

## 2024-07-18 DIAGNOSIS — D631 Anemia in chronic kidney disease: Secondary | ICD-10-CM

## 2024-07-18 LAB — POCT HEMOGLOBIN-HEMACUE: Hemoglobin: 7 g/dL — ABNORMAL LOW (ref 13.0–17.0)

## 2024-07-18 MED ORDER — IRON SUCROSE 200 MG IVPB - SIMPLE MED
200.0000 mg | Freq: Once | Status: AC
  Administered 2024-07-18: 200 mg via INTRAVENOUS

## 2024-07-18 MED ORDER — EPOETIN ALFA-EPBX 40000 UNIT/ML IJ SOLN
INTRAMUSCULAR | Status: AC
  Filled 2024-07-18: qty 1

## 2024-07-18 MED ORDER — EPOETIN ALFA-EPBX 40000 UNIT/ML IJ SOLN
40000.0000 [IU] | Freq: Once | INTRAMUSCULAR | Status: AC
  Administered 2024-07-18: 40000 [IU] via SUBCUTANEOUS

## 2024-07-18 MED ORDER — IRON SUCROSE 200 MG IVPB - SIMPLE MED
Status: AC
  Filled 2024-07-18: qty 110

## 2024-07-18 NOTE — Progress Notes (Signed)
 Hemocue 7, down from 7.3 2 weeks ago. Called [facility], spoke with [facility] who spoke to Dr. Macel. Per him, no changes since patient starting peritoneal dialysis next week and reports no new or worsening symptoms. IV Venofer  and Retacrit  given as previously ordered. Pt verbalized understanding.

## 2024-07-19 ENCOUNTER — Other Ambulatory Visit: Payer: Self-pay | Admitting: Internal Medicine

## 2024-07-25 ENCOUNTER — Encounter (HOSPITAL_COMMUNITY)

## 2024-08-01 ENCOUNTER — Ambulatory Visit (HOSPITAL_COMMUNITY)

## 2024-08-01 ENCOUNTER — Encounter (HOSPITAL_COMMUNITY)
